# Patient Record
Sex: Female | Born: 1945
Health system: Southern US, Community
[De-identification: ages and names within clinical notes are randomized; demographics above are authoritative.]

## PROBLEM LIST (undated history)

## (undated) DIAGNOSIS — R112 Nausea with vomiting, unspecified: Secondary | ICD-10-CM

## (undated) DIAGNOSIS — F32A Depression, unspecified: Secondary | ICD-10-CM

## (undated) DIAGNOSIS — Z9889 Other specified postprocedural states: Secondary | ICD-10-CM

## (undated) DIAGNOSIS — I10 Essential (primary) hypertension: Secondary | ICD-10-CM

## (undated) DIAGNOSIS — Z973 Presence of spectacles and contact lenses: Secondary | ICD-10-CM

## (undated) DIAGNOSIS — M1990 Unspecified osteoarthritis, unspecified site: Secondary | ICD-10-CM

## (undated) DIAGNOSIS — F329 Major depressive disorder, single episode, unspecified: Secondary | ICD-10-CM

## (undated) DIAGNOSIS — K5790 Diverticulosis of intestine, part unspecified, without perforation or abscess without bleeding: Secondary | ICD-10-CM

## (undated) HISTORY — PX: CARPAL TUNNEL RELEASE: SHX101

## (undated) HISTORY — PX: KNEE ARTHROSCOPY: SUR90

---

## 1975-05-12 HISTORY — PX: DILATION AND CURETTAGE OF UTERUS: SHX78

## 1975-05-12 HISTORY — PX: TUBAL LIGATION: SHX77

## 1998-07-31 ENCOUNTER — Other Ambulatory Visit: Admission: RE | Admit: 1998-07-31 | Discharge: 1998-07-31 | Payer: Self-pay | Admitting: Obstetrics and Gynecology

## 1998-11-08 ENCOUNTER — Encounter (INDEPENDENT_AMBULATORY_CARE_PROVIDER_SITE_OTHER): Payer: Self-pay | Admitting: *Deleted

## 1998-11-08 ENCOUNTER — Other Ambulatory Visit: Admission: RE | Admit: 1998-11-08 | Discharge: 1998-11-08 | Payer: Self-pay | Admitting: Obstetrics and Gynecology

## 1999-08-01 ENCOUNTER — Other Ambulatory Visit: Admission: RE | Admit: 1999-08-01 | Discharge: 1999-08-01 | Payer: Self-pay | Admitting: Obstetrics and Gynecology

## 1999-08-19 ENCOUNTER — Encounter: Admission: RE | Admit: 1999-08-19 | Discharge: 1999-08-19 | Payer: Self-pay | Admitting: Obstetrics and Gynecology

## 1999-08-19 ENCOUNTER — Encounter: Payer: Self-pay | Admitting: Obstetrics and Gynecology

## 1999-11-26 ENCOUNTER — Ambulatory Visit (HOSPITAL_COMMUNITY): Admission: RE | Admit: 1999-11-26 | Discharge: 1999-11-26 | Payer: Self-pay | Admitting: Gastroenterology

## 2000-08-20 ENCOUNTER — Encounter: Payer: Self-pay | Admitting: Obstetrics and Gynecology

## 2000-08-20 ENCOUNTER — Encounter: Admission: RE | Admit: 2000-08-20 | Discharge: 2000-08-20 | Payer: Self-pay | Admitting: Obstetrics and Gynecology

## 2000-10-13 ENCOUNTER — Other Ambulatory Visit: Admission: RE | Admit: 2000-10-13 | Discharge: 2000-10-13 | Payer: Self-pay | Admitting: Obstetrics and Gynecology

## 2000-10-13 ENCOUNTER — Encounter (INDEPENDENT_AMBULATORY_CARE_PROVIDER_SITE_OTHER): Payer: Self-pay | Admitting: Specialist

## 2001-08-22 ENCOUNTER — Encounter: Admission: RE | Admit: 2001-08-22 | Discharge: 2001-08-22 | Payer: Self-pay | Admitting: Obstetrics and Gynecology

## 2001-08-22 ENCOUNTER — Encounter: Payer: Self-pay | Admitting: Obstetrics and Gynecology

## 2002-10-24 ENCOUNTER — Encounter: Admission: RE | Admit: 2002-10-24 | Discharge: 2002-10-24 | Payer: Self-pay | Admitting: Obstetrics and Gynecology

## 2002-10-24 ENCOUNTER — Encounter: Payer: Self-pay | Admitting: Obstetrics and Gynecology

## 2002-12-07 ENCOUNTER — Other Ambulatory Visit: Admission: RE | Admit: 2002-12-07 | Discharge: 2002-12-07 | Payer: Self-pay | Admitting: Obstetrics and Gynecology

## 2003-10-25 ENCOUNTER — Encounter: Admission: RE | Admit: 2003-10-25 | Discharge: 2003-10-25 | Payer: Self-pay | Admitting: Obstetrics and Gynecology

## 2003-12-20 ENCOUNTER — Other Ambulatory Visit: Admission: RE | Admit: 2003-12-20 | Discharge: 2003-12-20 | Payer: Self-pay | Admitting: Obstetrics and Gynecology

## 2004-12-03 ENCOUNTER — Encounter: Admission: RE | Admit: 2004-12-03 | Discharge: 2004-12-03 | Payer: Self-pay | Admitting: Obstetrics and Gynecology

## 2005-01-01 ENCOUNTER — Other Ambulatory Visit: Admission: RE | Admit: 2005-01-01 | Discharge: 2005-01-01 | Payer: Self-pay | Admitting: Obstetrics and Gynecology

## 2005-12-09 ENCOUNTER — Encounter: Admission: RE | Admit: 2005-12-09 | Discharge: 2005-12-09 | Payer: Self-pay | Admitting: Obstetrics and Gynecology

## 2006-01-06 ENCOUNTER — Other Ambulatory Visit: Admission: RE | Admit: 2006-01-06 | Discharge: 2006-01-06 | Payer: Self-pay | Admitting: Obstetrics and Gynecology

## 2006-12-15 ENCOUNTER — Encounter: Admission: RE | Admit: 2006-12-15 | Discharge: 2006-12-15 | Payer: Self-pay | Admitting: Obstetrics and Gynecology

## 2007-01-12 ENCOUNTER — Other Ambulatory Visit: Admission: RE | Admit: 2007-01-12 | Discharge: 2007-01-12 | Payer: Self-pay | Admitting: Obstetrics and Gynecology

## 2007-12-20 ENCOUNTER — Encounter: Admission: RE | Admit: 2007-12-20 | Discharge: 2007-12-20 | Payer: Self-pay | Admitting: Obstetrics and Gynecology

## 2008-01-18 ENCOUNTER — Other Ambulatory Visit: Admission: RE | Admit: 2008-01-18 | Discharge: 2008-01-18 | Payer: Self-pay | Admitting: Obstetrics and Gynecology

## 2008-03-07 ENCOUNTER — Encounter: Admission: RE | Admit: 2008-03-07 | Discharge: 2008-03-07 | Payer: Self-pay | Admitting: Orthopedic Surgery

## 2009-01-07 ENCOUNTER — Encounter: Admission: RE | Admit: 2009-01-07 | Discharge: 2009-01-07 | Payer: Self-pay | Admitting: Obstetrics and Gynecology

## 2009-01-23 ENCOUNTER — Other Ambulatory Visit: Admission: RE | Admit: 2009-01-23 | Discharge: 2009-01-23 | Payer: Self-pay | Admitting: Obstetrics and Gynecology

## 2010-01-08 ENCOUNTER — Encounter: Admission: RE | Admit: 2010-01-08 | Discharge: 2010-01-08 | Payer: Self-pay | Admitting: Family Medicine

## 2010-09-26 NOTE — Procedures (Signed)
[facility]  Patient:    Christina Zhang, Christina Zhang                       MRN: [id number] Proc. Date: 11/26/99 Adm. Date:  [id number] Disc. Date: [id number] Attending:  Deneen Harts CC:         [name]. Thomasena Edis, M.D.             Selinda Flavin, M.D.                           Procedure Report  PROCEDURE:  Flexible sigmoidoscopy.  INDICATIONS FOR PROCEDURE:  A 65 year old white female undergoing sigmoidoscopy for neoplasia surveillance. Although colonoscopy was recommended, her insurance benefits do not cover this. She is asymptomatic. There is no significant family history of colorectal neoplasia. No prior surveillance.  DESCRIPTION OF PROCEDURE:  After reviewing the nature of the procedure with the patient including potential risks and complications, and after discussing alternative methods of diagnosis and treatment informed consent was signed.  The patient was premedicated receiving Versed 7.5 mg, fentanyl 100 mcg administered IV in divided doses prior to and during the course of the procedure.  Using an Olympus PCF-140L videoscope, the rectum was intubated after a normal digital examination. The scope was advanced to 70 cm at which level the mid descending colon was reached. Preparation was excellent to this point. The scope was slowly withdrawn with carefully inspection of the distal descending colon and rectosigmoid. No abnormality was noted. Specifically there was no evidence of colorectal neoplasia. No mucosal abnormality, vascular lesion or diverticular change appreciated. Retroflexed view in the rectal vault was negative. The colon was decompressed, scope withdrawn.  The patient tolerated the procedure without difficulty being maintained on Datascope monitor, low flow oxygen throughout. Returned to recovery in stable condition.  ASSESSMENT:  Normal flexible sigmoidoscopy.  RECOMMENDATION: 1. Annual Hemoccult--just completed 4/01, reportedly  negative. 2. Air contrast barium enema. 3. Colonoscopy--5 years. DD:  11/26/99 TD:  11/28/99 Job: [id number]

## 2011-01-16 ENCOUNTER — Other Ambulatory Visit: Payer: Self-pay | Admitting: Family Medicine

## 2011-01-16 DIAGNOSIS — Z1231 Encounter for screening mammogram for malignant neoplasm of breast: Secondary | ICD-10-CM

## 2011-01-21 ENCOUNTER — Ambulatory Visit
Admission: RE | Admit: 2011-01-21 | Discharge: 2011-01-21 | Disposition: A | Discharge status: Home / Self Care | Payer: 59 | Source: Ambulatory Visit | Attending: Family Medicine | Admitting: Family Medicine

## 2011-01-21 DIAGNOSIS — Z1231 Encounter for screening mammogram for malignant neoplasm of breast: Secondary | ICD-10-CM

## 2011-12-02 ENCOUNTER — Encounter (HOSPITAL_COMMUNITY): Payer: Self-pay | Admitting: Pharmacy Technician

## 2011-12-02 ENCOUNTER — Other Ambulatory Visit: Payer: Self-pay | Admitting: Orthopedic Surgery

## 2011-12-02 MED ORDER — DEXAMETHASONE SODIUM PHOSPHATE 10 MG/ML IJ SOLN: 10.0000 mg | Freq: Once | INTRAMUSCULAR | Status: DC

## 2011-12-02 MED ORDER — BUPIVACAINE 0.25 % ON-Q PUMP SINGLE CATH 300ML: 300.0000 mL | INJECTION | Status: DC

## 2011-12-02 NOTE — Progress Notes (Signed)
Preoperative surgical orders have been place into the Epic hospital system for Christina Zhang on 12/02/2011, 7:08 AM  by Patrica Duel for surgery on 12/09/2011.  Preop Total Knee orders including Bupivacaine On-Q pump, IV Tylenol, and IV Decadron as long as there are no contraindications to the above medications. Avel Peace, PA-C

## 2011-12-02 NOTE — Patient Instructions (Signed)
20 Christina Zhang  12/02/2011   Your procedure is scheduled on:  12/09/11 215pm-320pm  Report to [facility] at 1145am  Call this number if you have problems the morning of surgery: [phone]   Remember:   Do not eat food:After Midnight.  May have clear liquids:until 0730am then npo .  Clear liquids include soda, tea, black coffee, apple or grape juice, broth.  Take these medicines the morning of surgery with A SIP OF WATER:    Do not wear jewelry, make-up or nail polish.  Do not wear lotions, powders, or perfumes.  Do not shave 48 hours prior to surgery  Do not bring valuables to the hospital.  Contacts, dentures or bridgework may not be worn into surgery.  Leave suitcase in the car. After surgery it may be brought to your room.  For patients admitted to the hospital, checkout time is 11:00 AM the day of discharge.    Special Instructions: CHG Shower Use Special Wash: 1/2 bottle night before surgery and 1/2 bottle morning of surgery. shower chin to toes with CHG.  Wash face and private parts with regular soap.    Please read over the following fact sheets that you were given: MRSA Information, Incentive Spirometry Fact sheet, Blood Transfusion Fact Sheet, coughing and deep breathing exercises, leg exercises

## 2011-12-03 ENCOUNTER — Encounter (HOSPITAL_COMMUNITY)
Admission: RE | Admit: 2011-12-03 | Discharge: 2011-12-03 | Disposition: A | Discharge status: Home / Self Care | Payer: 59 | Source: Ambulatory Visit | Attending: Orthopedic Surgery | Admitting: Orthopedic Surgery

## 2011-12-03 ENCOUNTER — Encounter (HOSPITAL_COMMUNITY): Payer: Self-pay

## 2011-12-03 ENCOUNTER — Ambulatory Visit (HOSPITAL_COMMUNITY)
Admission: RE | Admit: 2011-12-03 | Discharge: 2011-12-03 | Disposition: A | Discharge status: Home / Self Care | Payer: 59 | Source: Ambulatory Visit | Attending: Orthopedic Surgery | Admitting: Orthopedic Surgery

## 2011-12-03 DIAGNOSIS — Z01812 Encounter for preprocedural laboratory examination: Secondary | ICD-10-CM | POA: Insufficient documentation

## 2011-12-03 DIAGNOSIS — M1710 Unilateral primary osteoarthritis, unspecified knee: Secondary | ICD-10-CM | POA: Insufficient documentation

## 2011-12-03 HISTORY — DX: Major depressive disorder, single episode, unspecified: F32.9

## 2011-12-03 HISTORY — DX: Depression, unspecified: F32.A

## 2011-12-03 HISTORY — DX: Nausea with vomiting, unspecified: R11.2

## 2011-12-03 HISTORY — DX: Other specified postprocedural states: Z98.890

## 2011-12-03 HISTORY — DX: Diverticulosis of intestine, part unspecified, without perforation or abscess without bleeding: K57.90

## 2011-12-03 HISTORY — DX: Essential (primary) hypertension: I10

## 2011-12-03 LAB — URINALYSIS, ROUTINE W REFLEX MICROSCOPIC
Glucose, UA: NEGATIVE mg/dL
Nitrite: NEGATIVE
Protein, ur: NEGATIVE mg/dL
Urobilinogen, UA: 1 mg/dL (ref 0.0–1.0)

## 2011-12-03 LAB — COMPREHENSIVE METABOLIC PANEL
Albumin: 4.2 g/dL (ref 3.5–5.2)
BUN: 19 mg/dL (ref 6–23)
Chloride: 99 mEq/L (ref 96–112)
Creatinine, Ser: 0.63 mg/dL (ref 0.50–1.10)
GFR calc non Af Amer: >90 mL/min (ref >90)
Total Bilirubin: 0.3 mg/dL (ref 0.3–1.2)

## 2011-12-03 LAB — CBC
HCT: 39.9 % (ref 36.0–46.0)
MCV: 90.1 fL (ref 78.0–100.0)
RDW: 12.1 % (ref 11.5–15.5)
WBC: 7.8 10*3/uL (ref 4.0–10.5)

## 2011-12-03 LAB — PROTIME-INR: INR: 0.92 (ref 0.00–1.49)

## 2011-12-03 LAB — SURGICAL PCR SCREEN: MRSA, PCR: NEGATIVE

## 2011-12-04 NOTE — H&P (Signed)
Christina Zhang DOB: 01-25-1946  Chief Complaint: left knee pain  History of Present Illness The patient is a 66 year old female who comes in today for a preoperative History and Physical. The patient is scheduled for a left total knee arthroplasty to be performed by Dr. [name]. [name], MD at [facility] on Wednesday December 09, 2011 . She has had pain and discomfort in both knees for years. She has had previous arthroscopic surgery by Dr. [name] on the right knee and has had a couple of cortisone shots in the past which gave her a problem with facial flushing and hypertension. She had 2 different types of cortisone and had the same reaction with both. She also in quite a distance back had Visco supplementation with what sounds like Synvisc which gave her some relief, but not really good relief. Due to her continued achy discomfort in both knees, she comes in today for evaluation. She states that the pain bothers her with walking, especially going up and down steps and is starting to interfere with her activities. No locking, catching, or giving away. The left knee is bothering her more. She has failed viscosupplementation. Due to failure of conservative measures, the most predictable means for increased function and decreased pain in the left knee is a left total knee arthroplasty. Risks and benefits discussed.   Problem List/Past Medical Osteoarthritis, Knee (715.96) Depression Hypertension Diverticulitis Of Colon Measles. as a child Mumps. as a child  Allergies Cipro  Cortisone  Flagyl    Family History Cancer. sister and grandmother mothers side Congestive Heart Failure. father Diabetes Mellitus. father Osteoporosis. sister Father. deceased age 55 due to complications of CHF, DM Mother. deceased age 49 due to ALS   Social History Living situation. live with spouse Marital status. married Most recent primary occupation. Dental  Assistant Current work status. working full time Drug/Alcohol Rehab (Currently). no Illicit drug use. no Number of flights of stairs before winded. 2-3 Tobacco use. never smoker Pain Contract. no Previously in rehab. no Tobacco / smoke exposure. no Children. 3 Alcohol use. never consumed alcohol Post-Surgical Plans. home with family Advance Directives. none   Medication History Hydrochlorothiazide (12.5MG  Capsule, Oral) Active. Zoloft (50MG  Tablet, Oral) Active. Calcium Carbonate ( Oral) Specific dose unknown - Active. Fish Oil Active. Aspirin EC (81MG  Tablet DR, Oral) Active. Vitamin D3 Complete ( Oral) Active.    Past Surgical History Dilation and Curettage of Uterus Tubal Ligation Arthroscopy of Knee. right Carpal Tunnel Repair. right Colon Polyp Removal - Colonoscopy   Review of Systems General:Present- Weight Gain. Not Present- Chills, Fever, Night Sweats, Fatigue, Weight Loss and Memory Loss. Skin:Not Present- Hives, Itching, Rash, Eczema and Lesions. HEENT:Not Present- Tinnitus, Headache, Double Vision, Visual Loss, Hearing Loss and Dentures. Respiratory:Not Present- Shortness of breath with exertion, Shortness of breath at rest, Allergies, Coughing up blood and Chronic Cough. Cardiovascular:Not Present- Chest Pain, Racing/skipping heartbeats, Difficulty Breathing Lying Down, Murmur, Swelling and Palpitations. Gastrointestinal:Not Present- Bloody Stool, Heartburn, Abdominal Pain, Vomiting, Nausea, Constipation, Diarrhea, Difficulty Swallowing, Jaundice and Loss of appetitie. Female Genitourinary:Not Present- Blood in Urine, Urinary frequency, Weak urinary stream, Discharge, Flank Pain, Incontinence, Painful Urination, Urgency, Urinary Retention and Urinating at Night. Musculoskeletal:Present- Joint Swelling and Joint Pain. Not Present- Muscle Weakness, Muscle Pain, Back Pain, Morning Stiffness and Spasms. Neurological:Not Present- Tremor,  Dizziness, Blackout spells, Paralysis, Difficulty with balance and Weakness. Psychiatric:Not Present- Insomnia.   Vitals Weight: 204 lb Height: 65 in Body Surface Area: 2.06 m Body Mass Index:  33.95 kg/m Pulse: 88 (Regular) Resp.: 16 (Unlabored) BP: 144/88 (Sitting, Left Arm, Standard)    Physical Exam General Mental Status - Alert, cooperative and good historian. General Appearance- pleasant. Not in acute distress. Orientation- Oriented X3. Build & Nutrition- Overweight, Well nourished and Well developed. Head and Neck Head- normocephalic, atraumatic . Neck Global Assessment- supple. no bruit auscultated on the right and no bruit auscultated on the left. Eye Pupil- Bilateral- Regular and Round. Motion- Bilateral- EOMI. Chest and Lung Exam Auscultation: Breath sounds:- clear at anterior chest wall and - clear at posterior chest wall. Adventitious sounds:- No Adventitious sounds. Cardiovascular Auscultation:Rhythm- Regular rate and rhythm. Heart Sounds- S1 WNL and S2 WNL. Murmurs & Other Heart Sounds:Auscultation of the heart reveals - No Murmurs. Abdomen Palpation/Percussion:Tenderness- Abdomen is non-tender to palpation. Rigidity (guarding)- Abdomen is soft. Auscultation:Auscultation of the abdomen reveals - Bowel sounds normal. Female Genitourinary Not done, not pertinent to present illness Peripheral Vascular Upper Extremity: Palpation:- Pulses bilaterally normal. Lower Extremity: Palpation:- Pulses bilaterally normal. Neurologic Examination of related systems reveals - normal muscle strength and tone in all extremities. Neurologic evaluation reveals - normal sensation and upper and lower extremity deep tendon reflexes intact bilaterally . Musculoskeletal Her knees show no effusion, full range of motion, crepitation throughout the range of motion especially at the patellofemoral joint of both. On the right knee, she  has lateral joint line tenderness. On the left knee, she has medial joint line tenderness with pain on McMurray's in those areas, but no clunk. No ligamentous laxity to collateral or cruciate testing. Sensation and circulation are intact.   RADIOGRAPHS: On the right knee, x-rays show near bone on bone lateral compartment and moderate patellofemoral changes on the left knee, near bone on bone medial compartment and moderate patellofemoral changes.    Assessment & Plan Osteoarthritis, Knee (715.96) Left total knee arthoplasty      Dimitri Ped, PA-C

## 2011-12-09 ENCOUNTER — Encounter (HOSPITAL_COMMUNITY): Payer: Self-pay | Admitting: Registered Nurse

## 2011-12-09 ENCOUNTER — Ambulatory Visit (HOSPITAL_COMMUNITY): Payer: 59 | Admitting: Registered Nurse

## 2011-12-09 ENCOUNTER — Encounter (HOSPITAL_COMMUNITY): Payer: Self-pay | Admitting: *Deleted

## 2011-12-09 ENCOUNTER — Encounter (HOSPITAL_COMMUNITY)
Admission: RE | Disposition: A | Discharge status: Home Health | Payer: Self-pay | Source: Ambulatory Visit | Attending: Orthopedic Surgery

## 2011-12-09 ENCOUNTER — Inpatient Hospital Stay (HOSPITAL_COMMUNITY)
Admission: RE | Admit: 2011-12-09 | Discharge: 2011-12-12 | DRG: 470 | Disposition: A | Discharge status: Home Health | Payer: 59 | Source: Ambulatory Visit | Attending: Orthopedic Surgery | Admitting: Orthopedic Surgery

## 2011-12-09 DIAGNOSIS — Z860100 Personal history of colon polyps, unspecified: Secondary | ICD-10-CM

## 2011-12-09 DIAGNOSIS — Z888 Allergy status to other drugs, medicaments and biological substances status: Secondary | ICD-10-CM

## 2011-12-09 DIAGNOSIS — Z96659 Presence of unspecified artificial knee joint: Secondary | ICD-10-CM

## 2011-12-09 DIAGNOSIS — I10 Essential (primary) hypertension: Secondary | ICD-10-CM | POA: Diagnosis present

## 2011-12-09 DIAGNOSIS — Z8719 Personal history of other diseases of the digestive system: Secondary | ICD-10-CM

## 2011-12-09 DIAGNOSIS — M179 Osteoarthritis of knee, unspecified: Secondary | ICD-10-CM | POA: Diagnosis present

## 2011-12-09 DIAGNOSIS — Z880 Allergy status to penicillin: Secondary | ICD-10-CM

## 2011-12-09 DIAGNOSIS — F329 Major depressive disorder, single episode, unspecified: Secondary | ICD-10-CM | POA: Diagnosis present

## 2011-12-09 DIAGNOSIS — E876 Hypokalemia: Secondary | ICD-10-CM | POA: Diagnosis not present

## 2011-12-09 DIAGNOSIS — M1710 Unilateral primary osteoarthritis, unspecified knee: Secondary | ICD-10-CM | POA: Diagnosis present

## 2011-12-09 DIAGNOSIS — Z7982 Long term (current) use of aspirin: Secondary | ICD-10-CM

## 2011-12-09 DIAGNOSIS — F3289 Other specified depressive episodes: Secondary | ICD-10-CM | POA: Diagnosis present

## 2011-12-09 HISTORY — PX: TOTAL KNEE ARTHROPLASTY: SHX125

## 2011-12-09 LAB — TYPE AND SCREEN
ABO/RH(D): A POS
Antibody Screen: NEGATIVE

## 2011-12-09 LAB — ABO/RH: ABO/RH(D): A POS

## 2011-12-09 SURGERY — ARTHROPLASTY, KNEE, TOTAL
Anesthesia: Spinal | Site: Knee | Laterality: Left | Wound class: Clean

## 2011-12-09 MED ORDER — FENTANYL CITRATE 0.05 MG/ML IJ SOLN
INTRAMUSCULAR | Status: DC | PRN
  Administered 2011-12-09: 25 ug via INTRAVENOUS
  Administered 2011-12-09: 50 ug via INTRAVENOUS

## 2011-12-09 MED ORDER — ACETAMINOPHEN 10 MG/ML IV SOLN
INTRAVENOUS | Status: AC
  Filled 2011-12-09: qty 100

## 2011-12-09 MED ORDER — KCL IN DEXTROSE-NACL 20-5-0.9 MEQ/L-%-% IV SOLN
INTRAVENOUS | Status: AC
  Filled 2011-12-09: qty 1000

## 2011-12-09 MED ORDER — METOCLOPRAMIDE HCL 5 MG/ML IJ SOLN
5.0000 mg | Freq: Three times a day (TID) | INTRAMUSCULAR | Status: DC | PRN
  Administered 2011-12-09: 10 mg via INTRAVENOUS
  Filled 2011-12-09: qty 2

## 2011-12-09 MED ORDER — SODIUM CHLORIDE 0.9 % IV SOLN: INTRAVENOUS | Status: DC

## 2011-12-09 MED ORDER — CEFAZOLIN SODIUM-DEXTROSE 2-3 GM-% IV SOLR
INTRAVENOUS | Status: AC
  Filled 2011-12-09: qty 50

## 2011-12-09 MED ORDER — PHENOL 1.4 % MT LIQD: 1.0000 | OROMUCOSAL | Status: DC | PRN

## 2011-12-09 MED ORDER — BUPIVACAINE ON-Q PAIN PUMP (FOR ORDER SET NO CHG)
INJECTION | Status: DC
  Filled 2011-12-09: qty 1

## 2011-12-09 MED ORDER — BUPIVACAINE 0.25 % ON-Q PUMP SINGLE CATH 300ML
INJECTION | Status: AC
  Filled 2011-12-09: qty 300

## 2011-12-09 MED ORDER — BUPIVACAINE IN DEXTROSE 0.75-8.25 % IT SOLN
INTRATHECAL | Status: DC | PRN
  Administered 2011-12-09: 1.8 mL via INTRATHECAL

## 2011-12-09 MED ORDER — ONDANSETRON HCL 4 MG/2ML IJ SOLN
INTRAMUSCULAR | Status: DC | PRN
  Administered 2011-12-09: 4 mg via INTRAVENOUS

## 2011-12-09 MED ORDER — MIDAZOLAM HCL 5 MG/5ML IJ SOLN
INTRAMUSCULAR | Status: DC | PRN
  Administered 2011-12-09: 2 mg via INTRAVENOUS

## 2011-12-09 MED ORDER — ONDANSETRON HCL 4 MG PO TABS
4.0000 mg | ORAL_TABLET | Freq: Four times a day (QID) | ORAL | Status: DC | PRN
  Administered 2011-12-12: 4 mg via ORAL
  Filled 2011-12-09: qty 1

## 2011-12-09 MED ORDER — ACETAMINOPHEN 10 MG/ML IV SOLN
1000.0000 mg | Freq: Once | INTRAVENOUS | Status: AC
  Administered 2011-12-09: 1000 mg via INTRAVENOUS

## 2011-12-09 MED ORDER — ACETAMINOPHEN 10 MG/ML IV SOLN
1000.0000 mg | Freq: Four times a day (QID) | INTRAVENOUS | Status: AC
  Administered 2011-12-09 – 2011-12-10 (×4): 1000 mg via INTRAVENOUS
  Filled 2011-12-09 (×5): qty 100

## 2011-12-09 MED ORDER — MENTHOL 3 MG MT LOZG: 1.0000 | LOZENGE | OROMUCOSAL | Status: DC | PRN

## 2011-12-09 MED ORDER — KCL IN DEXTROSE-NACL 20-5-0.9 MEQ/L-%-% IV SOLN
INTRAVENOUS | Status: DC
  Administered 2011-12-09: 75 mL via INTRAVENOUS
  Administered 2011-12-10: 10:00:00 via INTRAVENOUS
  Filled 2011-12-09 (×2): qty 1000

## 2011-12-09 MED ORDER — METHOCARBAMOL 500 MG PO TABS
500.0000 mg | ORAL_TABLET | Freq: Four times a day (QID) | ORAL | Status: DC | PRN
  Administered 2011-12-09 – 2011-12-10 (×2): 500 mg via ORAL
  Filled 2011-12-09 (×3): qty 1

## 2011-12-09 MED ORDER — LACTATED RINGERS IV SOLN
INTRAVENOUS | Status: DC
  Administered 2011-12-09 (×2): 1000 mL via INTRAVENOUS

## 2011-12-09 MED ORDER — DIPHENHYDRAMINE HCL 12.5 MG/5ML PO ELIX: 12.5000 mg | ORAL_SOLUTION | ORAL | Status: DC | PRN

## 2011-12-09 MED ORDER — POLYETHYLENE GLYCOL 3350 17 G PO PACK
17.0000 g | PACK | Freq: Every day | ORAL | Status: DC | PRN
  Filled 2011-12-09: qty 1

## 2011-12-09 MED ORDER — METHOCARBAMOL 100 MG/ML IJ SOLN
500.0000 mg | Freq: Four times a day (QID) | INTRAVENOUS | Status: DC | PRN
  Administered 2011-12-10: 500 mg via INTRAVENOUS
  Filled 2011-12-09 (×2): qty 5

## 2011-12-09 MED ORDER — OXYCODONE HCL 5 MG PO TABS
5.0000 mg | ORAL_TABLET | ORAL | Status: DC | PRN
  Administered 2011-12-09 – 2011-12-12 (×10): 10 mg via ORAL
  Filled 2011-12-09 (×10): qty 2

## 2011-12-09 MED ORDER — CEFAZOLIN SODIUM 1-5 GM-% IV SOLN
1.0000 g | Freq: Four times a day (QID) | INTRAVENOUS | Status: AC
  Administered 2011-12-09 – 2011-12-10 (×2): 1 g via INTRAVENOUS
  Filled 2011-12-09 (×2): qty 50

## 2011-12-09 MED ORDER — ACETAMINOPHEN 325 MG PO TABS: 650.0000 mg | ORAL_TABLET | Freq: Four times a day (QID) | ORAL | Status: DC | PRN

## 2011-12-09 MED ORDER — PROPOFOL 10 MG/ML IV EMUL
INTRAVENOUS | Status: DC | PRN
  Administered 2011-12-09: 75 ug/kg/min via INTRAVENOUS

## 2011-12-09 MED ORDER — PROMETHAZINE HCL 25 MG/ML IJ SOLN: 6.2500 mg | INTRAMUSCULAR | Status: DC | PRN

## 2011-12-09 MED ORDER — EPHEDRINE SULFATE 50 MG/ML IJ SOLN
INTRAMUSCULAR | Status: DC | PRN
  Administered 2011-12-09: 5 mg via INTRAVENOUS

## 2011-12-09 MED ORDER — SERTRALINE HCL 50 MG PO TABS
50.0000 mg | ORAL_TABLET | Freq: Every morning | ORAL | Status: DC
  Administered 2011-12-10 – 2011-12-12 (×3): 50 mg via ORAL
  Filled 2011-12-09 (×3): qty 1

## 2011-12-09 MED ORDER — LACTATED RINGERS IV SOLN: INTRAVENOUS | Status: DC

## 2011-12-09 MED ORDER — RIVAROXABAN 10 MG PO TABS
10.0000 mg | ORAL_TABLET | Freq: Every day | ORAL | Status: DC
  Administered 2011-12-10 – 2011-12-12 (×3): 10 mg via ORAL
  Filled 2011-12-09 (×5): qty 1

## 2011-12-09 MED ORDER — CHLORHEXIDINE GLUCONATE 4 % EX LIQD
60.0000 mL | Freq: Once | CUTANEOUS | Status: DC
  Filled 2011-12-09: qty 60

## 2011-12-09 MED ORDER — TRAMADOL HCL 50 MG PO TABS: 50.0000 mg | ORAL_TABLET | Freq: Four times a day (QID) | ORAL | Status: DC | PRN

## 2011-12-09 MED ORDER — TEMAZEPAM 15 MG PO CAPS: 15.0000 mg | ORAL_CAPSULE | Freq: Every evening | ORAL | Status: DC | PRN

## 2011-12-09 MED ORDER — HYDROMORPHONE HCL PF 1 MG/ML IJ SOLN: 0.2500 mg | INTRAMUSCULAR | Status: DC | PRN

## 2011-12-09 MED ORDER — DOCUSATE SODIUM 100 MG PO CAPS
100.0000 mg | ORAL_CAPSULE | Freq: Two times a day (BID) | ORAL | Status: DC
  Administered 2011-12-09 – 2011-12-12 (×6): 100 mg via ORAL
  Filled 2011-12-09 (×3): qty 1

## 2011-12-09 MED ORDER — LIDOCAINE HCL (CARDIAC) 20 MG/ML IV SOLN
INTRAVENOUS | Status: DC | PRN
  Administered 2011-12-09: 100 mg via INTRAVENOUS

## 2011-12-09 MED ORDER — FLEET ENEMA 7-19 GM/118ML RE ENEM: 1.0000 | ENEMA | Freq: Once | RECTAL | Status: AC | PRN

## 2011-12-09 MED ORDER — ACETAMINOPHEN 650 MG RE SUPP: 650.0000 mg | Freq: Four times a day (QID) | RECTAL | Status: DC | PRN

## 2011-12-09 MED ORDER — BUPIVACAINE 0.25 % ON-Q PUMP SINGLE CATH 300ML
INJECTION | Status: DC | PRN
  Administered 2011-12-09: 300 mL

## 2011-12-09 MED ORDER — CEFAZOLIN SODIUM 1-5 GM-% IV SOLN
3.0000 g | INTRAVENOUS | Status: AC
  Administered 2011-12-09: 2 g via INTRAVENOUS

## 2011-12-09 MED ORDER — ONDANSETRON HCL 4 MG/2ML IJ SOLN
4.0000 mg | Freq: Four times a day (QID) | INTRAMUSCULAR | Status: DC | PRN
  Administered 2011-12-09 – 2011-12-10 (×2): 4 mg via INTRAVENOUS
  Filled 2011-12-09 (×2): qty 2

## 2011-12-09 MED ORDER — HYDROCHLOROTHIAZIDE 25 MG PO TABS
12.5000 mg | ORAL_TABLET | Freq: Every morning | ORAL | Status: DC
  Filled 2011-12-09: qty 0.5

## 2011-12-09 MED ORDER — METOCLOPRAMIDE HCL 10 MG PO TABS: 5.0000 mg | ORAL_TABLET | Freq: Three times a day (TID) | ORAL | Status: DC | PRN

## 2011-12-09 MED ORDER — MORPHINE SULFATE 2 MG/ML IJ SOLN
2.0000 mg | INTRAMUSCULAR | Status: DC | PRN
  Administered 2011-12-10 (×3): 2 mg via INTRAVENOUS
  Filled 2011-12-09 (×3): qty 1

## 2011-12-09 MED ORDER — 0.9 % SODIUM CHLORIDE (POUR BTL) OPTIME
TOPICAL | Status: DC | PRN
  Administered 2011-12-09: 1000 mL

## 2011-12-09 MED ORDER — BISACODYL 10 MG RE SUPP: 10.0000 mg | Freq: Every day | RECTAL | Status: DC | PRN

## 2011-12-09 SURGICAL SUPPLY — 55 items
BAG SPEC THK2 15X12 ZIP CLS (MISCELLANEOUS) ×1
BAG ZIPLOCK 12X15 (MISCELLANEOUS) ×2 IMPLANT
BANDAGE ELASTIC 6 VELCRO ST LF (GAUZE/BANDAGES/DRESSINGS) ×2 IMPLANT
BANDAGE ESMARK 6X9 LF (GAUZE/BANDAGES/DRESSINGS) ×1 IMPLANT
BLADE SAG 18X100X1.27 (BLADE) ×2 IMPLANT
BLADE SAW SGTL 11.0X1.19X90.0M (BLADE) ×2 IMPLANT
BNDG CMPR 9X6 STRL LF SNTH (GAUZE/BANDAGES/DRESSINGS) ×1
BNDG ESMARK 6X9 LF (GAUZE/BANDAGES/DRESSINGS) ×2
BOWL SMART MIX CTS (DISPOSABLE) ×2 IMPLANT
CATH KIT ON-Q SILVERSOAK 5 (CATHETERS) ×1 IMPLANT
CATH KIT ON-Q SILVERSOAK 5IN (CATHETERS) ×2 IMPLANT
CEMENT HV SMART SET (Cement) ×4 IMPLANT
CLOTH BEACON ORANGE TIMEOUT ST (SAFETY) ×2 IMPLANT
CLSR STERI-STRIP ANTIMIC 1/2X4 (GAUZE/BANDAGES/DRESSINGS) ×1 IMPLANT
CUFF TOURN SGL QUICK 34 (TOURNIQUET CUFF) ×2
CUFF TRNQT CYL 34X4X40X1 (TOURNIQUET CUFF) ×1 IMPLANT
DRAPE EXTREMITY T 121X128X90 (DRAPE) ×2 IMPLANT
DRAPE POUCH INSTRU U-SHP 10X18 (DRAPES) ×2 IMPLANT
DRAPE U-SHAPE 47X51 STRL (DRAPES) ×2 IMPLANT
DRSG ADAPTIC 3X8 NADH LF (GAUZE/BANDAGES/DRESSINGS) ×2 IMPLANT
DRSG PAD ABDOMINAL 8X10 ST (GAUZE/BANDAGES/DRESSINGS) ×1 IMPLANT
DURAPREP 26ML APPLICATOR (WOUND CARE) ×2 IMPLANT
ELECT REM PT RETURN 9FT ADLT (ELECTROSURGICAL) ×2
ELECTRODE REM PT RTRN 9FT ADLT (ELECTROSURGICAL) ×1 IMPLANT
EVACUATOR 1/8 PVC DRAIN (DRAIN) ×2 IMPLANT
FACESHIELD LNG OPTICON STERILE (SAFETY) ×10 IMPLANT
GLOVE BIO SURGEON STRL SZ7.5 (GLOVE) ×2 IMPLANT
GLOVE BIO SURGEON STRL SZ8 (GLOVE) ×2 IMPLANT
GLOVE BIOGEL PI IND STRL 8 (GLOVE) ×2 IMPLANT
GLOVE BIOGEL PI INDICATOR 8 (GLOVE) ×2
GOWN STRL NON-REIN LRG LVL3 (GOWN DISPOSABLE) ×2 IMPLANT
GOWN STRL REIN XL XLG (GOWN DISPOSABLE) ×2 IMPLANT
HANDPIECE INTERPULSE COAX TIP (DISPOSABLE) ×2
IMMOBILIZER KNEE 20 (SOFTGOODS) ×2
IMMOBILIZER KNEE 20 THIGH 36 (SOFTGOODS) ×1 IMPLANT
KIT BASIN OR (CUSTOM PROCEDURE TRAY) ×2 IMPLANT
MANIFOLD NEPTUNE II (INSTRUMENTS) ×2 IMPLANT
NS IRRIG 1000ML POUR BTL (IV SOLUTION) ×2 IMPLANT
PACK TOTAL JOINT (CUSTOM PROCEDURE TRAY) ×2 IMPLANT
PAD ABD 7.5X8 STRL (GAUZE/BANDAGES/DRESSINGS) ×2 IMPLANT
PADDING CAST COTTON 6X4 STRL (CAST SUPPLIES) ×4 IMPLANT
POSITIONER SURGICAL ARM (MISCELLANEOUS) ×2 IMPLANT
SET HNDPC FAN SPRY TIP SCT (DISPOSABLE) ×1 IMPLANT
SPONGE GAUZE 4X4 12PLY (GAUZE/BANDAGES/DRESSINGS) ×2 IMPLANT
STRIP CLOSURE SKIN 1/2X4 (GAUZE/BANDAGES/DRESSINGS) ×4 IMPLANT
SUCTION FRAZIER 12FR DISP (SUCTIONS) ×2 IMPLANT
SUT MNCRL AB 4-0 PS2 18 (SUTURE) ×2 IMPLANT
SUT PDS AB 1 CT1 27 (SUTURE) ×6 IMPLANT
SUT VIC AB 2-0 CT1 27 (SUTURE) ×6
SUT VIC AB 2-0 CT1 TAPERPNT 27 (SUTURE) ×3 IMPLANT
SUT VLOC 180 0 24IN GS25 (SUTURE) ×2 IMPLANT
TOWEL OR 17X26 10 PK STRL BLUE (TOWEL DISPOSABLE) ×4 IMPLANT
TRAY FOLEY CATH 14FRSI W/METER (CATHETERS) ×2 IMPLANT
WATER STERILE IRR 1500ML POUR (IV SOLUTION) ×2 IMPLANT
WRAP KNEE MAXI GEL POST OP (GAUZE/BANDAGES/DRESSINGS) ×4 IMPLANT

## 2011-12-09 NOTE — Preoperative (Signed)
Beta Blockers   Reason not to administer Beta Blockers:Not Applicable 

## 2011-12-09 NOTE — Op Note (Signed)
Pre-operative diagnosis- Osteoarthritis  Left knee(s)  Post-operative diagnosis- Osteoarthritis Left knee(s)  Procedure-  Left  Total Knee Arthroplasty  Surgeon- [name]. Kion Huntsberry, MD  Assistant- Avel Peace, PA-C   Anesthesia-  Spinal EBL-* No blood loss amount entered *  Drains Hemovac  Tourniquet time-  Total Tourniquet Time Documented: Thigh (Left) - 31 minutes   Complications- None  Condition-PACU - hemodynamically stable.   Brief Clinical Note  Christina Zhang is a 66 y.o. year old female with end stage OA of her left knee with progressively worsening pain and dysfunction. She has constant pain, with activity and at rest and significant functional deficits with difficulties even with ADLs. She has had extensive non-op management including analgesics, injections of cortisone and viscosupplements, and home exercise program, but remains in significant pain with significant dysfunction. Radiographs show bone on bone arthritis medial and patellofemoral. She presents now for left Total Knee Arthroplasty.    Procedure in detail---   The patient is brought into the operating room and positioned supine on the operating table. After successful administration of  Spinal,   a tourniquet is placed high on the  Left thigh(s) and the lower extremity is prepped and draped in the usual sterile fashion. Time out is performed by the operating team and then the  Left lower extremity is wrapped in Esmarch, knee flexed and the tourniquet inflated to 300 mmHg.       A midline incision is made with a ten blade through the subcutaneous tissue to the level of the extensor mechanism. A fresh blade is used to make a medial parapatellar arthrotomy. Soft tissue over the proximal medial tibia is subperiosteally elevated to the joint line with a knife and into the semimembranosus bursa with a Cobb elevator. Soft tissue over the proximal lateral tibia is elevated with attention being paid to avoiding the patellar  tendon on the tibial tubercle. The patella is everted, knee flexed 90 degrees and the ACL and PCL are removed. Findings are bone on bone medial and patellofemoral with large medial osteophytes.        The drill is used to create a starting hole in the distal femur and the canal is thoroughly irrigated with sterile saline to remove the fatty contents. The 5 degree Left  valgus alignment guide is placed into the femoral canal and the distal femoral cutting block is pinned to remove 10 mm off the distal femur. Resection is made with an oscillating saw.      The tibia is subluxed forward and the menisci are removed. The extramedullary alignment guide is placed referencing proximally at the medial aspect of the tibial tubercle and distally along the second metatarsal axis and tibial crest. The block is pinned to remove 2mm off the more deficient medial  side. Resection is made with an oscillating saw. Size 2.5is the most appropriate size for the tibia and the proximal tibia is prepared with the modular drill and keel punch for that size.      The femoral sizing guide is placed and size 2.5 is most appropriate. Rotation is marked off the epicondylar axis and confirmed by creating a rectangular flexion gap at 90 degrees. The size 2.5 cutting block is pinned in this rotation and the anterior, posterior and chamfer cuts are made with the oscillating saw. The intercondylar block is then placed and that cut is made.      Trial size 2.5 tibial component, trial size 2.5 posterior stabilized femur and a 12.5  mm posterior stabilized rotating platform insert trial is placed. Full extension is achieved with excellent varus/valgus and anterior/posterior balance throughout full range of motion. The patella is everted and thickness measured to be 20  mm. Free hand resection is taken to 12 mm, a 35 template is placed, lug holes are drilled, trial patella is placed, and it tracks normally. Osteophytes are removed off the posterior  femur with the trial in place. All trials are removed and the cut bone surfaces prepared with pulsatile lavage. Cement is mixed and once ready for implantation, the size 2.5 tibial implant, size  2.5 posterior stabilized femoral component, and the size 35 patella are cemented in place and the patella is held with the clamp. The trial insert is placed and the knee held in full extension. All extruded cement is removed and once the cement is hard the permanent 12.5 mm posterior stabilized rotating platform insert is placed into the tibial tray.      The wound is copiously irrigated with saline solution and the extensor mechanism closed over a hemovac drain with #1 PDS suture. The tourniquet is released for a total tourniquet time of 30  minutes. Flexion against gravity is 140 degrees and the patella tracks normally. Subcutaneous tissue is closed with 2.0 vicryl and subcuticular with running 4.0 Monocryl. The catheter for the Marcaine pain pump is placed and the pump is initiated. The incision is cleaned and dried and steri-strips and a bulky sterile dressing are applied. The limb is placed into a knee immobilizer and the patient is awakened and transported to recovery in stable condition.      Please note that a surgical assistant was a medical necessity for this procedure in order to perform it in a safe and expeditious manner. Surgical assistant was necessary to retract the ligaments and vital neurovascular structures to prevent injury to them and also necessary for proper positioning of the limb to allow for anatomic placement of the prosthesis.   [name] Aleyah Balik, MD    12/09/2011, 6:10 PM

## 2011-12-09 NOTE — Progress Notes (Signed)
Pt has finished taking Keflex for UTI

## 2011-12-09 NOTE — Anesthesia Procedure Notes (Signed)
Spinal  Patient location during procedure: OR Start time: 12/09/2011 5:03 PM End time: 12/09/2011 5:07 PM Staffing Anesthesiologist: Lucille Passy F Performed by: anesthesiologist  Preanesthetic Checklist Completed: patient identified, site marked, surgical consent, pre-op evaluation, timeout performed, IV checked, risks and benefits discussed and monitors and equipment checked Spinal Block Patient position: sitting Prep: Betadine Patient monitoring: heart rate, continuous pulse ox and blood pressure Approach: midline Location: L3-4 Injection technique: single-shot Needle Needle type: Quincke  Needle gauge: 22 G Needle length: 9 cm Additional Notes Expiration date of kit checked and confirmed. Patient tolerated procedure well, without complications.

## 2011-12-09 NOTE — Interval H&P Note (Signed)
History and Physical Interval Note:  12/09/2011 2:27 PM  Christina Zhang  has presented today for surgery, with the diagnosis of Osteoarthritis of the Left Knee  The various methods of treatment have been discussed with the patient and family. After consideration of risks, benefits and other options for treatment, the patient has consented to  Procedure(s) (LRB): TOTAL KNEE ARTHROPLASTY (Left) as a surgical intervention .  The patient's history has been reviewed, patient examined, no change in status, stable for surgery.  I have reviewed the patient's chart and labs.  Questions were answered to the patient's satisfaction.     Loanne Drilling

## 2011-12-09 NOTE — Anesthesia Preprocedure Evaluation (Addendum)
Anesthesia Evaluation  Patient identified by MRN, date of birth, ID band Patient awake    Reviewed: Allergy & Precautions, H&P , NPO status , Patient's Chart, lab work & pertinent test results  History of Anesthesia Complications (+) PONV and AWARENESS UNDER ANESTHESIA  Airway Mallampati: II TM Distance: >3 FB Neck ROM: Full    Dental  (+) Teeth Intact, Dental Advisory Given and Caps   Pulmonary neg pulmonary ROS,  breath sounds clear to auscultation  Pulmonary exam normal       Cardiovascular hypertension, Rhythm:Regular Rate:Normal     Neuro/Psych PSYCHIATRIC DISORDERS Depression negative neurological ROS     GI/Hepatic negative GI ROS, Neg liver ROS,   Endo/Other  negative endocrine ROS  Renal/GU negative Renal ROS  negative genitourinary   Musculoskeletal negative musculoskeletal ROS (+)   Abdominal   Peds  Hematology negative hematology ROS (+)   Anesthesia Other Findings   Reproductive/Obstetrics negative OB ROS                          Anesthesia Physical Anesthesia Plan  ASA: II  Anesthesia Plan: Spinal   Post-op Pain Management:    Induction:   Airway Management Planned: Simple Face Mask  Additional Equipment:   Intra-op Plan:   Post-operative Plan: Extubation in OR  Informed Consent: I have reviewed the patients History and Physical, chart, labs and discussed the procedure including the risks, benefits and alternatives for the proposed anesthesia with the patient or authorized representative who has indicated his/her understanding and acceptance.   Dental advisory given  Plan Discussed with: CRNA  Anesthesia Plan Comments:         Anesthesia Quick Evaluation

## 2011-12-09 NOTE — Transfer of Care (Signed)
Immediate Anesthesia Transfer of Care Note  Patient: Christina Zhang  Procedure(s) Performed: Procedure(s) (LRB): TOTAL KNEE ARTHROPLASTY (Left)  Patient Location: PACU  Anesthesia Type: Spinal  Level of Consciousness: sedated  Airway & Oxygen Therapy: Patient Spontanous Breathing and Patient connected to face mask oxygen  Post-op Assessment: Report given to PACU RN and Post -op Vital signs reviewed and stable  Post vital signs: Reviewed and stable  Complications: No apparent anesthesia complications

## 2011-12-09 NOTE — Anesthesia Postprocedure Evaluation (Signed)
Anesthesia Post Note  Patient: Christina Zhang  Procedure(s) Performed: Procedure(s) (LRB): TOTAL KNEE ARTHROPLASTY (Left)  Anesthesia type: Spinal  Patient location: PACU  Post pain: Pain level controlled  Post assessment: Post-op Vital signs reviewed  Last Vitals:  Filed Vitals:   12/09/11 1926  BP:   Pulse: 60  Temp:   Resp: 14    Post vital signs: Reviewed  Level of consciousness: sedated  Complications: No apparent anesthesia complications

## 2011-12-10 LAB — BASIC METABOLIC PANEL
Calcium: 8.6 mg/dL (ref 8.4–10.5)
Creatinine, Ser: 0.64 mg/dL (ref 0.50–1.10)
GFR calc Af Amer: >90 mL/min (ref >90)
GFR calc non Af Amer: >90 mL/min (ref >90)
Sodium: 138 mEq/L (ref 135–145)

## 2011-12-10 LAB — CBC
MCH: 30.8 pg (ref 26.0–34.0)
MCV: 91.2 fL (ref 78.0–100.0)
Platelets: 298 10*3/uL (ref 150–400)
RBC: 3.54 MIL/uL — ABNORMAL LOW (ref 3.87–5.11)
RDW: 12.2 % (ref 11.5–15.5)

## 2011-12-10 MED ORDER — HYDROCHLOROTHIAZIDE 12.5 MG PO CAPS
12.5000 mg | ORAL_CAPSULE | Freq: Every day | ORAL | Status: DC
  Administered 2011-12-10 – 2011-12-12 (×3): 12.5 mg via ORAL
  Filled 2011-12-10 (×3): qty 1

## 2011-12-10 NOTE — Evaluation (Signed)
Physical Therapy Evaluation Patient Details Name: Christina Zhang MRN: [id number] DOB: 04-07-1946 Today's Date: 12/10/2011 Time: 4098-1191 PT Time Calculation (min): 15 min  PT Assessment / Plan / Recommendation Clinical Impression  Pt s/p L TKR.  Pt would benefit from acute PT services in order to improve independence with transfers, ambulation, and stairs prior to d/c home with spouse and family/friends to assist as needed.  Pt unable to ambulate on evaluation due to nausea.    PT Assessment  Patient needs continued PT services    Follow Up Recommendations  Home health PT    Barriers to Discharge        Equipment Recommendations  None recommended by PT    Recommendations for Other Services     Frequency 7X/week    Precautions / Restrictions Precautions Precautions: Knee Required Braces or Orthoses: Knee Immobilizer - Left Knee Immobilizer - Left: Discontinue once straight leg raise with < 10 degree lag Restrictions Weight Bearing Restrictions: Yes LLE Weight Bearing: Weight bearing as tolerated   Pertinent Vitals/Pain 6/10 L knee pain, premedicated, repositioned and ice applied, pt reported increased nausea with transfers limiting ambulation       Mobility  Bed Mobility Bed Mobility: Supine to Sit Supine to Sit: 5: Supervision;HOB elevated Details for Bed Mobility Assistance: increased time, verbal cues for technique Transfers Transfers: Stand to Sit;Sit to Stand Sit to Stand: 3: Mod assist;With upper extremity assist;From bed Stand to Sit: To chair/3-in-1;4: Min assist Details for Transfer Assistance: +2 for safety as pt reported feeling nauseated from meds, verbal cues for safe technique, L LE forward, hand placement Ambulation/Gait Ambulation/Gait Assistance: Not tested (comment) (pt reported too nauseated to ambulate)    Exercises     PT Diagnosis: Difficulty walking;Acute pain  PT Problem List: Decreased strength;Decreased range of motion;Decreased activity  tolerance;Decreased mobility;Decreased knowledge of precautions;Decreased knowledge of use of DME;Pain PT Treatment Interventions: DME instruction;Gait training;Stair training;Functional mobility training;Therapeutic activities;Therapeutic exercise;Patient/family education   PT Goals Acute Rehab PT Goals PT Goal Formulation: With patient Time For Goal Achievement: 12/17/11 Potential to Achieve Goals: Good Pt will go Supine/Side to Sit: with modified independence PT Goal: Supine/Side to Sit - Progress: Goal set today Pt will go Sit to Supine/Side: with modified independence PT Goal: Sit to Supine/Side - Progress: Goal set today Pt will go Sit to Stand: with modified independence PT Goal: Sit to Stand - Progress: Goal set today Pt will go Stand to Sit: with modified independence PT Goal: Stand to Sit - Progress: Goal set today Pt will Ambulate: 51 - 150 feet;with modified independence;with least restrictive assistive device PT Goal: Ambulate - Progress: Goal set today Pt will Go Up / Down Stairs: 6-9 stairs;with supervision;with rail(s) PT Goal: Up/Down Stairs - Progress: Goal set today Pt will Perform Home Exercise Program: with supervision, verbal cues required/provided PT Goal: Perform Home Exercise Program - Progress: Goal set today  Visit Information  Last PT Received On: 12/10/11 Assistance Needed: +2    Subjective Data  Subjective: I think it's the pain meds and pain that make me nauseous.   Prior Functioning  Home Living Lives With: Spouse Available Help at Discharge: Family;Friend(s);Available 24 hours/day Type of Home: House Home Access: Stairs to enter [employer] of Steps: 2 Home Layout: Two level;Bed/bath upstairs Alternate Level Stairs-Number of Steps: 6 Alternate Level Stairs-Rails: Can reach both Home Adaptive Equipment: Straight cane;Walker - rolling Prior Function Level of Independence: Independent Communication Communication: No difficulties      Cognition  Overall  Cognitive Status: Appears within functional limits for tasks assessed/performed Arousal/Alertness: Awake/alert Orientation Level: Appears intact for tasks assessed Behavior During Session: [facility] for tasks performed    Extremity/Trunk Assessment Right Upper Extremity Assessment RUE ROM/Strength/Tone: [facility] for tasks assessed Left Upper Extremity Assessment LUE ROM/Strength/Tone: WFL for tasks assessed Right Lower Extremity Assessment RLE ROM/Strength/Tone: [facility] for tasks assessed Left Lower Extremity Assessment LLE ROM/Strength/Tone: Deficits LLE ROM/Strength/Tone Deficits: good quad contraction, kept in KI   Balance    End of Session PT - End of Session Equipment Utilized During Treatment: Left knee immobilizer Activity Tolerance: Other (comment) (limited by nausea) Patient left: in chair;with call bell/phone within reach;with family/visitor present CPM Left Knee CPM Left Knee: Off  GP     Christina Zhang,Christina Zhang 12/10/2011, 9:31 AM Pager: [phone]

## 2011-12-10 NOTE — Progress Notes (Signed)
Order received for HHPT services. Options presented to patient and she chose [facility]. Spoke with Clydie Braun at [facility], [place], she accepted the referral and she stated they can start services on Sunday 8/4.

## 2011-12-10 NOTE — Progress Notes (Signed)
Report called in to [facility] on [facility] [address] for patient transfer.

## 2011-12-10 NOTE — Progress Notes (Signed)
   Subjective: 1 Day Post-Op Procedure(s) (LRB): TOTAL KNEE ARTHROPLASTY (Left) Patient reports pain as mild.   Patient seen in rounds by Dr. Lequita Halt. Patient is well, and has had no acute complaints or problems We will start therapy today.  Plan is to go Home after hospital stay.  Objective: Vital signs in last 24 hours: Temp:  [97.4 F (36.3 C)-98.3 F (36.8 C)] 97.6 F (36.4 C) (08/01 0102) Pulse Rate:  [60-80] 80  (08/01 0608) Resp:  [11-18] 18  (08/01 0608) BP: (112-140)/(59-79) 112/72 mmHg (08/01 0608) SpO2:  [100 %] 100 % (08/01 0608) Weight:  [93.486 kg (206 lb 1.6 oz)] 93.486 kg (206 lb 1.6 oz) (07/31 2130)  Intake/Output from previous day:  Intake/Output Summary (Last 24 hours) at 12/10/11 0829 Last data filed at 12/10/11 0600  Gross per 24 hour  Intake   2760 ml  Output   2095 ml  Net    665 ml    Intake/Output this shift:    Labs:  Basename 12/10/11 0447  HGB 10.9*    Basename 12/10/11 0447  WBC 10.0  RBC 3.54*  HCT 32.3*  PLT 298    Basename 12/10/11 0447  NA 138  K 3.6  CL 105  CO2 27  BUN 10  CREATININE 0.64  GLUCOSE 164*  CALCIUM 8.6   No results found for this basename: LABPT:2,INR:2 in the last 72 hours  EXAM General - Patient is Alert, Appropriate and Oriented Extremity - Neurovascular intact Sensation intact distally Dorsiflexion/Plantar flexion intact Motor Function - intact, moving foot and toes well on exam.  Hemovac pulled without difficulty.  Past Medical History  Diagnosis Date  . PONV (postoperative nausea and vomiting)   . Hypertension   . Depression   . Arthritis   . Diverticulosis     Assessment/Plan: 1 Day Post-Op Procedure(s) (LRB): TOTAL KNEE ARTHROPLASTY (Left) Principal Problem:  *OA (osteoarthritis) of knee   Advance diet Up with therapy Continue foley due to strict I&O and urinary output monitoring Discharge home with home health  DVT Prophylaxis - Xarelto Weight-Bearing as tolerated to left  leg No vaccines. D/C O2 and Pulse OX and try on Room Air  PERKINS, Marlowe Sax 12/10/2011, 8:29 AM

## 2011-12-10 NOTE — Progress Notes (Addendum)
Physical Therapy Treatment Note   12/10/11 1400  PT Visit Information  Last PT Received On 12/10/11  Assistance Needed +2  PT Time Calculation  PT Start Time 1312  PT Stop Time 1327  PT Time Calculation (min) 15 min  Subjective Data  Subjective I just got back to bed.  (agreeable to exercises in bed)  Precautions  Precautions Knee  Required Braces or Orthoses Knee Immobilizer - Left  Knee Immobilizer - Left Discontinue once straight leg raise with < 10 degree lag  Restrictions  LLE Weight Bearing WBAT  Cognition  Overall Cognitive Status Appears within functional limits for tasks assessed/performed  Total Joint Exercises  Ankle Circles/Pumps AROM;Both;20 reps;Supine  Quad Sets AROM;Both;20 reps  Short Arc Quad AAROM;Strengthening;Left;15 reps  Heel Slides AAROM;15 reps;Left;Other (comment) (with sheet)  Hip ABduction/ADduction AAROM;Strengthening;Left;15 reps  Goniometric ROM approx -5-40* with exercises L knee supine  PT - End of Session  Activity Tolerance Patient tolerated treatment well  Patient left with call bell/phone within reach;in bed  PT - Assessment/Plan  Comments on Treatment Session Pt reports just getting back to bed but agreeable to perform exercises.  PT Plan Discharge plan remains appropriate;Frequency remains appropriate  Follow Up Recommendations Home health PT  Equipment Recommended None recommended by PT  Acute Rehab PT Goals  PT Goal: Perform Home Exercise Program - Progress Progressing toward goal    Zenovia Jarred, PT Pager: [phone]

## 2011-12-10 NOTE — Progress Notes (Signed)
Utilization review completed.  

## 2011-12-11 DIAGNOSIS — E876 Hypokalemia: Secondary | ICD-10-CM | POA: Diagnosis not present

## 2011-12-11 LAB — CBC
Platelets: 265 10*3/uL (ref 150–400)
RBC: 3.43 MIL/uL — ABNORMAL LOW (ref 3.87–5.11)
RDW: 12.2 % (ref 11.5–15.5)
WBC: 9.7 10*3/uL (ref 4.0–10.5)

## 2011-12-11 LAB — BASIC METABOLIC PANEL
Chloride: 101 mEq/L (ref 96–112)
Creatinine, Ser: 0.57 mg/dL (ref 0.50–1.10)
GFR calc Af Amer: >90 mL/min (ref >90)
Potassium: 3.1 mEq/L — ABNORMAL LOW (ref 3.5–5.1)
Sodium: 137 mEq/L (ref 135–145)

## 2011-12-11 MED ORDER — METHOCARBAMOL 500 MG PO TABS: 500.0000 mg | ORAL_TABLET | Freq: Four times a day (QID) | ORAL | Status: AC | PRN

## 2011-12-11 MED ORDER — OXYCODONE HCL 5 MG PO TABS: 5.0000 mg | ORAL_TABLET | ORAL | Status: AC | PRN

## 2011-12-11 MED ORDER — POTASSIUM CHLORIDE CRYS ER 20 MEQ PO TBCR
40.0000 meq | EXTENDED_RELEASE_TABLET | Freq: Two times a day (BID) | ORAL | Status: AC
  Administered 2011-12-11 (×2): 40 meq via ORAL
  Filled 2011-12-11 (×2): qty 2

## 2011-12-11 MED ORDER — RIVAROXABAN 10 MG PO TABS: 10.0000 mg | ORAL_TABLET | Freq: Every day | ORAL | Status: DC

## 2011-12-11 NOTE — Progress Notes (Signed)
   Subjective: 2 Days Post-Op Procedure(s) (LRB): TOTAL KNEE ARTHROPLASTY (Left) Patient reports pain as mild.   Patient seen in rounds with Dr. Lequita Halt. Patient is well, and has had no acute complaints or problems Plan is to go Home after hospital stay.  Objective: Vital signs in last 24 hours: Temp:  [98.5 F (36.9 C)-99 F (37.2 C)] 99 F (37.2 C) (08/02 0540) Pulse Rate:  [80-114] 114  (08/02 0540) Resp:  [15-18] 16  (08/02 0800) BP: (117-134)/(60-79) 131/79 mmHg (08/02 0540) SpO2:  [94 %-98 %] 95 % (08/02 0800)  Intake/Output from previous day:  Intake/Output Summary (Last 24 hours) at 12/11/11 0930 Last data filed at 12/11/11 0600  Gross per 24 hour  Intake 1923.25 ml  Output   2200 ml  Net -276.75 ml    Intake/Output this shift:    Labs:  Basename 12/11/11 0400 12/10/11 0447  HGB 10.5* 10.9*    Basename 12/11/11 0400 12/10/11 0447  WBC 9.7 10.0  RBC 3.43* 3.54*  HCT 31.3* 32.3*  PLT 265 298    Basename 12/11/11 0400 12/10/11 0447  NA 137 138  K 3.1* 3.6  CL 101 105  CO2 29 27  BUN 6 10  CREATININE 0.57 0.64  GLUCOSE 126* 164*  CALCIUM 8.9 8.6   No results found for this basename: LABPT:2,INR:2 in the last 72 hours  EXAM General - Patient is Alert, Appropriate and Oriented Extremity - Neurovascular intact Sensation intact distally Dorsiflexion/Plantar flexion intact No cellulitis present Dressing/Incision - clean, dry, no drainage, healing Motor Function - intact, moving foot and toes well on exam.   Past Medical History  Diagnosis Date  . PONV (postoperative nausea and vomiting)   . Hypertension   . Depression   . Arthritis   . Diverticulosis     Assessment/Plan: 2 Days Post-Op Procedure(s) (LRB): TOTAL KNEE ARTHROPLASTY (Left) Principal Problem:  *OA (osteoarthritis) of knee Active Problems:  Postop Hypokalemia   Up with therapy Plan for discharge tomorrow Discharge home with home health  DVT Prophylaxis -  Xarelto Weight-Bearing as tolerated to left leg DC Foley  PERKINS, ALEXZANDREW 12/11/2011, 9:30 AM

## 2011-12-11 NOTE — Discharge Summary (Signed)
Physician Discharge Summary   Patient ID: Christina Zhang MRN: [id number] DOB/AGE: [birth year] 66 y.o.  Admit date: 12/09/2011 Discharge date: 12/12/2011  Primary Diagnosis: Osteoarthritis Left knee   Admission Diagnoses:  Past Medical History  Diagnosis Date  . PONV (postoperative nausea and vomiting)   . Hypertension   . Depression   . Arthritis   . Diverticulosis    Discharge Diagnoses:   Principal Problem:  *OA (osteoarthritis) of knee Active Problems:  Postop Hypokalemia  Procedure:  Procedure(s) (LRB): TOTAL KNEE ARTHROPLASTY (Left)   Consults: None  HPI: Christina Zhang is a 66 y.o. year old female with end stage OA of her left knee with progressively worsening pain and dysfunction. She has constant pain, with activity and at rest and significant functional deficits with difficulties even with ADLs. She has had extensive non-op management including analgesics, injections of cortisone and viscosupplements, and home exercise program, but remains in significant pain with significant dysfunction. Radiographs show bone on bone arthritis medial and patellofemoral. She presents now for left Total Knee Arthroplasty.       Laboratory Data: Hospital Outpatient Visit on 12/03/2011  Component Date Value Range Status  . aPTT 12/03/2011 32  24 - 37 seconds Final  . WBC 12/03/2011 7.8  4.0 - 10.5 K/uL Final  . RBC 12/03/2011 4.43  3.87 - 5.11 MIL/uL Final  . Hemoglobin 12/03/2011 13.6  12.0 - 15.0 g/dL Final  . HCT 46/96/2952 39.9  36.0 - 46.0 % Final  . MCV 12/03/2011 90.1  78.0 - 100.0 fL Final  . MCH 12/03/2011 30.7  26.0 - 34.0 pg Final  . MCHC 12/03/2011 34.1  30.0 - 36.0 g/dL Final  . RDW 84/13/2440 12.1  11.5 - 15.5 % Final  . Platelets 12/03/2011 391  150 - 400 K/uL Final  . Sodium 12/03/2011 138  135 - 145 mEq/L Final  . Potassium 12/03/2011 3.6  3.5 - 5.1 mEq/L Final  . Chloride 12/03/2011 99  96 - 112 mEq/L Final  . CO2 12/03/2011 27  19 - 32 mEq/L Final  .  Glucose, Bld 12/03/2011 96  70 - 99 mg/dL Final  . BUN 03/07/2535 19  6 - 23 mg/dL Final  . Creatinine, Ser 12/03/2011 0.63  0.50 - 1.10 mg/dL Final  . Calcium 64/40/3474 10.3  8.4 - 10.5 mg/dL Final  . Total Protein 12/03/2011 7.9  6.0 - 8.3 g/dL Final  . Albumin 25/95/6387 4.2  3.5 - 5.2 g/dL Final  . AST 56/43/3295 22  0 - 37 U/L Final  . ALT 12/03/2011 13  0 - 35 U/L Final  . Alkaline Phosphatase 12/03/2011 108  39 - 117 U/L Final  . Total Bilirubin 12/03/2011 0.3  0.3 - 1.2 mg/dL Final  . GFR calc non Af Amer 12/03/2011 >90  >90 mL/min Final  . GFR calc Af Amer 12/03/2011 >90  >90 mL/min Final   Comment:                                 The eGFR has been calculated                          using the CKD EPI equation.                          This calculation has not been  validated in all clinical                          situations.                          eGFR's persistently                          <90 mL/min signify                          possible Chronic Kidney Disease.  . Color, Urine 12/03/2011 YELLOW  YELLOW Final  . APPearance 12/03/2011 CLEAR  CLEAR Final  . Specific Gravity, Urine 12/03/2011 1.028  1.005 - 1.030 Final  . pH 12/03/2011 5.5  5.0 - 8.0 Final  . Glucose, UA 12/03/2011 NEGATIVE  NEGATIVE mg/dL Final  . Hgb urine dipstick 12/03/2011 NEGATIVE  NEGATIVE Final  . Bilirubin Urine 12/03/2011 NEGATIVE  NEGATIVE Final  . Ketones, ur 12/03/2011 NEGATIVE  NEGATIVE mg/dL Final  . Protein, ur 30/86/5784 NEGATIVE  NEGATIVE mg/dL Final  . Urobilinogen, UA 12/03/2011 1.0  0.0 - 1.0 mg/dL Final  . Nitrite 69/62/9528 NEGATIVE  NEGATIVE Final  . Leukocytes, UA 12/03/2011 SMALL* NEGATIVE Final  . MRSA, PCR 12/03/2011 NEGATIVE  NEGATIVE Final  . Staphylococcus aureus 12/03/2011 NEGATIVE  NEGATIVE Final   Comment:                                 The Xpert SA Assay (FDA                          approved for NASAL specimens                           only), is one component of                          a comprehensive surveillance                          program.  It is not intended                          to diagnose infection nor to                          guide or monitor treatment.  . Squamous Epithelial / LPF 12/03/2011 RARE  RARE Final  . WBC, UA 12/03/2011 3-6  <3 WBC/hpf Final  . RBC / HPF 12/03/2011 0-2  <3 RBC/hpf Final  . Bacteria, UA 12/03/2011 RARE  RARE Final  . Prothrombin Time 12/03/2011 12.6  11.6 - 15.2 seconds Final  . INR 12/03/2011 0.92  0.00 - 1.49 Final    Basename 12/11/11 0400 12/10/11 0447  HGB 10.5* 10.9*    Basename 12/11/11 0400 12/10/11 0447  WBC 9.7 10.0  RBC 3.43* 3.54*  HCT 31.3* 32.3*  PLT 265 298    Basename 12/11/11 0400 12/10/11 0447  NA 137 138  K 3.1* 3.6  CL 101 105  CO2 29 27  BUN 6 10  CREATININE 0.57 0.64  GLUCOSE 126*  164*  CALCIUM 8.9 8.6   No results found for this basename: LABPT:2,INR:2 in the last 72 hours  X-Rays:Dg Chest 2 View  12/03/2011  *RADIOLOGY REPORT*  Clinical Data: Preop left knee.  Hypertension.  CHEST - 2 VIEW  Comparison: None.  Findings: The heart size and mediastinal contours are within normal limits.  Both lungs are clear.  The visualized skeletal structures are unremarkable.  IMPRESSION: No active cardiopulmonary abnormalities.  Original Report Authenticated By: Rosealee Albee, M.D.    EKG: Orders placed during the hospital encounter of 12/03/11  . EKG 12-LEAD  . EKG 12-LEAD     Hospital Course: Patient was admitted to [facility] and taken to the OR and underwent the above state procedure without complications.  Patient tolerated the procedure well and was later transferred to the recovery room and then to the orthopaedic floor for postoperative care.  They were given PO and IV analgesics for pain control following their surgery.  They were given 24 hours of postoperative antibiotics and started on DVT prophylaxis in the form of  Xarelto.   PT and OT were ordered for total joint protocol.  Discharge planning consulted to help with postop disposition and equipment needs.  Patient had a decent night on the evening of surgery and started to get up OOB with therapy on day one. Hemovac drain was pulled without difficulty.  Continued to work with therapy into day two.  Dressing was changed on day two and the incision was healing well.  By day three, the patient had progressed with therapy and meeting their goals.  Incision was healing well.  Patient was seen in rounds and was ready to go home.  Discharge Medications: Prior to Admission medications   Medication Sig Start Date End Date Taking? Authorizing Provider  hydrochlorothiazide (HYDRODIURIL) 25 MG tablet Take 12.5 mg by mouth every morning.   Yes Historical Provider, MD  sertraline (ZOLOFT) 50 MG tablet Take 50 mg by mouth every morning.   Yes Historical Provider, MD  methocarbamol (ROBAXIN) 500 MG tablet Take 1 tablet (500 mg total) by mouth every 6 (six) hours as needed. 12/11/11 12/21/11  Alexzandrew Perkins, PA  oxyCODONE (OXY IR/ROXICODONE) 5 MG immediate release tablet Take 1-2 tablets (5-10 mg total) by mouth every 4 (four) hours as needed for pain. 12/11/11 12/21/11  Alexzandrew Julien Girt, PA  rivaroxaban (XARELTO) 10 MG TABS tablet Take 1 tablet (10 mg total) by mouth daily with breakfast. Take Xarelto for two and a half more weeks, then discontinue Xarelto. Once the patient has completed the Xarelto, they may resume the 81 mg Aspirin. 12/11/11   Alexzandrew Julien Girt, PA    Diet: Cardiac diet Activity:WBAT Follow-up:in 2 weeks Disposition - Home Discharged Condition: good   Discharge Orders    Future Orders Please Complete By Expires   Diet - low sodium heart healthy      Call MD / Call 911      Comments:   If you experience chest pain or shortness of breath, CALL 911 and be transported to the hospital emergency room.  If you develope a fever above 101 F, pus (white  drainage) or increased drainage or redness at the wound, or calf pain, call your surgeon's office.   Discharge instructions      Comments:   Pick up stool softner and laxative for home. Do not submerge incision under water. May shower. Continue to use ice for pain and swelling from surgery.  Take Xarelto for two and  a half more weeks, then discontinue Xarelto. Once the patient has completed the Xarelto, they may resume the 81 mg Aspirin.   Constipation Prevention      Comments:   Drink plenty of fluids.  Prune juice may be helpful.  You may use a stool softener, such as Colace (over the counter) 100 mg twice a day.  Use MiraLax (over the counter) for constipation as needed.   Increase activity slowly as tolerated      Patient may shower      Comments:   You may shower without a dressing once there is no drainage.  Do not wash over the wound.  If drainage remains, do not shower until drainage stops.   Driving restrictions      Comments:   No driving until released by the physician.   Lifting restrictions      Comments:   No lifting until released by the physician.   TED hose      Comments:   Use stockings (TED hose) for 3 weeks on both leg(s).  You may remove them at night for sleeping.   Change dressing      Comments:   Change dressing daily with sterile 4 x 4 inch gauze dressing and apply TED hose. Do not submerge the incision under water.   Do not put a pillow under the knee. Place it under the heel.      Do not sit on low chairs, stoools or toilet seats, as it may be difficult to get up from low surfaces        Medication List  As of 12/11/2011 12:14 PM   STOP taking these medications         aspirin EC 81 MG tablet      CALCIUM 600 PO      cholecalciferol 1000 UNITS tablet      fish oil-omega-3 fatty acids 1000 MG capsule         TAKE these medications         hydrochlorothiazide 25 MG tablet   Commonly known as: HYDRODIURIL   Take 12.5 mg by mouth every morning.        methocarbamol 500 MG tablet   Commonly known as: ROBAXIN   Take 1 tablet (500 mg total) by mouth every 6 (six) hours as needed.      oxyCODONE 5 MG immediate release tablet   Commonly known as: Oxy IR/ROXICODONE   Take 1-2 tablets (5-10 mg total) by mouth every 4 (four) hours as needed for pain.      rivaroxaban 10 MG Tabs tablet   Commonly known as: XARELTO   Take 1 tablet (10 mg total) by mouth daily with breakfast. Take Xarelto for two and a half more weeks, then discontinue Xarelto.  Once the patient has completed the Xarelto, they may resume the 81 mg Aspirin.      sertraline 50 MG tablet   Commonly known as: ZOLOFT   Take 50 mg by mouth every morning.           Follow-up Information    Follow up with Loanne Drilling, MD. Schedule an appointment as soon as possible for a visit in 2 weeks.   Contact information:   [facility] [address] [place] [address] [phone]          Signed: Patrica Duel 12/11/2011, 12:14 PM

## 2011-12-11 NOTE — Evaluation (Signed)
Occupational Therapy Evaluation Patient Details Name: Christina Zhang MRN: [id number] DOB: Oct 12, 1945 Today's Date: 12/11/2011 Time: 3664-4034 OT Time Calculation (min): 21 min  OT Assessment / Plan / Recommendation Clinical Impression  This 66 year old female was admitted for L TKA.  She will benefit from OT in acute to complete education for toilet and shower transfers as well as increasing independence with adls with min to min guard level goals.      OT Assessment  Patient needs continued OT Services    Follow Up Recommendations  No OT follow up    Barriers to Discharge      Equipment Recommendations  3 in 1 bedside comode;None recommended by PT    Recommendations for Other Services    Frequency  Min 2X/week    Precautions / Restrictions Precautions Required Braces or Orthoses: Knee Immobilizer - Left Knee Immobilizer - Left: Discontinue once straight leg raise with < 10 degree lag Restrictions Weight Bearing Restrictions: Yes LLE Weight Bearing: Weight bearing as tolerated   Pertinent Vitals/Pain 4 L knee; repositioned    ADL  Grooming: Simulated;Set up Where Assessed - Grooming: Unsupported sitting Upper Body Bathing: Simulated;Set up Where Assessed - Upper Body Bathing: Unsupported sitting Lower Body Bathing: Simulated;Minimal assistance (mod for sit to stand) Where Assessed - Lower Body Bathing: Supported sit to stand Upper Body Dressing: Simulated;Set up Where Assessed - Upper Body Dressing: Unsupported sitting Lower Body Dressing: Simulated;Minimal assistance (mod for sit to stand) Where Assessed - Lower Body Dressing: Supported sit to stand Toilet Transfer: Simulated;Moderate assistance (mod A sit to stand; min A SPT) Toilet Transfer Method: Stand pivot Toileting - Clothing Manipulation and Hygiene: Simulated;Minimal assistance (once standing) ADL Comments: husband can assist with LB adls. Pt can reach to ankles bilaterally    OT Diagnosis: Generalized  weakness  OT Problem List: Decreased strength;Decreased activity tolerance;Decreased knowledge of use of DME or AE;Pain OT Treatment Interventions: Self-care/ADL training;DME and/or AE instruction;Patient/family education   OT Goals Acute Rehab OT Goals OT Goal Formulation: With patient Time For Goal Achievement: 12/18/11 Potential to Achieve Goals: Good ADL Goals Pt Will Perform Lower Body Bathing: with min assist;Sit to stand from chair (including sit to stand) ADL Goal: Lower Body Bathing - Progress: Goal set today Pt Will Transfer to Toilet: with min assist;Ambulation;3-in-1 (min guard, min A for sit to stand) ADL Goal: Toilet Transfer - Progress: Goal set today Pt Will Perform Toileting - Clothing Manipulation: with supervision (min a sit to stand for hygiene/clothes) ADL Goal: Toileting - Clothing Manipulation - Progress: Goal set today Pt Will Perform Tub/Shower Transfer: Shower transfer;with min assist;Ambulation (3:1) ADL Goal: Tub/Shower Transfer - Progress: Goal set today  Visit Information  Last OT Received On: 12/11/11 Assistance Needed: +1 (spt)    Subjective Data  Subjective: "My husband can't bend his knee but he can do everything else (move 3:1 into shower) Patient Stated Goal: none stated   Prior Functioning  Vision/Perception  Home Living Lives With: Spouse Bathroom Shower/Tub: Health visitor: Standard Prior Function Level of Independence: Independent Communication Communication: No difficulties      Cognition  Overall Cognitive Status: Appears within functional limits for tasks assessed/performed Behavior During Session: [facility] for tasks performed    Extremity/Trunk Assessment Right Upper Extremity Assessment RUE ROM/Strength/Tone: Within functional levels Left Upper Extremity Assessment LUE ROM/Strength/Tone: Deficits (cannot fully extend elbow since childhood)   Mobility Bed Mobility Supine to Sit: 4: Min assist;HOB elevated     Exercise  Balance    End of Session OT - End of Session Activity Tolerance: Patient tolerated treatment well Patient left: in chair;with call bell/phone within reach CPM Left Knee CPM Left Knee: Off  GO     Zella Dewan 12/11/2011, 11:46 AM Marica Otter, OTR/L [phone] 12/11/2011

## 2011-12-11 NOTE — Progress Notes (Signed)
Physical Therapy Treatment Patient Details Name: Christina Zhang MRN: [id number] DOB: November 11, 1945 Today's Date: 12/11/2011 Time: 4098-1191 PT Time Calculation (min): 22 min  PT Assessment / Plan / Recommendation Comments on Treatment Session  Pt did well with stair training, however became slightly nauseated following the stairs, therefore deferred exercises.  Better once in bed.  Pt states her HR was elevated to 140's earlier, however she feels better now and states that it is due to her husband having cancer.      Follow Up Recommendations  Home health PT    Barriers to Discharge        Equipment Recommendations  3 in 1 bedside comode;None recommended by PT    Recommendations for Other Services    Frequency 7X/week   Plan Discharge plan remains appropriate;Frequency remains appropriate    Precautions / Restrictions Precautions Precautions: Knee Required Braces or Orthoses: Knee Immobilizer - Left Knee Immobilizer - Left: Discontinue once straight leg raise with < 10 degree lag Restrictions Weight Bearing Restrictions: Yes LLE Weight Bearing: Weight bearing as tolerated   Pertinent Vitals/Pain 4/10    Mobility  Bed Mobility Bed Mobility: Sit to Supine Sit to Supine: 4: Min assist Details for Bed Mobility Assistance: Assist for LLE into bed.  Pt demos good UE technique.  Transfers Transfers: Stand to Sit;Sit to Stand Sit to Stand: 4: Min assist;With upper extremity assist;With armrests;From chair/3-in-1 Stand to Sit: 4: Min assist;With upper extremity assist;To bed Details for Transfer Assistance: Performed transfers x 3 in order to get up from 3 in 1 and rest break due to nausea after stair training.  cues for hand placement and LE management.  Ambulation/Gait Ambulation/Gait Assistance: 4: Min assist Ambulation Distance (Feet): 30 Feet Assistive device: Rolling walker Ambulation/Gait Assistance Details: Min cues for upright posture, sequencing/technique.  Gait Pattern:  Step-to pattern Gait velocity: decreased Stairs: Yes Stairs Assistance: 4: Min assist Stairs Assistance Details (indicate cue type and reason): Cues for sequencing/technique with RW Stair Management Technique: No rails;Backwards;Step to pattern;With walker;Forwards Number of Stairs: 3     Exercises     PT Diagnosis:    PT Problem List:   PT Treatment Interventions:     PT Goals Acute Rehab PT Goals PT Goal Formulation: With patient Time For Goal Achievement: 12/17/11 Potential to Achieve Goals: Good Pt will go Sit to Supine/Side: with modified independence PT Goal: Sit to Supine/Side - Progress: Progressing toward goal Pt will go Sit to Stand: with modified independence PT Goal: Sit to Stand - Progress: Progressing toward goal Pt will go Stand to Sit: with modified independence PT Goal: Stand to Sit - Progress: Progressing toward goal Pt will Ambulate: 51 - 150 feet;with modified independence;with least restrictive assistive device PT Goal: Ambulate - Progress: Progressing toward goal Pt will Go Up / Down Stairs: 6-9 stairs;with supervision;with rail(s) PT Goal: Up/Down Stairs - Progress: Progressing toward goal  Visit Information  Last PT Received On: 12/11/11 Assistance Needed: +1    Subjective Data  Subjective: My heart was racing earlier, but its because of my husband.  He has cancer.  Patient Stated Goal: to get home.   Cognition  Overall Cognitive Status: Appears within functional limits for tasks assessed/performed Arousal/Alertness: Awake/alert Orientation Level: Appears intact for tasks assessed Behavior During Session: [facility] for tasks performed    Balance     End of Session PT - End of Session Equipment Utilized During Treatment: Left knee immobilizer Activity Tolerance: Other (comment) (Limited by nausea) Patient left: in  bed;with call bell/phone within reach;with family/visitor present Nurse Communication: Mobility status   GP     Page, Meribeth Mattes 12/11/2011, 4:31 PM

## 2011-12-11 NOTE — Progress Notes (Signed)
Physical Therapy Treatment Patient Details Name: Christina Zhang MRN: [id number] DOB: 05-Oct-1945 Today's Date: 12/11/2011 Time: 4098-1191 PT Time Calculation (min): 18 min  PT Assessment / Plan / Recommendation Comments on Treatment Session  Pt feeling less nauseated today and able to ambulate in hallway at min assist.  May initiate stair training in pm if feeling well.     Follow Up Recommendations  Home health PT    Barriers to Discharge        Equipment Recommendations  3 in 1 bedside comode;None recommended by PT    Recommendations for Other Services    Frequency 7X/week   Plan Discharge plan remains appropriate;Frequency remains appropriate    Precautions / Restrictions Precautions Precautions: Knee Required Braces or Orthoses: Knee Immobilizer - Left Knee Immobilizer - Left: Discontinue once straight leg raise with < 10 degree lag Restrictions Weight Bearing Restrictions: Yes LLE Weight Bearing: Weight bearing as tolerated   Pertinent Vitals/Pain 4/10    Mobility  Bed Mobility Bed Mobility: Sit to Supine Supine to Sit: 4: Min assist;HOB elevated Sit to Supine: 4: Min assist Details for Bed Mobility Assistance: Min assist for L LE into bed with cues for positioning hips once in bed.  Transfers Transfers: Stand to Sit;Sit to Stand Sit to Stand: 4: Min assist;With upper extremity assist;With armrests;From chair/3-in-1 Stand to Sit: 4: Min assist;With upper extremity assist;To bed Details for Transfer Assistance: Assist to rise and steady with cues for hand placement and LE management when sitting/standing.  Ambulation/Gait Ambulation/Gait Assistance: 4: Min assist Ambulation Distance (Feet): 35 Feet Assistive device: Rolling walker Ambulation/Gait Assistance Details: Cues for sequencing/technique with RW, for equal step length and for upright posture.   Gait Pattern: Step-to pattern Gait velocity: decreased    Exercises     PT Diagnosis:    PT Problem List:     PT Treatment Interventions:     PT Goals Acute Rehab PT Goals PT Goal Formulation: With patient Time For Goal Achievement: 12/17/11 Potential to Achieve Goals: Good Pt will go Sit to Supine/Side: with modified independence PT Goal: Sit to Supine/Side - Progress: Progressing toward goal Pt will go Sit to Stand: with modified independence PT Goal: Sit to Stand - Progress: Progressing toward goal Pt will go Stand to Sit: with modified independence PT Goal: Stand to Sit - Progress: Progressing toward goal Pt will Ambulate: 51 - 150 feet;with modified independence;with least restrictive assistive device PT Goal: Ambulate - Progress: Progressing toward goal  Visit Information  Last PT Received On: 12/11/11 Assistance Needed: +1    Subjective Data  Subjective: I feel a lot better today than I did yesterday.    Cognition  Overall Cognitive Status: Appears within functional limits for tasks assessed/performed Arousal/Alertness: Awake/alert Orientation Level: Appears intact for tasks assessed Behavior During Session: [facility] for tasks performed    Balance     End of Session PT - End of Session Equipment Utilized During Treatment: Left knee immobilizer Activity Tolerance: Patient tolerated treatment well Patient left: in bed;with call bell/phone within reach Nurse Communication: Mobility status   GP     Page, Meribeth Mattes 12/11/2011, 12:38 PM

## 2011-12-12 LAB — CBC
HCT: 29.5 % — ABNORMAL LOW (ref 36.0–46.0)
Hemoglobin: 10 g/dL — ABNORMAL LOW (ref 12.0–15.0)
RBC: 3.24 MIL/uL — ABNORMAL LOW (ref 3.87–5.11)
RDW: 12.3 % (ref 11.5–15.5)
WBC: 9.7 10*3/uL (ref 4.0–10.5)

## 2011-12-12 LAB — BASIC METABOLIC PANEL
BUN: 8 mg/dL (ref 6–23)
CO2: 27 mEq/L (ref 19–32)
Chloride: 102 mEq/L (ref 96–112)
GFR calc Af Amer: >90 mL/min (ref >90)
Potassium: 3.6 mEq/L (ref 3.5–5.1)

## 2011-12-12 NOTE — Progress Notes (Signed)
Subjective: 3 Days Post-Op Procedure(s) (LRB): TOTAL KNEE ARTHROPLASTY (Left) Patient reports pain as mild.    Objective: Vital signs in last 24 hours: Temp:  [98.4 F (36.9 C)-99.6 F (37.6 C)] 99.6 F (37.6 C) (08/03 0539) Pulse Rate:  [99-132] 113  (08/03 0539) Resp:  [16-20] 18  (08/03 0800) BP: (121-137)/(77-86) 133/80 mmHg (08/02 2128) SpO2:  [92 %-97 %] 95 % (08/03 0800)  Intake/Output from previous day: 08/02 0701 - 08/03 0700 In: 450 [P.O.:120; I.V.:330] Out: 1050 [Urine:1050] Intake/Output this shift: Total I/O In: 360 [P.O.:360] Out: -    Basename 12/12/11 0530 12/11/11 0400 12/10/11 0447  HGB 10.0* 10.5* 10.9*    Basename 12/12/11 0530 12/11/11 0400  WBC 9.7 9.7  RBC 3.24* 3.43*  HCT 29.5* 31.3*  PLT 271 265    Basename 12/12/11 0530 12/11/11 0400  NA 137 137  K 3.6 3.1*  CL 102 101  CO2 27 29  BUN 8 6  CREATININE 0.52 0.57  GLUCOSE 121* 126*  CALCIUM 8.8 8.9   No results found for this basename: LABPT:2,INR:2 in the last 72 hours  wound dressed ad dry,.  Assessment/Plan: 3 Days Post-Op Procedure(s) (LRB): TOTAL KNEE ARTHROPLASTY (Left) Discharge home with home health  Toni Arthurs 12/12/2011, 8:49 AM

## 2011-12-12 NOTE — Progress Notes (Signed)
Pt stable, scripts, d/c instructions given with no questions/concerns voiced.  Pt transported via wheelchair to private vehicle with NT and husband.

## 2011-12-12 NOTE — Progress Notes (Signed)
Physical Therapy Treatment Patient Details Name: Christina Zhang MRN: [id number] DOB: Dec 21, 1945 Today's Date: 12/12/2011 Time: 6213-0865 PT Time Calculation (min): 38 min  PT Assessment / Plan / Recommendation Comments on Treatment Session  Pts doing well with mobility, stair training and exercises, however is limited by nausea when up and moving.  RN notified and also notified of elevated HR once out of restroom (135, down to 112 with rest).     Follow Up Recommendations  Home health PT    Barriers to Discharge        Equipment Recommendations  3 in 1 bedside comode;None recommended by PT    Recommendations for Other Services    Frequency 7X/week   Plan Discharge plan remains appropriate;Frequency remains appropriate    Precautions / Restrictions Precautions Precautions: Knee Required Braces or Orthoses: Knee Immobilizer - Left Knee Immobilizer - Left: Discontinue once straight leg raise with < 10 degree lag Restrictions Weight Bearing Restrictions: Yes LLE Weight Bearing: Weight bearing as tolerated   Pertinent Vitals/Pain 3/10    Mobility  Bed Mobility Bed Mobility: Supine to Sit Supine to Sit: 5: Supervision;HOB flat Details for Bed Mobility Assistance: Pt able to get to EOB at supervision for safety with HOB flat to better simulate home environment.  Transfers Transfers: Sit to Stand;Stand to Sit Sit to Stand: 5: Supervision;From elevated surface;With upper extremity assist;From bed;From chair/3-in-1 Stand to Sit: 5: Supervision;With upper extremity assist;With armrests;To chair/3-in-1 Details for Transfer Assistance: Performed transfers x 2 in order to use 3in1 in restroom with min cues for hand placement.  Ambulation/Gait Ambulation/Gait Assistance: 4: Min guard Ambulation Distance (Feet): 50 Feet Assistive device: Rolling walker Ambulation/Gait Assistance Details: Min cues for relaxed posture.  Gait Pattern: Step-to pattern Gait velocity: decreased Stairs:  Yes Stairs Assistance: 4: Min guard Stairs Assistance Details (indicate cue type and reason): Min cues for sequencing/technique with two handrails to simulate inside stairs at home.  Stair Management Technique: Two rails;Step to pattern;Forwards Number of Stairs: 4     Exercises Total Joint Exercises Ankle Circles/Pumps: AROM;Both;20 reps Quad Sets: AROM;Strengthening;Left;10 reps Heel Slides: AAROM;Left;10 reps Hip ABduction/ADduction: AAROM;Left;10 reps Straight Leg Raises: AAROM;Left;10 reps   PT Diagnosis:    PT Problem List:   PT Treatment Interventions:     PT Goals Acute Rehab PT Goals PT Goal Formulation: With patient Time For Goal Achievement: 12/17/11 Potential to Achieve Goals: Good Pt will go Supine/Side to Sit: with modified independence PT Goal: Supine/Side to Sit - Progress: Progressing toward goal Pt will go Sit to Stand: with modified independence PT Goal: Sit to Stand - Progress: Progressing toward goal Pt will go Stand to Sit: with modified independence PT Goal: Stand to Sit - Progress: Progressing toward goal Pt will Ambulate: 51 - 150 feet;with modified independence;with least restrictive assistive device PT Goal: Ambulate - Progress: Progressing toward goal Pt will Go Up / Down Stairs: 6-9 stairs;with supervision;with rail(s) PT Goal: Up/Down Stairs - Progress: Progressing toward goal Pt will Perform Home Exercise Program: with supervision, verbal cues required/provided PT Goal: Perform Home Exercise Program - Progress: Met  Visit Information  Last PT Received On: 12/12/11 Assistance Needed: +1    Subjective Data  Subjective: Im feeling a little nauseated (while up) Patient Stated Goal: to get home.   Cognition  Overall Cognitive Status: Appears within functional limits for tasks assessed/performed Arousal/Alertness: Awake/alert Orientation Level: Appears intact for tasks assessed Behavior During Session: [facility] for tasks performed    Balance  End of Session PT - End of Session Equipment Utilized During Treatment: Left knee immobilizer Activity Tolerance: Other (comment) (Continues to be limited by elevated HR and nausea) Patient left: in chair;with call bell/phone within reach Nurse Communication: Mobility status (HR and nausea) CPM Left Knee CPM Left Knee: Off   GP     Page, Meribeth Mattes 12/12/2011, 9:09 AM

## 2011-12-12 NOTE — Progress Notes (Signed)
OT Note:  Pt ready for d/c and dressed.  States steps for shower transfer:  Did not feel she needed to practice at this time.  [place], [place] [phone] 12/12/2011

## 2011-12-17 ENCOUNTER — Encounter (HOSPITAL_COMMUNITY): Payer: Self-pay | Admitting: Orthopedic Surgery

## 2012-01-15 ENCOUNTER — Other Ambulatory Visit: Payer: Self-pay | Admitting: Family Medicine

## 2012-01-15 DIAGNOSIS — Z1231 Encounter for screening mammogram for malignant neoplasm of breast: Secondary | ICD-10-CM

## 2012-02-03 ENCOUNTER — Ambulatory Visit
Admission: RE | Admit: 2012-02-03 | Discharge: 2012-02-03 | Disposition: A | Discharge status: Home / Self Care | Payer: 59 | Source: Ambulatory Visit | Attending: Family Medicine | Admitting: Family Medicine

## 2012-02-03 DIAGNOSIS — Z1231 Encounter for screening mammogram for malignant neoplasm of breast: Secondary | ICD-10-CM

## 2013-02-14 ENCOUNTER — Other Ambulatory Visit: Payer: Self-pay

## 2013-02-14 DIAGNOSIS — Z1231 Encounter for screening mammogram for malignant neoplasm of breast: Secondary | ICD-10-CM

## 2013-03-15 ENCOUNTER — Ambulatory Visit: Payer: 59

## 2013-04-11 ENCOUNTER — Ambulatory Visit
Admission: RE | Admit: 2013-04-11 | Discharge: 2013-04-11 | Disposition: A | Discharge status: Home / Self Care | Payer: 59 | Source: Ambulatory Visit

## 2013-04-11 DIAGNOSIS — Z1231 Encounter for screening mammogram for malignant neoplasm of breast: Secondary | ICD-10-CM | POA: Diagnosis not present

## 2013-05-10 ENCOUNTER — Encounter: Payer: Self-pay | Admitting: Obstetrics and Gynecology

## 2013-11-24 DIAGNOSIS — I10 Essential (primary) hypertension: Secondary | ICD-10-CM | POA: Diagnosis not present

## 2013-11-24 DIAGNOSIS — E785 Hyperlipidemia, unspecified: Secondary | ICD-10-CM | POA: Diagnosis not present

## 2013-11-29 DIAGNOSIS — F329 Major depressive disorder, single episode, unspecified: Secondary | ICD-10-CM | POA: Diagnosis not present

## 2013-11-29 DIAGNOSIS — M1990 Unspecified osteoarthritis, unspecified site: Secondary | ICD-10-CM | POA: Diagnosis not present

## 2013-11-29 DIAGNOSIS — F3289 Other specified depressive episodes: Secondary | ICD-10-CM | POA: Diagnosis not present

## 2013-11-29 DIAGNOSIS — I10 Essential (primary) hypertension: Secondary | ICD-10-CM | POA: Diagnosis not present

## 2013-11-29 DIAGNOSIS — E785 Hyperlipidemia, unspecified: Secondary | ICD-10-CM | POA: Diagnosis not present

## 2014-03-12 ENCOUNTER — Other Ambulatory Visit: Payer: Self-pay

## 2014-03-12 DIAGNOSIS — Z1231 Encounter for screening mammogram for malignant neoplasm of breast: Secondary | ICD-10-CM

## 2014-04-13 ENCOUNTER — Ambulatory Visit
Admission: RE | Admit: 2014-04-13 | Discharge: 2014-04-13 | Disposition: A | Discharge status: Home / Self Care | Payer: Medicare Other | Source: Ambulatory Visit

## 2014-04-13 DIAGNOSIS — Z1231 Encounter for screening mammogram for malignant neoplasm of breast: Secondary | ICD-10-CM

## 2014-05-02 DIAGNOSIS — H2513 Age-related nuclear cataract, bilateral: Secondary | ICD-10-CM | POA: Diagnosis not present

## 2014-05-02 DIAGNOSIS — H40033 Anatomical narrow angle, bilateral: Secondary | ICD-10-CM | POA: Diagnosis not present

## 2014-07-30 DIAGNOSIS — E785 Hyperlipidemia, unspecified: Secondary | ICD-10-CM | POA: Diagnosis not present

## 2014-07-30 DIAGNOSIS — F331 Major depressive disorder, recurrent, moderate: Secondary | ICD-10-CM | POA: Diagnosis not present

## 2014-07-30 DIAGNOSIS — I10 Essential (primary) hypertension: Secondary | ICD-10-CM | POA: Diagnosis not present

## 2014-07-30 DIAGNOSIS — M1990 Unspecified osteoarthritis, unspecified site: Secondary | ICD-10-CM | POA: Diagnosis not present

## 2014-08-06 DIAGNOSIS — Z1389 Encounter for screening for other disorder: Secondary | ICD-10-CM | POA: Diagnosis not present

## 2014-08-06 DIAGNOSIS — E785 Hyperlipidemia, unspecified: Secondary | ICD-10-CM | POA: Diagnosis not present

## 2014-08-06 DIAGNOSIS — F331 Major depressive disorder, recurrent, moderate: Secondary | ICD-10-CM | POA: Diagnosis not present

## 2014-08-06 DIAGNOSIS — J301 Allergic rhinitis due to pollen: Secondary | ICD-10-CM | POA: Diagnosis not present

## 2014-08-06 DIAGNOSIS — I10 Essential (primary) hypertension: Secondary | ICD-10-CM | POA: Diagnosis not present

## 2014-08-06 DIAGNOSIS — Z23 Encounter for immunization: Secondary | ICD-10-CM | POA: Diagnosis not present

## 2014-09-22 DIAGNOSIS — H40033 Anatomical narrow angle, bilateral: Secondary | ICD-10-CM | POA: Diagnosis not present

## 2014-09-22 DIAGNOSIS — H1013 Acute atopic conjunctivitis, bilateral: Secondary | ICD-10-CM | POA: Diagnosis not present

## 2015-01-30 DIAGNOSIS — F331 Major depressive disorder, recurrent, moderate: Secondary | ICD-10-CM | POA: Diagnosis not present

## 2015-01-30 DIAGNOSIS — I10 Essential (primary) hypertension: Secondary | ICD-10-CM | POA: Diagnosis not present

## 2015-01-30 DIAGNOSIS — M1990 Unspecified osteoarthritis, unspecified site: Secondary | ICD-10-CM | POA: Diagnosis not present

## 2015-01-30 DIAGNOSIS — E785 Hyperlipidemia, unspecified: Secondary | ICD-10-CM | POA: Diagnosis not present

## 2015-02-06 DIAGNOSIS — E785 Hyperlipidemia, unspecified: Secondary | ICD-10-CM | POA: Diagnosis not present

## 2015-02-06 DIAGNOSIS — J301 Allergic rhinitis due to pollen: Secondary | ICD-10-CM | POA: Diagnosis not present

## 2015-02-06 DIAGNOSIS — F331 Major depressive disorder, recurrent, moderate: Secondary | ICD-10-CM | POA: Diagnosis not present

## 2015-03-27 DIAGNOSIS — Z96652 Presence of left artificial knee joint: Secondary | ICD-10-CM | POA: Diagnosis not present

## 2015-03-27 DIAGNOSIS — Z471 Aftercare following joint replacement surgery: Secondary | ICD-10-CM | POA: Diagnosis not present

## 2015-03-29 ENCOUNTER — Other Ambulatory Visit: Payer: Self-pay

## 2015-03-29 DIAGNOSIS — Z1231 Encounter for screening mammogram for malignant neoplasm of breast: Secondary | ICD-10-CM

## 2015-05-02 ENCOUNTER — Ambulatory Visit
Admission: RE | Admit: 2015-05-02 | Discharge: 2015-05-02 | Disposition: A | Discharge status: Home / Self Care | Payer: Medicare Other | Source: Ambulatory Visit

## 2015-05-02 DIAGNOSIS — Z1231 Encounter for screening mammogram for malignant neoplasm of breast: Secondary | ICD-10-CM

## 2015-06-11 DIAGNOSIS — M1711 Unilateral primary osteoarthritis, right knee: Secondary | ICD-10-CM | POA: Diagnosis not present

## 2015-06-17 ENCOUNTER — Ambulatory Visit: Payer: Self-pay | Admitting: Orthopedic Surgery

## 2015-06-17 NOTE — Progress Notes (Signed)
Preoperative surgical orders have been place into the Epic hospital system for Christina Zhang on 06/17/2015, 11:22 AM  by Mickel Crow for surgery on 07-22-15.  Preop Total Knee orders including Experal, IV Tylenol, and IV Decadron as long as there are no contraindications to the above medications. Arlee Muslim, PA-C

## 2015-07-01 DIAGNOSIS — M1711 Unilateral primary osteoarthritis, right knee: Secondary | ICD-10-CM | POA: Diagnosis not present

## 2015-07-11 NOTE — Patient Instructions (Addendum)
YOUR PROCEDURE IS SCHEDULED ON :  07/22/15  REPORT TO [place] MAIN ENTRANCE FOLLOW SIGNS TO EAST ELEVATOR - GO TO 3rd FLOOR CHECK IN AT 3 EAST NURSES STATION (SHORT STAY) AT:  11:45 AM  CALL THIS NUMBER IF YOU HAVE PROBLEMS THE MORNING OF SURGERY [phone]  REMEMBER:ONLY 1 PER PERSON MAY GO TO SHORT STAY WITH YOU TO GET READY THE MORNING OF YOUR SURGERY  DO NOT EAT FOOD  AFTER MIDNIGHT  MAY HAVE CLEAR LIQUIDS UNTIL 8:45 AM  CLEAR LIQUID DIET  Foods Allowed                                                                     Foods Excluded  Coffee and tea, regular and decaf                             liquids that you cannot  Plain Jell-O in any flavor                                             see through such as: Fruit ices (not with fruit pulp)                                     milk, soups, orange juice  Iced Popsicles                                                        All solid food Carbonated beverages, regular and diet                                    Cranberry, grape and apple juices Sports drinks like Gatorade Lightly seasoned clear broth or consume(fat free) Sugar, honey syrup  TAKE THESE MEDICINES THE MORNING OF SURGERY: ZOLOFT  YOU MAY NOT HAVE ANY METAL ON YOUR BODY INCLUDING HAIR PINS AND PIERCING'S. DO NOT WEAR JEWELRY, MAKEUP, LOTIONS, POWDERS OR PERFUMES. DO NOT WEAR NAIL POLISH. DO NOT SHAVE 48 HRS PRIOR TO SURGERY. MEN MAY SHAVE FACE AND NECK.  DO NOT [place]. [facility] IS NOT RESPONSIBLE FOR VALUABLES.  CONTACTS, DENTURES OR PARTIALS MAY NOT BE WORN TO SURGERY. LEAVE SUITCASE IN CAR. CAN BE BROUGHT TO ROOM AFTER SURGERY.  PATIENTS DISCHARGED THE DAY OF SURGERY WILL NOT BE ALLOWED TO DRIVE HOME.  PLEASE READ OVER THE FOLLOWING INSTRUCTION SHEETS _________________________________________________________________________________                                          [facility] - PREPARING FOR  SURGERY  Before surgery, you can play an important role.  Because skin is not  sterile, your skin needs to be as free of germs as possible.  You can reduce the number of germs on your skin by washing with CHG (chlorahexidine gluconate) soap before surgery.  CHG is an antiseptic cleaner which kills germs and bonds with the skin to continue killing germs even after washing. Please DO NOT use if you have an allergy to CHG or antibacterial soaps.  If your skin becomes reddened/irritated stop using the CHG and inform your nurse when you arrive at Short Stay. Do not shave (including legs and underarms) for at least 48 hours prior to the first CHG shower.  You may shave your face. Please follow these instructions carefully:   1.  Shower with CHG Soap the night before surgery and the  morning of Surgery.   2.  If you choose to wash your hair, wash your hair first as usual with your  normal  Shampoo.   3.  After you shampoo, rinse your hair and body thoroughly to remove the  shampoo.                                         4.  Use CHG as you would any other liquid soap.  You can apply chg directly  to the skin and wash . Gently wash with scrungie or clean wascloth    5.  Apply the CHG Soap to your body ONLY FROM THE NECK DOWN.   Do not use on open                           Wound or open sores. Avoid contact with eyes, ears mouth and genitals (private parts).                        Genitals (private parts) with your normal soap.              6.  Wash thoroughly, paying special attention to the area where your surgery  will be performed.   7.  Thoroughly rinse your body with warm water from the neck down.   8.  DO NOT shower/wash with your normal soap after using and rinsing off  the CHG Soap .                9.  Pat yourself dry with a clean towel.             10.  Wear clean night clothes to bed after shower             11.  Place clean sheets on your bed the night of your first shower and do not   sleep with pets.  Day of Surgery : Do not apply any lotions/deodorants the morning of surgery.  Please wear clean clothes to the hospital/surgery center.  FAILURE TO FOLLOW THESE INSTRUCTIONS MAY RESULT IN THE CANCELLATION OF YOUR SURGERY    PATIENT SIGNATURE_________________________________  ______________________________________________________________________     Adam Phenix  An incentive spirometer is a tool that can help keep your lungs clear and active. This tool measures how well you are filling your lungs with each breath. Taking long deep breaths may help reverse or decrease the chance of developing breathing (pulmonary) problems (especially infection) following:  A long period of time when you are unable to move or be active. BEFORE  THE PROCEDURE   If the spirometer includes an indicator to show your best effort, your nurse or respiratory therapist will set it to a desired goal.  If possible, sit up straight or lean slightly forward. Try not to slouch.  Hold the incentive spirometer in an upright position. INSTRUCTIONS FOR USE   Sit on the edge of your bed if possible, or sit up as far as you can in bed or on a chair.  Hold the incentive spirometer in an upright position.  Breathe out normally.  Place the mouthpiece in your mouth and seal your lips tightly around it.  Breathe in slowly and as deeply as possible, raising the piston or the ball toward the top of the column.  Hold your breath for 3-5 seconds or for as long as possible. Allow the piston or ball to fall to the bottom of the column.  Remove the mouthpiece from your mouth and breathe out normally.  Rest for a few seconds and repeat Steps 1 through 7 at least 10 times every 1-2 hours when you are awake. Take your time and take a few normal breaths between deep breaths.  The spirometer may include an indicator to show your best effort. Use the indicator as a goal to work toward during each  repetition.  After each set of 10 deep breaths, practice coughing to be sure your lungs are clear. If you have an incision (the cut made at the time of surgery), support your incision when coughing by placing a pillow or rolled up towels firmly against it. Once you are able to get out of bed, walk around indoors and cough well. You may stop using the incentive spirometer when instructed by your caregiver.  RISKS AND COMPLICATIONS  Take your time so you do not get dizzy or light-headed.  If you are in pain, you may need to take or ask for pain medication before doing incentive spirometry. It is harder to take a deep breath if you are having pain. AFTER USE  Rest and breathe slowly and easily.  It can be helpful to keep track of a log of your progress. Your caregiver can provide you with a simple table to help with this. If you are using the spirometer at home, follow these instructions: [place] IF:   You are having difficultly using the spirometer.  You have trouble using the spirometer as often as instructed.  Your pain medication is not giving enough relief while using the spirometer.  You develop fever of 100.5 F (38.1 C) or higher. SEEK IMMEDIATE MEDICAL CARE IF:   You cough up bloody sputum that had not been present before.  You develop fever of 102 F (38.9 C) or greater.  You develop worsening pain at or near the incision site. MAKE SURE YOU:   Understand these instructions.  Will watch your condition.  Will get help right away if you are not doing well or get worse. Document Released: 09/07/2006 Document Revised: 07/20/2011 Document Reviewed: 11/08/2006 Exit[employer] Patient Information 2014 [employer], [place].   ________________________________________________________________________  WHAT IS A BLOOD TRANSFUSION? Blood Transfusion Information  A transfusion is the replacement of blood or some of its parts. Blood is made up of multiple cells which provide  different functions.  Red blood cells carry oxygen and are used for blood loss replacement.  White blood cells fight against infection.  Platelets control bleeding.  Plasma helps clot blood.  Other blood products are available for specialized needs, such as  hemophilia or other clotting disorders. BEFORE THE TRANSFUSION  Who gives blood for transfusions?   Healthy volunteers who are fully evaluated to make sure their blood is safe. This is blood bank blood. Transfusion therapy is the safest it has ever been in the practice of medicine. Before blood is taken from a donor, a complete history is taken to make sure that person has no history of diseases nor engages in risky social behavior (examples are intravenous drug use or sexual activity with multiple partners). The donor's travel history is screened to minimize risk of transmitting infections, such as malaria. The donated blood is tested for signs of infectious diseases, such as HIV and hepatitis. The blood is then tested to be sure it is compatible with you in order to minimize the chance of a transfusion reaction. If you or a relative donates blood, this is often done in anticipation of surgery and is not appropriate for emergency situations. It takes many days to process the donated blood. RISKS AND COMPLICATIONS Although transfusion therapy is very safe and saves many lives, the main dangers of transfusion include:   Getting an infectious disease.  Developing a transfusion reaction. This is an allergic reaction to something in the blood you were given. Every precaution is taken to prevent this. The decision to have a blood transfusion has been considered carefully by your caregiver before blood is given. Blood is not given unless the benefits outweigh the risks. AFTER THE TRANSFUSION  Right after receiving a blood transfusion, you will usually feel much better and more energetic. This is especially true if your red blood cells have  gotten low (anemic). The transfusion raises the level of the red blood cells which carry oxygen, and this usually causes an energy increase.  The nurse administering the transfusion will monitor you carefully for complications. HOME CARE INSTRUCTIONS  No special instructions are needed after a transfusion. You may find your energy is better. Speak with your caregiver about any limitations on activity for underlying diseases you may have. SEEK MEDICAL CARE IF:   Your condition is not improving after your transfusion.  You develop redness or irritation at the intravenous (IV) site. SEEK IMMEDIATE MEDICAL CARE IF:  Any of the following symptoms occur over the next 12 hours:  Shaking chills.  You have a temperature by mouth above 102 F (38.9 C), not controlled by medicine.  Chest, back, or muscle pain.  People around you feel you are not acting correctly or are confused.  Shortness of breath or difficulty breathing.  Dizziness and fainting.  You get a rash or develop hives.  You have a decrease in urine output.  Your urine turns a dark color or changes to pink, red, or brown. Any of the following symptoms occur over the next 10 days:  You have a temperature by mouth above 102 F (38.9 C), not controlled by medicine.  Shortness of breath.  Weakness after normal activity.  The white part of the eye turns yellow (jaundice).  You have a decrease in the amount of urine or are urinating less often.  Your urine turns a dark color or changes to pink, red, or brown. Document Released: 04/24/2000 Document Revised: 07/20/2011 Document Reviewed: 12/12/2007 [facility] Patient Information 2014 [place], [place].  _______________________________________________________________________

## 2015-07-12 ENCOUNTER — Other Ambulatory Visit: Payer: Self-pay

## 2015-07-12 ENCOUNTER — Encounter (HOSPITAL_COMMUNITY)
Admission: RE | Admit: 2015-07-12 | Discharge: 2015-07-12 | Disposition: A | Discharge status: Home / Self Care | Payer: Medicare Other | Source: Ambulatory Visit | Attending: Orthopedic Surgery | Admitting: Orthopedic Surgery

## 2015-07-12 ENCOUNTER — Encounter (HOSPITAL_COMMUNITY): Payer: Self-pay

## 2015-07-12 DIAGNOSIS — M1711 Unilateral primary osteoarthritis, right knee: Secondary | ICD-10-CM | POA: Diagnosis not present

## 2015-07-12 DIAGNOSIS — Z0183 Encounter for blood typing: Secondary | ICD-10-CM | POA: Diagnosis not present

## 2015-07-12 DIAGNOSIS — I10 Essential (primary) hypertension: Secondary | ICD-10-CM | POA: Diagnosis not present

## 2015-07-12 DIAGNOSIS — Z01812 Encounter for preprocedural laboratory examination: Secondary | ICD-10-CM | POA: Diagnosis not present

## 2015-07-12 DIAGNOSIS — I493 Ventricular premature depolarization: Secondary | ICD-10-CM | POA: Insufficient documentation

## 2015-07-12 DIAGNOSIS — Z01818 Encounter for other preprocedural examination: Secondary | ICD-10-CM | POA: Diagnosis not present

## 2015-07-12 LAB — URINALYSIS, ROUTINE W REFLEX MICROSCOPIC
Bilirubin Urine: NEGATIVE
Glucose, UA: NEGATIVE mg/dL
Hgb urine dipstick: NEGATIVE
KETONES UR: NEGATIVE mg/dL
LEUKOCYTES UA: NEGATIVE
NITRITE: NEGATIVE
PH: 5.5 (ref 5.0–8.0)
PROTEIN: NEGATIVE mg/dL
Specific Gravity, Urine: 1.016 (ref 1.005–1.030)

## 2015-07-12 LAB — COMPREHENSIVE METABOLIC PANEL
ALT: 19 U/L (ref 14–54)
ANION GAP: 11 (ref 5–15)
AST: 26 U/L (ref 15–41)
Albumin: 4.5 g/dL (ref 3.5–5.0)
Alkaline Phosphatase: 104 U/L (ref 38–126)
BILIRUBIN TOTAL: 0.6 mg/dL (ref 0.3–1.2)
BUN: 13 mg/dL (ref 6–20)
CHLORIDE: 102 mmol/L (ref 101–111)
CO2: 26 mmol/L (ref 22–32)
Calcium: 10 mg/dL (ref 8.9–10.3)
Creatinine, Ser: 0.7 mg/dL (ref 0.44–1.00)
GFR calc Af Amer: >60 mL/min (ref >60)
Glucose, Bld: 104 mg/dL — ABNORMAL HIGH (ref 65–99)
POTASSIUM: 3.7 mmol/L (ref 3.5–5.1)
Sodium: 139 mmol/L (ref 135–145)
Total Protein: 8.2 g/dL — ABNORMAL HIGH (ref 6.5–8.1)

## 2015-07-12 LAB — CBC
HEMATOCRIT: 43.1 % (ref 36.0–46.0)
Hemoglobin: 14.2 g/dL (ref 12.0–15.0)
MCH: 30.4 pg (ref 26.0–34.0)
MCHC: 32.9 g/dL (ref 30.0–36.0)
MCV: 92.3 fL (ref 78.0–100.0)
PLATELETS: 428 10*3/uL — AB (ref 150–400)
RBC: 4.67 MIL/uL (ref 3.87–5.11)
RDW: 12.6 % (ref 11.5–15.5)
WBC: 7.7 10*3/uL (ref 4.0–10.5)

## 2015-07-12 LAB — SURGICAL PCR SCREEN
MRSA, PCR: NEGATIVE
STAPHYLOCOCCUS AUREUS: NEGATIVE

## 2015-07-12 LAB — ABO/RH: ABO/RH(D): A POS

## 2015-07-12 LAB — PROTIME-INR
INR: 1.02 (ref 0.00–1.49)
Prothrombin Time: 13.6 seconds (ref 11.6–15.2)

## 2015-07-12 LAB — APTT: APTT: 30 s (ref 24–37)

## 2015-07-17 NOTE — H&P (Signed)
TOTAL KNEE ADMISSION H&P  Patient is being admitted for right total knee arthroplasty.  Subjective:  Chief Complaint:right knee pain.  HPI: Christina Zhang, 70 y.o. female, has a history of pain and functional disability in the right knee due to arthritis and has failed non-surgical conservative treatments for greater than 12 weeks to includeNSAID's and/or analgesics, corticosteriod injections, viscosupplementation injections and activity modification.  Onset of symptoms was gradual, starting >10 years ago with gradually worsening course since that time. The patient noted prior procedures on the knee to include  arthroscopy and menisectomy on the right knee(s).  Patient currently rates pain in the right knee(s) at 8 out of 10 with activity. Patient has night pain, worsening of pain with activity and weight bearing, pain that interferes with activities of daily living, pain with passive range of motion, crepitus and joint swelling.  Patient has evidence of periarticular osteophytes and joint space narrowing by imaging studies.  There is no active infection.  Patient Active Problem List   Diagnosis Date Noted  . Postop Hypokalemia 12/11/2011  . OA (osteoarthritis) of knee 12/09/2011   Past Medical History  Diagnosis Date  . PONV (postoperative nausea and vomiting)   . Hypertension   . Depression   . Arthritis   . Diverticulosis     Past Surgical History  Procedure Laterality Date  . Joint replacement    . Knee arthroscopy      right   . Tubal ligation    . Carpal tunnel release      right   . Total knee arthroplasty  12/09/2011    Procedure: TOTAL KNEE ARTHROPLASTY;  Surgeon: Gearlean Alf, MD;  Location: [facility] ORS;  Service: Orthopedics;  Laterality: Left;      Current outpatient prescriptions:  .  aspirin EC 81 MG tablet, Take 81 mg by mouth daily., Disp: , Rfl:  .  cholecalciferol (VITAMIN D) 1000 units tablet, Take 1,000 Units by mouth daily., Disp: , Rfl:  .   hydrochlorothiazide (HYDRODIURIL) 25 MG tablet, Take 12.5 mg by mouth every morning., Disp: , Rfl:  .  sertraline (ZOLOFT) 100 MG tablet, Take 100 mg by mouth daily., Disp: , Rfl:    Allergies  Allergen Reactions  . Metronidazole Other (See Comments)    Reaction=lips swelled  . Oxycodone     "MADE MY HEART RACE"  . Tramadol Nausea And Vomiting  . Vicodin [Hydrocodone-Acetaminophen] Nausea And Vomiting  . Ciprofloxacin Rash    Social History  Substance Use Topics  . Smoking status: Never Smoker   . Smokeless tobacco: Never Used  . Alcohol Use: No      Review of Systems  Constitutional: Negative.   HENT: Negative.   Eyes: Negative.   Respiratory: Negative.   Cardiovascular: Negative.   Gastrointestinal: Negative.   Genitourinary: Negative.   Musculoskeletal: Positive for myalgias and joint pain. Negative for back pain, falls and neck pain.       Right knee pain  Skin: Negative.   Neurological: Negative.   Endo/Heme/Allergies: Negative.   Psychiatric/Behavioral: Positive for depression. Negative for suicidal ideas, hallucinations, memory loss and substance abuse. The patient is not nervous/anxious and does not have insomnia.     Objective:  Physical Exam  Constitutional: She is oriented to person, place, and time. She appears well-developed. No distress.  Obese  HENT:  Head: Normocephalic and atraumatic.  Right Ear: External ear normal.  Left Ear: External ear normal.  Nose: Nose normal.  Mouth/Throat: Oropharynx is clear and  moist.  Eyes: Conjunctivae and EOM are normal.  Neck: Normal range of motion. Neck supple.  Cardiovascular: Normal rate, regular rhythm, normal heart sounds and intact distal pulses.   No murmur heard. Respiratory: Effort normal and breath sounds normal. No respiratory distress. She has no wheezes.  GI: Soft. Bowel sounds are normal. She exhibits no distension. There is no tenderness.  Musculoskeletal:  Her right hip has normal motion or  discomfort. Right knee shows slight valgus, ranged about 5 to 120. Marked crepitus and range of motion. Tenderness, lateral and medial with no instability. Left knee ranged 0 to 125, but she does have crepitus when she goes from a flexed to an extended position.  Neurological: She is alert and oriented to person, place, and time. She has normal strength and normal reflexes. No sensory deficit.  Skin: No rash noted. She is not diaphoretic. No erythema.  Psychiatric: She has a normal mood and affect. Her behavior is normal.    Vitals  Weight: 215 lb Height: 66.5in Body Surface Area: 2.07 m Body Mass Index: 34.18 kg/m  Pulse: 96 (Regular)  BP: 144/86 (Sitting, Left Arm, Standard)   Imaging Review Plain radiographs demonstrate severe degenerative joint disease of the right knee(s). The overall alignment ismild valgus. The bone quality appears to be good for age and reported activity level.  Assessment/Plan:  End stage primary osteoarthritis, right knee   The patient history, physical examination, clinical judgment of the provider and imaging studies are consistent with end stage degenerative joint disease of the right knee(s) and total knee arthroplasty is deemed medically necessary. The treatment options including medical management, injection therapy arthroscopy and arthroplasty were discussed at length. The risks and benefits of total knee arthroplasty were presented and reviewed. The risks due to aseptic loosening, infection, stiffness, patella tracking problems, thromboembolic complications and other imponderables were discussed. The patient acknowledged the explanation, agreed to proceed with the plan and consent was signed. Patient is being admitted for inpatient treatment for surgery, pain control, PT, OT, prophylactic antibiotics, VTE prophylaxis, progressive ambulation and ADL's and discharge planning. The patient is planning to be discharged home with home health  services   PCP: Dr. Gar Ponto    Ardeen Jourdain, PA-C

## 2015-07-19 NOTE — Progress Notes (Signed)
Pt notified of surgery time change to 1:45 pm - instructed to arrive at 10:45 am and clear liquids until 7:45 am

## 2015-07-22 ENCOUNTER — Encounter (HOSPITAL_COMMUNITY): Payer: Self-pay | Admitting: *Deleted

## 2015-07-22 ENCOUNTER — Inpatient Hospital Stay (HOSPITAL_COMMUNITY): Payer: Medicare Other | Admitting: Anesthesiology

## 2015-07-22 ENCOUNTER — Inpatient Hospital Stay (HOSPITAL_COMMUNITY)
Admission: RE | Admit: 2015-07-22 | Discharge: 2015-07-24 | DRG: 470 | Disposition: A | Discharge status: Home Health | Payer: Medicare Other | Source: Ambulatory Visit | Attending: Orthopedic Surgery | Admitting: Orthopedic Surgery

## 2015-07-22 ENCOUNTER — Encounter (HOSPITAL_COMMUNITY)
Admission: RE | Disposition: A | Discharge status: Home Health | Payer: Self-pay | Source: Ambulatory Visit | Attending: Orthopedic Surgery

## 2015-07-22 DIAGNOSIS — Z79899 Other long term (current) drug therapy: Secondary | ICD-10-CM | POA: Diagnosis not present

## 2015-07-22 DIAGNOSIS — Z01812 Encounter for preprocedural laboratory examination: Secondary | ICD-10-CM

## 2015-07-22 DIAGNOSIS — Z7982 Long term (current) use of aspirin: Secondary | ICD-10-CM

## 2015-07-22 DIAGNOSIS — I10 Essential (primary) hypertension: Secondary | ICD-10-CM | POA: Diagnosis present

## 2015-07-22 DIAGNOSIS — M1711 Unilateral primary osteoarthritis, right knee: Principal | ICD-10-CM | POA: Diagnosis present

## 2015-07-22 DIAGNOSIS — Z96652 Presence of left artificial knee joint: Secondary | ICD-10-CM | POA: Diagnosis present

## 2015-07-22 DIAGNOSIS — M179 Osteoarthritis of knee, unspecified: Secondary | ICD-10-CM | POA: Diagnosis not present

## 2015-07-22 DIAGNOSIS — M1710 Unilateral primary osteoarthritis, unspecified knee: Secondary | ICD-10-CM | POA: Diagnosis present

## 2015-07-22 DIAGNOSIS — M25561 Pain in right knee: Secondary | ICD-10-CM | POA: Diagnosis not present

## 2015-07-22 DIAGNOSIS — F329 Major depressive disorder, single episode, unspecified: Secondary | ICD-10-CM | POA: Diagnosis present

## 2015-07-22 HISTORY — PX: TOTAL KNEE ARTHROPLASTY: SHX125

## 2015-07-22 LAB — TYPE AND SCREEN
ABO/RH(D): A POS
ANTIBODY SCREEN: NEGATIVE

## 2015-07-22 SURGERY — ARTHROPLASTY, KNEE, TOTAL
Anesthesia: Spinal | Site: Knee | Laterality: Right

## 2015-07-22 MED ORDER — DEXAMETHASONE SODIUM PHOSPHATE 10 MG/ML IJ SOLN
10.0000 mg | Freq: Once | INTRAMUSCULAR | Status: AC
  Administered 2015-07-22: 10 mg via INTRAVENOUS

## 2015-07-22 MED ORDER — ONDANSETRON HCL 4 MG/2ML IJ SOLN
INTRAMUSCULAR | Status: DC | PRN
  Administered 2015-07-22: 4 mg via INTRAVENOUS

## 2015-07-22 MED ORDER — BUPIVACAINE HCL (PF) 0.75 % IJ SOLN
INTRAMUSCULAR | Status: DC | PRN
  Administered 2015-07-22: 2 mL via INTRATHECAL

## 2015-07-22 MED ORDER — LACTATED RINGERS IV SOLN
INTRAVENOUS | Status: DC | PRN
  Administered 2015-07-22 (×2): via INTRAVENOUS

## 2015-07-22 MED ORDER — POLYETHYLENE GLYCOL 3350 17 G PO PACK: 17.0000 g | PACK | Freq: Every day | ORAL | Status: DC | PRN

## 2015-07-22 MED ORDER — NON FORMULARY
Status: DC | PRN
  Administered 2015-07-22: 3000 mg

## 2015-07-22 MED ORDER — MENTHOL 3 MG MT LOZG: 1.0000 | LOZENGE | OROMUCOSAL | Status: DC | PRN

## 2015-07-22 MED ORDER — PROPOFOL 500 MG/50ML IV EMUL
INTRAVENOUS | Status: DC | PRN
Start: 2015-07-22 — End: 2015-07-22
  Administered 2015-07-22: 70 ug/kg/min via INTRAVENOUS

## 2015-07-22 MED ORDER — TRANEXAMIC ACID 1000 MG/10ML IV SOLN
1000.0000 mg | INTRAVENOUS | Status: AC
  Administered 2015-07-22: 1000 mg via INTRAVENOUS
  Filled 2015-07-22: qty 10

## 2015-07-22 MED ORDER — ACETAMINOPHEN 650 MG RE SUPP: 650.0000 mg | Freq: Four times a day (QID) | RECTAL | Status: DC | PRN

## 2015-07-22 MED ORDER — DEXTROSE 5 % IV SOLN
500.0000 mg | Freq: Four times a day (QID) | INTRAVENOUS | Status: DC | PRN
  Administered 2015-07-22: 500 mg via INTRAVENOUS
  Filled 2015-07-22 (×2): qty 5

## 2015-07-22 MED ORDER — LACTATED RINGERS IV SOLN
INTRAVENOUS | Status: DC
  Administered 2015-07-22: 1000 mL via INTRAVENOUS

## 2015-07-22 MED ORDER — BUPIVACAINE HCL (PF) 0.25 % IJ SOLN
INTRAMUSCULAR | Status: AC
  Filled 2015-07-22: qty 30

## 2015-07-22 MED ORDER — LABETALOL HCL 5 MG/ML IV SOLN
INTRAVENOUS | Status: AC
  Filled 2015-07-22: qty 8

## 2015-07-22 MED ORDER — PROPOFOL 10 MG/ML IV BOLUS
INTRAVENOUS | Status: AC
  Filled 2015-07-22: qty 20

## 2015-07-22 MED ORDER — ONDANSETRON HCL 4 MG PO TABS
4.0000 mg | ORAL_TABLET | Freq: Four times a day (QID) | ORAL | Status: DC | PRN
  Administered 2015-07-23 – 2015-07-24 (×2): 4 mg via ORAL
  Filled 2015-07-22 (×2): qty 1

## 2015-07-22 MED ORDER — PHENOL 1.4 % MT LIQD
1.0000 | OROMUCOSAL | Status: DC | PRN
Start: 2015-07-22 — End: 2015-07-24

## 2015-07-22 MED ORDER — HYDROMORPHONE HCL 1 MG/ML IJ SOLN
0.5000 mg | INTRAMUSCULAR | Status: DC | PRN
  Administered 2015-07-22 (×3): 0.5 mg via INTRAVENOUS
  Filled 2015-07-22 (×2): qty 1

## 2015-07-22 MED ORDER — ACETAMINOPHEN 500 MG PO TABS
1000.0000 mg | ORAL_TABLET | Freq: Four times a day (QID) | ORAL | Status: AC
  Administered 2015-07-22: 1000 mg via ORAL
  Administered 2015-07-23: 500 mg via ORAL
  Administered 2015-07-23 (×2): 1000 mg via ORAL
  Filled 2015-07-22 (×4): qty 2

## 2015-07-22 MED ORDER — DEXAMETHASONE SODIUM PHOSPHATE 10 MG/ML IJ SOLN
10.0000 mg | Freq: Once | INTRAMUSCULAR | Status: AC
  Administered 2015-07-23: 10 mg via INTRAVENOUS
  Filled 2015-07-22: qty 1

## 2015-07-22 MED ORDER — CHLORHEXIDINE GLUCONATE 4 % EX LIQD: 60.0000 mL | Freq: Once | CUTANEOUS | Status: DC

## 2015-07-22 MED ORDER — FENTANYL CITRATE (PF) 100 MCG/2ML IJ SOLN: 25.0000 ug | INTRAMUSCULAR | Status: DC | PRN

## 2015-07-22 MED ORDER — ACETAMINOPHEN 10 MG/ML IV SOLN
1000.0000 mg | Freq: Once | INTRAVENOUS | Status: AC
  Administered 2015-07-22: 1000 mg via INTRAVENOUS
  Filled 2015-07-22: qty 100

## 2015-07-22 MED ORDER — FENTANYL CITRATE (PF) 100 MCG/2ML IJ SOLN
INTRAMUSCULAR | Status: AC
  Filled 2015-07-22: qty 2

## 2015-07-22 MED ORDER — TRANEXAMIC ACID 1000 MG/10ML IV SOLN
1000.0000 mg | Freq: Once | INTRAVENOUS | Status: AC
  Administered 2015-07-22: 1000 mg via INTRAVENOUS
  Filled 2015-07-22: qty 10

## 2015-07-22 MED ORDER — CEFAZOLIN SODIUM-DEXTROSE 2-3 GM-% IV SOLR
2.0000 g | Freq: Four times a day (QID) | INTRAVENOUS | Status: AC
  Administered 2015-07-22 – 2015-07-23 (×2): 2 g via INTRAVENOUS
  Filled 2015-07-22 (×2): qty 50

## 2015-07-22 MED ORDER — DOCUSATE SODIUM 100 MG PO CAPS
100.0000 mg | ORAL_CAPSULE | Freq: Two times a day (BID) | ORAL | Status: DC
  Administered 2015-07-22 – 2015-07-24 (×4): 100 mg via ORAL

## 2015-07-22 MED ORDER — RIVAROXABAN 10 MG PO TABS
10.0000 mg | ORAL_TABLET | Freq: Every day | ORAL | Status: DC
  Administered 2015-07-23 – 2015-07-24 (×2): 10 mg via ORAL
  Filled 2015-07-22 (×3): qty 1

## 2015-07-22 MED ORDER — SODIUM CHLORIDE 0.9 % IJ SOLN
INTRAMUSCULAR | Status: AC
  Filled 2015-07-22: qty 50

## 2015-07-22 MED ORDER — MIDAZOLAM HCL 2 MG/2ML IJ SOLN
INTRAMUSCULAR | Status: AC
  Filled 2015-07-22: qty 2

## 2015-07-22 MED ORDER — FLEET ENEMA 7-19 GM/118ML RE ENEM: 1.0000 | ENEMA | Freq: Once | RECTAL | Status: DC | PRN

## 2015-07-22 MED ORDER — BUPIVACAINE HCL 0.25 % IJ SOLN
INTRAMUSCULAR | Status: DC | PRN
  Administered 2015-07-22: 20 mL

## 2015-07-22 MED ORDER — HYDROMORPHONE HCL 2 MG PO TABS
2.0000 mg | ORAL_TABLET | ORAL | Status: DC | PRN
  Administered 2015-07-22: 2 mg via ORAL
  Administered 2015-07-23: 4 mg via ORAL
  Administered 2015-07-23 – 2015-07-24 (×6): 2 mg via ORAL
  Filled 2015-07-22: qty 1
  Filled 2015-07-22: qty 2
  Filled 2015-07-22 (×6): qty 1

## 2015-07-22 MED ORDER — FENTANYL CITRATE (PF) 100 MCG/2ML IJ SOLN
INTRAMUSCULAR | Status: DC | PRN
  Administered 2015-07-22: 75 ug via INTRAVENOUS
  Administered 2015-07-22: 25 ug via INTRAVENOUS

## 2015-07-22 MED ORDER — MIDAZOLAM HCL 5 MG/5ML IJ SOLN
INTRAMUSCULAR | Status: DC | PRN
  Administered 2015-07-22 (×2): 1 mg via INTRAVENOUS

## 2015-07-22 MED ORDER — METOCLOPRAMIDE HCL 5 MG/ML IJ SOLN
5.0000 mg | Freq: Three times a day (TID) | INTRAMUSCULAR | Status: DC | PRN
  Administered 2015-07-22: 10 mg via INTRAVENOUS
  Filled 2015-07-22: qty 2

## 2015-07-22 MED ORDER — BISACODYL 10 MG RE SUPP: 10.0000 mg | Freq: Every day | RECTAL | Status: DC | PRN

## 2015-07-22 MED ORDER — METOCLOPRAMIDE HCL 10 MG PO TABS: 5.0000 mg | ORAL_TABLET | Freq: Three times a day (TID) | ORAL | Status: DC | PRN

## 2015-07-22 MED ORDER — METHOCARBAMOL 500 MG PO TABS
500.0000 mg | ORAL_TABLET | Freq: Four times a day (QID) | ORAL | Status: DC | PRN
  Administered 2015-07-23 (×2): 500 mg via ORAL
  Filled 2015-07-22 (×2): qty 1

## 2015-07-22 MED ORDER — BUPIVACAINE LIPOSOME 1.3 % IJ SUSP
20.0000 mL | Freq: Once | INTRAMUSCULAR | Status: DC
  Filled 2015-07-22: qty 20

## 2015-07-22 MED ORDER — SODIUM CHLORIDE 0.9 % IJ SOLN
INTRAMUSCULAR | Status: DC | PRN
  Administered 2015-07-22: 30 mL

## 2015-07-22 MED ORDER — ACETAMINOPHEN 325 MG PO TABS
650.0000 mg | ORAL_TABLET | Freq: Four times a day (QID) | ORAL | Status: DC | PRN
  Administered 2015-07-23 – 2015-07-24 (×2): 650 mg via ORAL
  Filled 2015-07-22 (×2): qty 2

## 2015-07-22 MED ORDER — SODIUM CHLORIDE 0.9 % IV SOLN
INTRAVENOUS | Status: DC
  Administered 2015-07-22: 18:00:00 via INTRAVENOUS

## 2015-07-22 MED ORDER — ACETAMINOPHEN 10 MG/ML IV SOLN
INTRAVENOUS | Status: AC
  Filled 2015-07-22: qty 100

## 2015-07-22 MED ORDER — SODIUM CHLORIDE 0.9 % IV SOLN: INTRAVENOUS | Status: DC

## 2015-07-22 MED ORDER — BUPIVACAINE LIPOSOME 1.3 % IJ SUSP
INTRAMUSCULAR | Status: DC | PRN
  Administered 2015-07-22: 20 mL

## 2015-07-22 MED ORDER — ONDANSETRON HCL 4 MG/2ML IJ SOLN: 4.0000 mg | Freq: Once | INTRAMUSCULAR | Status: DC | PRN

## 2015-07-22 MED ORDER — PROMETHAZINE HCL 25 MG/ML IJ SOLN
6.2500 mg | Freq: Four times a day (QID) | INTRAMUSCULAR | Status: DC | PRN
  Administered 2015-07-22: 6.25 mg via INTRAVENOUS
  Filled 2015-07-22: qty 1

## 2015-07-22 MED ORDER — ONDANSETRON HCL 4 MG/2ML IJ SOLN
4.0000 mg | Freq: Four times a day (QID) | INTRAMUSCULAR | Status: DC | PRN
  Administered 2015-07-22: 4 mg via INTRAVENOUS
  Filled 2015-07-22: qty 2

## 2015-07-22 MED ORDER — SODIUM CHLORIDE 0.9 % IR SOLN
Status: DC | PRN
  Administered 2015-07-22: 1

## 2015-07-22 MED ORDER — PHENYLEPHRINE HCL 10 MG/ML IJ SOLN
INTRAMUSCULAR | Status: DC | PRN
  Administered 2015-07-22 (×7): 40 ug via INTRAVENOUS

## 2015-07-22 MED ORDER — SERTRALINE HCL 100 MG PO TABS
100.0000 mg | ORAL_TABLET | Freq: Every day | ORAL | Status: DC
  Administered 2015-07-23 – 2015-07-24 (×2): 100 mg via ORAL
  Filled 2015-07-22 (×2): qty 1

## 2015-07-22 MED ORDER — DIPHENHYDRAMINE HCL 12.5 MG/5ML PO ELIX: 12.5000 mg | ORAL_SOLUTION | ORAL | Status: DC | PRN

## 2015-07-22 MED ORDER — CEFAZOLIN SODIUM-DEXTROSE 2-3 GM-% IV SOLR
INTRAVENOUS | Status: AC
  Filled 2015-07-22: qty 50

## 2015-07-22 MED ORDER — CEFAZOLIN SODIUM-DEXTROSE 2-3 GM-% IV SOLR
2.0000 g | INTRAVENOUS | Status: AC
  Administered 2015-07-22: 2 g via INTRAVENOUS

## 2015-07-22 MED ORDER — HYDROCHLOROTHIAZIDE 25 MG PO TABS
12.5000 mg | ORAL_TABLET | Freq: Every morning | ORAL | Status: DC
  Administered 2015-07-23 – 2015-07-24 (×2): 12.5 mg via ORAL
  Filled 2015-07-22 (×2): qty 0.5

## 2015-07-22 SURGICAL SUPPLY — 51 items
BAG DECANTER FOR FLEXI CONT (MISCELLANEOUS) ×3 IMPLANT
BAG SPEC THK2 15X12 ZIP CLS (MISCELLANEOUS) ×1
BAG ZIPLOCK 12X15 (MISCELLANEOUS) ×3 IMPLANT
BANDAGE ACE 6X5 VEL STRL LF (GAUZE/BANDAGES/DRESSINGS) ×3 IMPLANT
BLADE SAG 18X100X1.27 (BLADE) ×3 IMPLANT
BLADE SAW SGTL 11.0X1.19X90.0M (BLADE) ×3 IMPLANT
BOWL SMART MIX CTS (DISPOSABLE) ×3 IMPLANT
CAP KNEE TOTAL 3 SIGMA ×2 IMPLANT
CEMENT HV SMART SET (Cement) ×6 IMPLANT
CLOSURE WOUND 1/2 X4 (GAUZE/BANDAGES/DRESSINGS) ×1
CLOTH BEACON ORANGE TIMEOUT ST (SAFETY) ×3 IMPLANT
CUFF TOURN SGL QUICK 34 (TOURNIQUET CUFF) ×3
CUFF TRNQT CYL 34X4X40X1 (TOURNIQUET CUFF) ×1 IMPLANT
DECANTER SPIKE VIAL GLASS SM (MISCELLANEOUS) ×3 IMPLANT
DRAPE U-SHAPE 47X51 STRL (DRAPES) ×3 IMPLANT
DRSG ADAPTIC 3X8 NADH LF (GAUZE/BANDAGES/DRESSINGS) ×3 IMPLANT
DRSG PAD ABDOMINAL 8X10 ST (GAUZE/BANDAGES/DRESSINGS) ×3 IMPLANT
DURAPREP 26ML APPLICATOR (WOUND CARE) ×3 IMPLANT
ELECT REM PT RETURN 9FT ADLT (ELECTROSURGICAL) ×3
ELECTRODE REM PT RTRN 9FT ADLT (ELECTROSURGICAL) ×1 IMPLANT
EVACUATOR 1/8 PVC DRAIN (DRAIN) ×3 IMPLANT
GAUZE SPONGE 4X4 12PLY STRL (GAUZE/BANDAGES/DRESSINGS) ×3 IMPLANT
GLOVE BIO SURGEON STRL SZ7.5 (GLOVE) IMPLANT
GLOVE BIO SURGEON STRL SZ8 (GLOVE) ×3 IMPLANT
GLOVE BIOGEL PI IND STRL 6.5 (GLOVE) IMPLANT
GLOVE BIOGEL PI IND STRL 8 (GLOVE) ×1 IMPLANT
GLOVE BIOGEL PI INDICATOR 6.5 (GLOVE)
GLOVE BIOGEL PI INDICATOR 8 (GLOVE) ×2
GLOVE SURG SS PI 6.5 STRL IVOR (GLOVE) IMPLANT
GOWN STRL REUS W/TWL LRG LVL3 (GOWN DISPOSABLE) ×3 IMPLANT
GOWN STRL REUS W/TWL XL LVL3 (GOWN DISPOSABLE) IMPLANT
HANDPIECE INTERPULSE COAX TIP (DISPOSABLE) ×3
IMMOBILIZER KNEE 20 (SOFTGOODS) ×3
IMMOBILIZER KNEE 20 THIGH 36 (SOFTGOODS) ×1 IMPLANT
MANIFOLD NEPTUNE II (INSTRUMENTS) ×3 IMPLANT
NS IRRIG 1000ML POUR BTL (IV SOLUTION) ×3 IMPLANT
PACK TOTAL KNEE CUSTOM (KITS) ×3 IMPLANT
PADDING CAST COTTON 6X4 STRL (CAST SUPPLIES) ×7 IMPLANT
POSITIONER SURGICAL ARM (MISCELLANEOUS) ×3 IMPLANT
SET HNDPC FAN SPRY TIP SCT (DISPOSABLE) ×1 IMPLANT
STRIP CLOSURE SKIN 1/2X4 (GAUZE/BANDAGES/DRESSINGS) ×3 IMPLANT
SUT MNCRL AB 4-0 PS2 18 (SUTURE) ×3 IMPLANT
SUT VIC AB 2-0 CT1 27 (SUTURE) ×9
SUT VIC AB 2-0 CT1 TAPERPNT 27 (SUTURE) ×3 IMPLANT
SUT VLOC 180 0 24IN GS25 (SUTURE) ×3 IMPLANT
SYR 50ML LL SCALE MARK (SYRINGE) ×3 IMPLANT
TRAY FOLEY W/METER SILVER 14FR (SET/KITS/TRAYS/PACK) ×3 IMPLANT
TRAY FOLEY W/METER SILVER 16FR (SET/KITS/TRAYS/PACK) ×3 IMPLANT
WATER STERILE IRR 1500ML POUR (IV SOLUTION) ×3 IMPLANT
WRAP KNEE MAXI GEL POST OP (GAUZE/BANDAGES/DRESSINGS) ×3 IMPLANT
YANKAUER SUCT BULB TIP 10FT TU (MISCELLANEOUS) ×3 IMPLANT

## 2015-07-22 NOTE — Op Note (Signed)
Pre-operative diagnosis- Osteoarthritis  Right knee(s)  Post-operative diagnosis- Osteoarthritis Right knee(s)  Procedure-  Right  Total Knee Arthroplasty  Surgeon- Dione Plover. Shacora Zynda, MD  Assistant- Arlee Muslim, PA-C   Anesthesia-  Spinal  EBL-* No blood loss amount entered *   Drains Hemovac  Tourniquet time-  Total Tourniquet Time Documented: Thigh (Right) - 37 minutes Total: Thigh (Right) - 37 minutes     Complications- None  Condition-PACU - hemodynamically stable.   Brief Clinical Note  Christina Zhang a 70 y.o. year old female with end stage OA of her right knee with progressively worsening pain and dysfunction. She has constant pain, with activity and at rest and significant functional deficits with difficulties even with ADLs. She has had extensive non-op management including analgesics, injections of cortisone and viscosupplements, and home exercise program, but remains in significant pain with significant dysfunction.Radiographs show bone on bone arthritis medial and patellofemoral. She presents now for right Total Knee Arthroplasty.    Procedure in detail---   The patient Zhang brought into the operating room and positioned supine on the operating table. After successful administration of  Spinal,   a tourniquet Zhang placed high on the  Right thigh(s) and the lower extremity Zhang prepped and draped in the usual sterile fashion. Time out Zhang performed by the operating team and then the  Right lower extremity Zhang wrapped in Esmarch, knee flexed and the tourniquet inflated to 300 mmHg.       A midline incision Zhang made with a ten blade through the subcutaneous tissue to the level of the extensor mechanism. A fresh blade Zhang used to make a medial parapatellar arthrotomy. Soft tissue over the proximal medial tibia Zhang subperiosteally elevated to the joint line with a knife and into the semimembranosus bursa with a Cobb elevator. Soft tissue over the proximal lateral tibia Zhang elevated with  attention being paid to avoiding the patellar tendon on the tibial tubercle. The patella Zhang everted, knee flexed 90 degrees and the ACL and PCL are removed. Findings are bone on bone medial and patellofemoral with large global osteophytes.        The drill Zhang used to create a starting hole in the distal femur and the canal Zhang thoroughly irrigated with sterile saline to remove the fatty contents. The 5 degree Right  valgus alignment guide Zhang placed into the femoral canal and the distal femoral cutting block Zhang pinned to remove 10 mm off the distal femur. Resection Zhang made with an oscillating saw.      The tibia Zhang subluxed forward and the menisci are removed. The extramedullary alignment guide Zhang placed referencing proximally at the medial aspect of the tibial tubercle and distally along the second metatarsal axis and tibial crest. The block Zhang pinned to remove 1mm off the more deficient medial  side. Resection Zhang made with an oscillating saw. Size 2.5is the most appropriate size for the tibia and the proximal tibia Zhang prepared with the modular drill and keel punch for that size.      The femoral sizing guide Zhang placed and size 3 Zhang most appropriate. Rotation Zhang marked off the epicondylar axis and confirmed by creating a rectangular flexion gap at 90 degrees. The size 3 cutting block Zhang pinned in this rotation and the anterior, posterior and chamfer cuts are made with the oscillating saw. The intercondylar block Zhang then placed and that cut Zhang made.      Trial size 2.5 tibial component, trial  size 3 posterior stabilized femur and a 12.5  mm posterior stabilized rotating platform insert trial Zhang placed. Full extension Zhang achieved with excellent varus/valgus and anterior/posterior balance throughout full range of motion. The patella Zhang everted and thickness measured to be 22  mm. Free hand resection Zhang taken to 12 mm, a 35 template Zhang placed, lug holes are drilled, trial patella Zhang placed, and it tracks normally.  Osteophytes are removed off the posterior femur with the trial in place. All trials are removed and the cut bone surfaces prepared with pulsatile lavage. Cement Zhang mixed and once ready for implantation, the size 2.5 tibial implant, size  3 posterior stabilized femoral component, and the size 35 patella are cemented in place and the patella Zhang held with the clamp. The trial insert Zhang placed and the knee held in full extension. The Exparel (20 ml mixed with 30 ml saline) and .25% Bupivicaine, are injected into the extensor mechanism, posterior capsule, medial and lateral gutters and subcutaneous tissues.  All extruded cement Zhang removed and once the cement Zhang hard the permanent 12.5 mm posterior stabilized rotating platform insert Zhang placed into the tibial tray.      The wound Zhang copiously irrigated with saline solution and the extensor mechanism closed over a hemovac drain with #1 V-loc suture. The tourniquet Zhang released for a total tourniquet time of 37  minutes. Flexion against gravity Zhang 140 degrees and the patella tracks normally. Subcutaneous tissue Zhang closed with 2.0 vicryl and subcuticular with running 4.0 Monocryl. The incision Zhang cleaned and dried and steri-strips and a bulky sterile dressing are applied. The limb Zhang placed into a knee immobilizer and the patient Zhang awakened and transported to recovery in stable condition.      Please note that a surgical assistant was a medical necessity for this procedure in order to perform it in a safe and expeditious manner. Surgical assistant was necessary to retract the ligaments and vital neurovascular structures to prevent injury to them and also necessary for proper positioning of the limb to allow for anatomic placement of the prosthesis.   Dione Plover Marcel Sorter, MD    07/22/2015, 2:28 PM

## 2015-07-22 NOTE — Anesthesia Postprocedure Evaluation (Signed)
Anesthesia Post Note  Patient: Christina Zhang  Procedure(s) Performed: Procedure(s) (LRB): RIGHT TOTAL KNEE ARTHROPLASTY (Right)  Patient location during evaluation: PACU Anesthesia Type: Spinal Level of consciousness: oriented and awake and alert Pain management: pain level controlled Vital Signs Assessment: post-procedure vital signs reviewed and stable Respiratory status: spontaneous breathing, respiratory function stable and patient connected to nasal cannula oxygen Cardiovascular status: blood pressure returned to baseline and stable Postop Assessment: no headache and no backache Anesthetic complications: no    Last Vitals:  Filed Vitals:   07/22/15 1515 07/22/15 1530  BP: 100/44 114/52  Pulse: 92 76  Temp:    Resp: 15 14    Last Pain:  Filed Vitals:   07/22/15 1531  PainSc: 1                  Zenaida Deed

## 2015-07-22 NOTE — Interval H&P Note (Signed)
History and Physical Interval Note:  07/22/2015 1:05 PM  Christina Zhang  has presented today for surgery, with the diagnosis of RIGHT KNEE OA   The various methods of treatment have been discussed with the patient and family. After consideration of risks, benefits and other options for treatment, the patient has consented to  Procedure(s): RIGHT TOTAL KNEE ARTHROPLASTY (Right) as a surgical intervention .  The patient's history has been reviewed, patient examined, no change in status, stable for surgery.  I have reviewed the patient's chart and labs.  Questions were answered to the patient's satisfaction.     Gearlean Alf

## 2015-07-22 NOTE — Anesthesia Procedure Notes (Addendum)
Spinal Patient location during procedure: OR Start time: 07/22/2015 1:09 PM End time: 07/22/2015 1:15 PM Staffing Resident/CRNA: Abrielle Finck Performed by: resident/CRNA  Spinal Block Patient position: sitting Prep: ChloraPrep Patient monitoring: heart rate, cardiac monitor, continuous pulse ox and blood pressure Location: L4-5 Injection technique: single-shot Needle Needle type: [place]  Needle gauge: 22 G

## 2015-07-22 NOTE — Progress Notes (Signed)
Utilization review completed.  

## 2015-07-22 NOTE — Transfer of Care (Signed)
Immediate Anesthesia Transfer of Care Note  Patient: Christina Zhang  Procedure(s) Performed: Procedure(s): RIGHT TOTAL KNEE ARTHROPLASTY (Right)  Patient Location: PACU  Anesthesia Type:Spinal  Level of Consciousness:  sedated, patient cooperative and responds to stimulation  Airway & Oxygen Therapy:Patient Spontanous Breathing and Patient connected to face mask oxgen  Post-op Assessment:  Report given to PACU RN and Post -op Vital signs reviewed and stable  Post vital signs:  Reviewed and stable  Last Vitals:  Filed Vitals:   07/22/15 1026  BP: 153/91  Pulse: 89  Temp: 36.3 C  Resp: 16    Complications: No apparent anesthesia complications

## 2015-07-22 NOTE — Anesthesia Preprocedure Evaluation (Signed)
Anesthesia Evaluation  Patient identified by MRN, date of birth, ID band Patient awake    Reviewed: Allergy & Precautions, H&P , NPO status , Patient's Chart, lab work & pertinent test results  History of Anesthesia Complications (+) PONV, AWARENESS UNDER ANESTHESIA and history of anesthetic complications  Airway Mallampati: II  TM Distance: >3 FB Neck ROM: Full    Dental  (+) Teeth Intact, Dental Advisory Given, Caps   Pulmonary neg pulmonary ROS,    Pulmonary exam normal breath sounds clear to auscultation       Cardiovascular hypertension, Normal cardiovascular exam Rhythm:Regular Rate:Normal     Neuro/Psych PSYCHIATRIC DISORDERS Depression negative neurological ROS     GI/Hepatic negative GI ROS, Neg liver ROS,   Endo/Other  negative endocrine ROS  Renal/GU negative Renal ROS  negative genitourinary   Musculoskeletal negative musculoskeletal ROS (+)   Abdominal   Peds  Hematology negative hematology ROS (+)   Anesthesia Other Findings   Reproductive/Obstetrics negative OB ROS                             Anesthesia Physical  Anesthesia Plan  ASA: II  Anesthesia Plan: Spinal   Post-op Pain Management:    Induction:   Airway Management Planned: Simple Face Mask  Additional Equipment:   Intra-op Plan:   Post-operative Plan: Extubation in OR  Informed Consent: I have reviewed the patients History and Physical, chart, labs and discussed the procedure including the risks, benefits and alternatives for the proposed anesthesia with the patient or authorized representative who has indicated his/her understanding and acceptance.   Dental advisory given  Plan Discussed with: CRNA  Anesthesia Plan Comments:         Anesthesia Quick Evaluation

## 2015-07-23 LAB — CBC
HCT: 32.9 % — ABNORMAL LOW (ref 36.0–46.0)
HEMOGLOBIN: 11.1 g/dL — AB (ref 12.0–15.0)
MCH: 30.2 pg (ref 26.0–34.0)
MCHC: 33.7 g/dL (ref 30.0–36.0)
MCV: 89.6 fL (ref 78.0–100.0)
PLATELETS: 357 10*3/uL (ref 150–400)
RBC: 3.67 MIL/uL — AB (ref 3.87–5.11)
RDW: 12.4 % (ref 11.5–15.5)
WBC: 11.6 10*3/uL — AB (ref 4.0–10.5)

## 2015-07-23 LAB — BASIC METABOLIC PANEL
ANION GAP: 9 (ref 5–15)
BUN: 14 mg/dL (ref 6–20)
CHLORIDE: 107 mmol/L (ref 101–111)
CO2: 26 mmol/L (ref 22–32)
Calcium: 8.8 mg/dL — ABNORMAL LOW (ref 8.9–10.3)
Creatinine, Ser: 0.68 mg/dL (ref 0.44–1.00)
GFR calc Af Amer: >60 mL/min (ref >60)
GLUCOSE: 140 mg/dL — AB (ref 65–99)
POTASSIUM: 4.1 mmol/L (ref 3.5–5.1)
Sodium: 142 mmol/L (ref 135–145)

## 2015-07-23 MED ORDER — RIVAROXABAN 10 MG PO TABS: 10.0000 mg | ORAL_TABLET | Freq: Every day | ORAL | Status: DC

## 2015-07-23 MED ORDER — HYDROMORPHONE HCL 2 MG PO TABS: 2.0000 mg | ORAL_TABLET | ORAL | Status: DC | PRN

## 2015-07-23 MED ORDER — METHOCARBAMOL 500 MG PO TABS: 500.0000 mg | ORAL_TABLET | Freq: Four times a day (QID) | ORAL | Status: DC | PRN

## 2015-07-23 NOTE — Evaluation (Signed)
Physical Therapy Evaluation Patient Details Name: Christina Zhang MRN: [id number] DOB: 01-14-1946 Today's Date: 07/23/2015   History of Present Illness  70 yo female s/p R TKA 07/22/15  Clinical Impression  On eval, pt required Min assist for mobility-walked ~15'x2 with RW. Distance limited by nausea. Will follow and progress activity as tolerated.     Follow Up Recommendations Home health PT    Equipment Recommendations  None recommended by PT    Recommendations for Other Services       Precautions / Restrictions Precautions Precautions: Fall;Knee Required Braces or Orthoses: Knee Immobilizer - Right Knee Immobilizer - Right: Discontinue once straight leg raise with < 10 degree lag Restrictions Weight Bearing Restrictions: No RLE Weight Bearing: Weight bearing as tolerated      Mobility  Bed Mobility Overal bed mobility: Needs Assistance Bed Mobility: Supine to Sit     Supine to sit: Min assist     General bed mobility comments: assist for R LE.   Transfers Overall transfer level: Needs assistance Equipment used: Rolling walker (2 wheeled) Transfers: Sit to/from Stand Sit to Stand: Min assist         General transfer comment: Assist to rise, stabilize, control descent. VCs safety, hand placement.   Ambulation/Gait Ambulation/Gait assistance: Min assist Ambulation Distance (Feet): 15 Feet (x2) Assistive device: Rolling walker (2 wheeled) Gait Pattern/deviations: Step-to pattern;Trunk flexed;Antalgic     General Gait Details: small amount of assist to stabilize. VCs safety, sequence. Pt experiending nausea-unable to tolerate hallway ambulation this session  Stairs            Wheelchair Mobility    Modified Rankin (Stroke Patients Only)       Balance                                             Pertinent Vitals/Pain Pain Assessment: 0-10 Pain Score: 5  Pain Location: R knee Pain Descriptors / Indicators:  Aching;Sore Pain Intervention(s): Monitored during session;Ice applied;Repositioned    Home Living                        Prior Function                 Hand Dominance        Extremity/Trunk Assessment   Upper Extremity Assessment: Defer to OT evaluation           Lower Extremity Assessment: RLE deficits/detail RLE Deficits / Details: hip flex 3-/5, moves ankle well    Cervical / Trunk Assessment: Normal  Communication      Cognition Arousal/Alertness: Awake/alert Behavior During Therapy: WFL for tasks assessed/performed Overall Cognitive Status: Within Functional Limits for tasks assessed                      General Comments      Exercises Total Joint Exercises Ankle Circles/Pumps: AROM;Both;10 reps;Supine Quad Sets: AROM;Both;10 reps;Supine Heel Slides: AAROM;Right;10 reps;Supine Hip ABduction/ADduction: AAROM;Right;10 reps;Supine Straight Leg Raises: AAROM;Right;10 reps;Supine Goniometric ROM: ~10-75 degrees      Assessment/Plan    PT Assessment Patient needs continued PT services  PT Diagnosis Difficulty walking;Acute pain   PT Problem List Decreased strength;Decreased range of motion;Decreased activity tolerance;Decreased balance;Decreased mobility;Pain;Decreased knowledge of use of DME  PT Treatment Interventions DME instruction;Gait training;Stair training;Functional mobility training;Therapeutic activities;Patient/family education;Therapeutic exercise;Balance training  PT Goals (Current goals can be found in the Care Plan section) Acute Rehab PT Goals Patient Stated Goal: home soon PT Goal Formulation: With patient Time For Goal Achievement: 07/30/15 Potential to Achieve Goals: Good    Frequency 7X/week   Barriers to discharge        Co-evaluation               End of Session   Activity Tolerance: Patient tolerated treatment well (but limited by nausea) Patient left: in chair;with call bell/phone within  reach;with chair alarm set           Time: [id number] PT Time Calculation (min) (ACUTE ONLY): 34 min   Charges:   PT Evaluation $PT Eval Low Complexity: 1 Procedure PT Treatments $Gait Training: 8-22 mins   PT G Codes:        Weston Anna, MPT Pager: [phone]

## 2015-07-23 NOTE — Care Management Note (Signed)
Case Management Note  Patient Details  Name: Christina Zhang MRN: [id number] Date of Birth: [birth year]  Subjective/Objective:                  R TKA Action/Plan: Discharge planning Expected Discharge Date:  07/24/15               Expected Discharge Plan:  [place]  In-House Referral:     Discharge planning Services  CM Consult  Post Acute Care Choice:  Home Health Choice offered to:  Patient  DME Arranged:  N/A DME Agency:  NA  HH Arranged:  PT HH Agency:  [facility]  Status of Service:  Completed, signed off  Medicare Important Message Given:    Date Medicare IM Given:    Medicare IM give by:    Date Additional Medicare IM Given:    Additional Medicare Important Message give by:     If discussed at [place] of Stay Meetings, dates discussed:    Additional Comments: Cm met with pt in room to offer choice of home health agency.  Pt chooses [facility] to render HHPT.  Referral given to [employer], Tim.  Pt states she has both a rolling walker and commode asst at home.  No other CM needs were communicated. Dellie Catholic, RN 07/23/2015, 12:30 PM

## 2015-07-23 NOTE — Progress Notes (Signed)
   Subjective: 1 Day Post-Op Procedure(s) (LRB): RIGHT TOTAL KNEE ARTHROPLASTY (Right) Patient reports pain as mild.   Patient seen in rounds with Dr. Wynelle Link.  Had some nausea last night.  Better this morning. Patient is well, but has had some minor complaints of pain in the knee, requiring pain medications We will start therapy today.  Plan is to go Home after hospital stay.  Objective: Vital signs in last 24 hours: Temp:  [97.3 F (36.3 C)-98.2 F (36.8 C)] 97.8 F (36.6 C) (03/14 0442) Pulse Rate:  [66-92] 68 (03/14 0629) Resp:  [12-18] 15 (03/14 0629) BP: (100-153)/(44-91) 111/54 mmHg (03/14 0442) SpO2:  [93 %-100 %] 95 % (03/14 0629) Weight:  [98.431 kg (217 lb)] 98.431 kg (217 lb) (03/13 1031)  Intake/Output from previous day:  Intake/Output Summary (Last 24 hours) at 07/23/15 0804 Last data filed at 07/23/15 0629  Gross per 24 hour  Intake 3673.75 ml  Output   1110 ml  Net 2563.75 ml    Intake/Output this shift: UOP 325  Labs:  Recent Labs  07/23/15 0358  HGB 11.1*    Recent Labs  07/23/15 0358  WBC 11.6*  RBC 3.67*  HCT 32.9*  PLT 357    Recent Labs  07/23/15 0358  NA 142  K 4.1  CL 107  CO2 26  BUN 14  CREATININE 0.68  GLUCOSE 140*  CALCIUM 8.8*   No results for input(s): LABPT, INR in the last 72 hours.  EXAM General - Patient is Alert, Appropriate and Oriented Extremity - Neurovascular intact Sensation intact distally Dorsiflexion/Plantar flexion intact Dressing - dressing C/D/I Motor Function - intact, moving foot and toes well on exam.  Hemovac pulled without difficulty.  Past Medical History  Diagnosis Date  . PONV (postoperative nausea and vomiting)   . Hypertension   . Depression   . Arthritis   . Diverticulosis     Assessment/Plan: 1 Day Post-Op Procedure(s) (LRB): RIGHT TOTAL KNEE ARTHROPLASTY (Right) Principal Problem:   OA (osteoarthritis) of knee  Estimated body mass index is 35.04 kg/(m^2) as calculated  from the following:   Height as of this encounter: 5\' 6"  (1.676 m).   Weight as of this encounter: 98.431 kg (217 lb). Advance diet Up with therapy Plan for discharge tomorrow Discharge home with home health  DVT Prophylaxis - Xarelto Weight-Bearing as tolerated to right leg D/C O2 and Pulse OX and try on Room Air  Arlee Muslim, PA-C Orthopaedic Surgery 07/23/2015, 8:04 AM

## 2015-07-23 NOTE — Evaluation (Signed)
Occupational Therapy Evaluation Patient Details Name: Christina Zhang MRN: [id number] DOB: 16-Feb-1946 Today's Date: 07/23/2015    History of Present Illness 70 yo female s/p R TKA 07/22/15   Clinical Impression   Pt was admitted for the above surgery.  All education was completed. No further OT is needed at this time    Follow Up Recommendations  Supervision/Assistance - 24 hour    Equipment Recommendations  None recommended by OT    Recommendations for Other Services       Precautions / Restrictions Precautions Precautions: Fall;Knee Required Braces or Orthoses: Knee Immobilizer - Right Knee Immobilizer - Right: Discontinue once straight leg raise with < 10 degree lag Restrictions Weight Bearing Restrictions: No RLE Weight Bearing: Weight bearing as tolerated      Mobility Bed Mobility Overal bed mobility: Needs Assistance           General bed mobility comments: oob  Transfers Overall transfer level: Needs assistance Equipment used: Rolling walker (2 wheeled) Transfers: Sit to/from Stand Sit to Stand: Min guard         General transfer comment: for safety; cues for UE/LE placement    Balance                                            ADL Overall ADL's : Needs assistance/impaired     Grooming: Wash/dry hands;Wash/dry face;Supervision/safety;Standing   Upper Body Bathing: Supervision/ safety;Standing   Lower Body Bathing: Minimal assistance;Sit to/from stand   Upper Body Dressing : Set up;Sitting       Toilet Transfer: Min guard;Ambulation;BSC;RW   Toileting- Water quality scientist and Hygiene: Min guard;Sit to/from stand         General ADL Comments: ambulated to bathroom, used commode and washed at sink.  Pt needs cues for hand placement for both sit to stand. She demonstrated good safety with sidestepping through tight spaces.  Verbalized shower sequence:  she did not feel she needed to practice this     Vision      Perception     Praxis      Pertinent Vitals/Pain Pain Assessment: 0-10 Pain Score: 5  Pain Location: R knee Pain Descriptors / Indicators: Aching Pain Intervention(s): Limited activity within patient's tolerance;Monitored during session;Premedicated before session;Repositioned;Ice applied     Hand Dominance     Extremity/Trunk Assessment Upper Extremity Assessment Upper Extremity Assessment: Overall WFL for tasks assessed      Cervical / Trunk Assessment Cervical / Trunk Assessment: Normal   Communication Communication Communication: No difficulties   Cognition Arousal/Alertness: Awake/alert Behavior During Therapy: WFL for tasks assessed/performed Overall Cognitive Status: Within Functional Limits for tasks assessed                     General Comments       Exercises       Shoulder Instructions      Home Living Family/patient expects to be discharged to:: Private residence Living Arrangements: Alone Available Help at Discharge: Family Type of Home: House             Bathroom Shower/Tub: Walk-in Corporate treasurer Toilet: Standard     Home Equipment: Bedside commode;Toilet riser;Walker - 2 wheels          Prior Functioning/Environment Level of Independence: Independent             OT Diagnosis:  Acute pain   OT Problem List:     OT Treatment/Interventions:      OT Goals(Current goals can be found in the care plan section) Acute Rehab OT Goals Patient Stated Goal: home soon OT Goal Formulation: All assessment and education complete, DC therapy  OT Frequency:     Barriers to D/C:            Co-evaluation              End of Session    Activity Tolerance: Patient tolerated treatment well Patient left: in chair;with call bell/phone within reach;with chair alarm set;with family/visitor present   Time: [id number] OT Time Calculation (min): 21 min Charges:  OT General Charges $OT Visit: 1 Procedure OT  Evaluation $OT Eval Low Complexity: 1 Procedure G-Codes:    Macauley Mossberg 30-Jul-2015, 2:20 PM Lesle Chris, OTR/L [phone] 07-30-2015

## 2015-07-23 NOTE — Discharge Instructions (Addendum)
° °Dr. Frank [name] °Total Joint Specialist °[facility] °[address] °[place], [place] [address] °[phone] ° °TOTAL KNEE REPLACEMENT POSTOPERATIVE DIRECTIONS ° °Knee Rehabilitation, Guidelines Following Surgery  °Results after knee surgery are often greatly improved when you follow the exercise, range of motion and muscle strengthening exercises prescribed by your doctor. Safety measures are also important to protect the knee from further injury. Any time any of these exercises cause you to have increased pain or swelling in your knee joint, decrease the amount until you are comfortable again and slowly increase them. If you have problems or questions, call your caregiver or physical therapist for advice.  ° °HOME CARE INSTRUCTIONS  °Remove items at home which could result in a fall. This includes throw rugs or furniture in walking pathways.  °· ICE to the affected knee every three hours for 30 minutes at a time and then as needed for pain and swelling.  Continue to use ice on the knee for pain and swelling from surgery. You may notice swelling that will progress down to the foot and ankle.  This is normal after surgery.  Elevate the leg when you are not up walking on it.   °· Continue to use the breathing machine which will help keep your temperature down.  It is common for your temperature to cycle up and down following surgery, especially at night when you are not up moving around and exerting yourself.  The breathing machine keeps your lungs expanded and your temperature down. °· Do not place pillow under knee, focus on keeping the knee straight while resting ° °DIET °You may resume your previous home diet once your are discharged from the hospital. ° °DRESSING / WOUND CARE / SHOWERING °You may shower 3 days after surgery, but keep the wounds dry during showering.  You may use an occlusive plastic wrap (Press'n Seal for example), NO SOAKING/SUBMERGING IN THE BATHTUB.  If the  bandage gets wet, change with a clean dry gauze.  If the incision gets wet, pat the wound dry with a clean towel. °You may start showering once you are discharged home but do not submerge the incision under water. Just pat the incision dry and apply a dry gauze dressing on daily. °Change the surgical dressing daily and reapply a dry dressing each time. ° °ACTIVITY °Walk with your walker as instructed. °Use walker as long as suggested by your caregivers. °Avoid periods of inactivity such as sitting longer than an hour when not asleep. This helps prevent blood clots.  °You may resume a sexual relationship in one month or when given the OK by your doctor.  °You may return to work once you are cleared by your doctor.  °Do not drive a car for 6 weeks or until released by you surgeon.  °Do not drive while taking narcotics. ° °WEIGHT BEARING °Weight bearing as tolerated with assist device (walker, cane, etc) as directed, use it as long as suggested by your surgeon or therapist, typically at least 4-6 weeks. ° °POSTOPERATIVE CONSTIPATION PROTOCOL °Constipation - defined medically as fewer than three stools per week and severe constipation as less than one stool per week. ° °One of the most common issues patients have following surgery is constipation.  Even if you have a regular bowel pattern at home, your normal regimen is likely to be disrupted due to multiple reasons following surgery.  Combination of anesthesia, postoperative narcotics, change in appetite and fluid intake all can affect your bowels.    In order to avoid complications following surgery, here are some recommendations in order to help you during your recovery period. ° °Colace (docusate) - Pick up an over-the-counter form of Colace or another stool softener and take twice a day as long as you are requiring postoperative pain medications.  Take with a full glass of water daily.  If you experience loose stools or diarrhea, hold the colace until you stool forms  back up.  If your symptoms do not get better within 1 week or if they get worse, check with your doctor. ° °Dulcolax (bisacodyl) - Pick up over-the-counter and take as directed by the product packaging as needed to assist with the movement of your bowels.  Take with a full glass of water.  Use this product as needed if not relieved by Colace only.  ° °MiraLax (polyethylene glycol) - Pick up over-the-counter to have on hand.  MiraLax is a solution that will increase the amount of water in your bowels to assist with bowel movements.  Take as directed and can mix with a glass of water, juice, soda, coffee, or tea.  Take if you go more than two days without a movement. °Do not use MiraLax more than once per day. Call your doctor if you are still constipated or irregular after using this medication for 7 days in a row. ° °If you continue to have problems with postoperative constipation, please contact the office for further assistance and recommendations.  If you experience "the worst abdominal pain ever" or develop nausea or vomiting, please contact the office immediatly for further recommendations for treatment. ° °ITCHING ° If you experience itching with your medications, try taking only a single pain pill, or even half a pain pill at a time.  You can also use Benadryl over the counter for itching or also to help with sleep.  ° °TED HOSE STOCKINGS °Wear the elastic stockings on both legs for three weeks following surgery during the day but you may remove then at night for sleeping. ° °MEDICATIONS °See your medication summary on the “After Visit Summary” that the nursing staff will review with you prior to discharge.  You may have some home medications which will be placed on hold until you complete the course of blood thinner medication.  It is important for you to complete the blood thinner medication as prescribed by your surgeon.  Continue your approved medications as instructed at time of  discharge. ° °PRECAUTIONS °If you experience chest pain or shortness of breath - call 911 immediately for transfer to the hospital emergency department.  °If you develop a fever greater that 101 F, purulent drainage from wound, increased redness or drainage from wound, foul odor from the wound/dressing, or calf pain - CONTACT YOUR SURGEON.   °                                                °FOLLOW-UP APPOINTMENTS °Make sure you keep all of your appointments after your operation with your surgeon and caregivers. You should call the office at the above phone number and make an appointment for approximately two weeks after the date of your surgery or on the date instructed by your surgeon outlined in the "After Visit Summary". ° ° °RANGE OF MOTION AND STRENGTHENING EXERCISES  °Rehabilitation of the knee is important following a knee injury or   an operation. After just a few days of immobilization, the muscles of the thigh which control the knee become weakened and shrink (atrophy). Knee exercises are designed to build up the tone and strength of the thigh muscles and to improve knee motion. Often times heat used for twenty to thirty minutes before working out will loosen up your tissues and help with improving the range of motion but do not use heat for the first two weeks following surgery. These exercises can be done on a training (exercise) mat, on the floor, on a table or on a bed. Use what ever works the best and is most comfortable for you Knee exercises include:  °Leg Lifts - While your knee is still immobilized in a splint or cast, you can do straight leg raises. Lift the leg to 60 degrees, hold for 3 sec, and slowly lower the leg. Repeat 10-20 times 2-3 times daily. Perform this exercise against resistance later as your knee gets better.  °Quad and Hamstring Sets - Tighten up the muscle on the front of the thigh (Quad) and hold for 5-10 sec. Repeat this 10-20 times hourly. Hamstring sets are done by pushing the  foot backward against an object and holding for 5-10 sec. Repeat as with quad sets.  °· Leg Slides: Lying on your back, slowly slide your foot toward your buttocks, bending your knee up off the floor (only go as far as is comfortable). Then slowly slide your foot back down until your leg is flat on the floor again. °· Angel Wings: Lying on your back spread your legs to the side as far apart as you can without causing discomfort.  °A rehabilitation program following serious knee injuries can speed recovery and prevent re-injury in the future due to weakened muscles. Contact your doctor or a physical therapist for more information on knee rehabilitation.  ° °IF YOU ARE TRANSFERRED TO A SKILLED REHAB FACILITY °If the patient is transferred to a skilled rehab facility following release from the hospital, a list of the current medications will be sent to the facility for the patient to continue.  When discharged from the skilled rehab facility, please have the facility set up the patient's Home Health Physical Therapy prior to being released. Also, the skilled facility will be responsible for providing the patient with their medications at time of release from the facility to include their pain medication, the muscle relaxants, and their blood thinner medication. If the patient is still at the rehab facility at time of the two week follow up appointment, the skilled rehab facility will also need to assist the patient in arranging follow up appointment in our office and any transportation needs. ° °MAKE SURE YOU:  °Understand these instructions.  °Get help right away if you are not doing well or get worse.  ° ° °Pick up stool softner and laxative for home use following surgery while on pain medications. °Do not submerge incision under water. °Please use good hand washing techniques while changing dressing each day. °May shower starting three days after surgery. °Please use a clean towel to pat the incision dry following  showers. °Continue to use ice for pain and swelling after surgery. °Do not use any lotions or creams on the incision until instructed by your surgeon. ° °Take Xarelto for two and a half more weeks, then discontinue Xarelto. °Once the patient has completed the Xarelto, they may resume the 81 mg Aspirin. ° ° °Information on my medicine - XARELTO® (Rivaroxaban) ° °  This medication education was reviewed with me or my healthcare representative as part of my discharge preparation.  The pharmacist that spoke with me during my hospital stay was:  Zeigler, Dustin George, RPH ° °Why was Xarelto® prescribed for you? °Xarelto® was prescribed for you to reduce the risk of blood clots forming after orthopedic surgery. The medical term for these abnormal blood clots is venous thromboembolism (VTE). ° °What do you need to know about xarelto® ? °Take your Xarelto® ONCE DAILY at the same time every day. °You may take it either with or without food. ° °If you have difficulty swallowing the tablet whole, you may crush it and mix in applesauce just prior to taking your dose. ° °Take Xarelto® exactly as prescribed by your doctor and DO NOT stop taking Xarelto® without talking to the doctor who prescribed the medication.  Stopping without other VTE prevention medication to take the place of Xarelto® may increase your risk of developing a clot. ° °After discharge, you should have regular check-up appointments with your healthcare provider that is prescribing your Xarelto®.   ° °What do you do if you miss a dose? °If you miss a dose, take it as soon as you remember on the same day then continue your regularly scheduled once daily regimen the next day. Do not take two doses of Xarelto® on the same day.  ° °Important Safety Information °A possible side effect of Xarelto® is bleeding. You should call your healthcare provider right away if you experience any of the following: °? Bleeding from an injury or your nose that does not  stop. °? Unusual colored urine (red or dark brown) or unusual colored stools (red or black). °? Unusual bruising for unknown reasons. °? A serious fall or if you hit your head (even if there is no bleeding). ° °Some medicines may interact with Xarelto® and might increase your risk of bleeding while on Xarelto®. To help avoid this, consult your healthcare provider or pharmacist prior to using any new prescription or non-prescription medications, including herbals, vitamins, non-steroidal anti-inflammatory drugs (NSAIDs) and supplements. ° °This website has more information on Xarelto®: www.xarelto.com. ° ° °

## 2015-07-23 NOTE — Progress Notes (Signed)
Physical Therapy Treatment Patient Details Name: MICKY RODDA MRN: [id number] DOB: [birth year] Today's Date: 07/23/2015    History of Present Illness 70 yo female s/p R TKA 07/22/15    PT Comments    Progressing with mobility.  Follow Up Recommendations  Home health PT     Equipment Recommendations  None recommended by PT    Recommendations for Other Services       Precautions / Restrictions Precautions Precautions: Fall;Knee Required Braces or Orthoses: Knee Immobilizer - Right Knee Immobilizer - Right: Discontinue once straight leg raise with < 10 degree lag Restrictions Weight Bearing Restrictions: No RLE Weight Bearing: Weight bearing as tolerated    Mobility  Bed Mobility Overal bed mobility: Needs Assistance Bed Mobility: Sit to Supine       Sit to supine: Min assist   General bed mobility comments: Assist for LE  Transfers Overall transfer level: Needs assistance Equipment used: Rolling walker (2 wheeled) Transfers: Sit to/from Stand Sit to Stand: Min guard         General transfer comment: close guard for safety.   Ambulation/Gait Ambulation/Gait assistance: Min guard Ambulation Distance (Feet): 75 Feet Assistive device: Rolling walker (2 wheeled) Gait Pattern/deviations: Step-to pattern;Trunk flexed;Antalgic     General Gait Details: close guard for safety   Stairs            Wheelchair Mobility    Modified Rankin (Stroke Patients Only)       Balance                                    Cognition Arousal/Alertness: Awake/alert Behavior During Therapy: WFL for tasks assessed/performed Overall Cognitive Status: Within Functional Limits for tasks assessed                      Exercises      General Comments        Pertinent Vitals/Pain Pain Assessment: 0-10 Pain Score: 7  Pain Location: R knee Pain Descriptors / Indicators: Sore;Aching Pain Intervention(s): Ice applied;Monitored during  session;Repositioned    Home Living Family/patient expects to be discharged to:: Private residence Living Arrangements: Alone Available Help at Discharge: Family Type of Home: House       Home Equipment: Bedside commode;Toilet riser;Walker - 2 wheels      Prior Function Level of Independence: Independent          PT Goals (current goals can now be found in the care plan section) Acute Rehab PT Goals Patient Stated Goal: home soon Progress towards PT goals: Progressing toward goals    Frequency  7X/week    PT Plan Current plan remains appropriate    Co-evaluation             End of Session   Activity Tolerance: Patient tolerated treatment well Patient left: in bed;with call bell/phone within reach;with bed alarm set     Time: 1359-1415 PT Time Calculation (min) (ACUTE ONLY): 16 min  Charges:  $Gait Training: 8-22 mins                    G Codes:      Weston Anna, MPT Pager: [phone]

## 2015-07-23 NOTE — Discharge Summary (Signed)
Physician Discharge Summary   Patient ID: Christina Zhang MRN: [id number] DOB/AGE: 68-Aug-1947 68 y.o.  Admit date: 07/22/2015 Discharge date: 07-24-2015  Primary Diagnosis:  Osteoarthritis Right knee(s)  Admission Diagnoses:  Past Medical History  Diagnosis Date  . PONV (postoperative nausea and vomiting)   . Hypertension   . Depression   . Arthritis   . Diverticulosis    Discharge Diagnoses:   Principal Problem:   OA (osteoarthritis) of knee  Estimated body mass index is 35.04 kg/(m^2) as calculated from the following:   Height as of this encounter: 5\' 6"  (1.676 m).   Weight as of this encounter: 98.431 kg (217 lb).  Procedure:  Procedure(s) (LRB): RIGHT TOTAL KNEE ARTHROPLASTY (Right)   Consults: None  HPI: Christina Zhang is a 70 y.o. year old female with end stage OA of her right knee with progressively worsening pain and dysfunction. She has constant pain, with activity and at rest and significant functional deficits with difficulties even with ADLs. She has had extensive non-op management including analgesics, injections of cortisone and viscosupplements, and home exercise program, but remains in significant pain with significant dysfunction.Radiographs show bone on bone arthritis medial and patellofemoral. She presents now for right Total Knee Arthroplasty.   Laboratory Data: Admission on 07/22/2015  Component Date Value Ref Range Status  . WBC 07/23/2015 11.6* 4.0 - 10.5 K/uL Final  . RBC 07/23/2015 3.67* 3.87 - 5.11 MIL/uL Final  . Hemoglobin 07/23/2015 11.1* 12.0 - 15.0 g/dL Final  . HCT 07/25/2015 32.9* 36.0 - 46.0 % Final  . MCV 07/23/2015 89.6  78.0 - 100.0 fL Final  . MCH 07/23/2015 30.2  26.0 - 34.0 pg Final  . MCHC 07/23/2015 33.7  30.0 - 36.0 g/dL Final  . RDW 07/25/2015 12.4  11.5 - 15.5 % Final  . Platelets 07/23/2015 357  150 - 400 K/uL Final  . Sodium 07/23/2015 142  135 - 145 mmol/L Final  . Potassium 07/23/2015 4.1  3.5 - 5.1 mmol/L Final  .  Chloride 07/23/2015 107  101 - 111 mmol/L Final  . CO2 07/23/2015 26  22 - 32 mmol/L Final  . Glucose, Bld 07/23/2015 140* 65 - 99 mg/dL Final  . BUN 07/25/2015 14  6 - 20 mg/dL Final  . Creatinine, Ser 07/23/2015 0.68  0.44 - 1.00 mg/dL Final  . Calcium 07/25/2015 8.8* 8.9 - 10.3 mg/dL Final  . GFR calc non Af Amer 07/23/2015 >60  >60 mL/min Final  . GFR calc Af Amer 07/23/2015 >60  >60 mL/min Final   Comment: (NOTE) The eGFR has been calculated using the CKD EPI equation. This calculation has not been validated in all clinical situations. eGFR's persistently <60 mL/min signify possible Chronic Kidney Disease.   . Anion gap 07/23/2015 9  5 - 15 Final  . WBC 07/24/2015 11.3* 4.0 - 10.5 K/uL Final  . RBC 07/24/2015 3.52* 3.87 - 5.11 MIL/uL Final  . Hemoglobin 07/24/2015 10.6* 12.0 - 15.0 g/dL Final  . HCT 07/26/2015 31.6* 36.0 - 46.0 % Final  . MCV 07/24/2015 89.8  78.0 - 100.0 fL Final  . MCH 07/24/2015 30.1  26.0 - 34.0 pg Final  . MCHC 07/24/2015 33.5  30.0 - 36.0 g/dL Final  . RDW 07/26/2015 12.8  11.5 - 15.5 % Final  . Platelets 07/24/2015 332  150 - 400 K/uL Final  . Sodium 07/24/2015 141  135 - 145 mmol/L Final  . Potassium 07/24/2015 3.3* 3.5 - 5.1 mmol/L Final   Comment: DELTA  CHECK NOTED REPEATED TO VERIFY   . Chloride 07/24/2015 104  101 - 111 mmol/L Final  . CO2 07/24/2015 28  22 - 32 mmol/L Final  . Glucose, Bld 07/24/2015 117* 65 - 99 mg/dL Final  . BUN 07/24/2015 13  6 - 20 mg/dL Final  . Creatinine, Ser 07/24/2015 0.63  0.44 - 1.00 mg/dL Final  . Calcium 07/24/2015 8.7* 8.9 - 10.3 mg/dL Final  . GFR calc non Af Amer 07/24/2015 >60  >60 mL/min Final  . GFR calc Af Amer 07/24/2015 >60  >60 mL/min Final   Comment: (NOTE) The eGFR has been calculated using the CKD EPI equation. This calculation has not been validated in all clinical situations. eGFR's persistently <60 mL/min signify possible Chronic Kidney Disease.   Georgiann Hahn gap 07/24/2015 9  5 - 15 Final    Hospital Outpatient Visit on 07/12/2015  Component Date Value Ref Range Status  . aPTT 07/12/2015 30  24 - 37 seconds Final  . WBC 07/12/2015 7.7  4.0 - 10.5 K/uL Final  . RBC 07/12/2015 4.67  3.87 - 5.11 MIL/uL Final  . Hemoglobin 07/12/2015 14.2  12.0 - 15.0 g/dL Final  . HCT 07/12/2015 43.1  36.0 - 46.0 % Final  . MCV 07/12/2015 92.3  78.0 - 100.0 fL Final  . MCH 07/12/2015 30.4  26.0 - 34.0 pg Final  . MCHC 07/12/2015 32.9  30.0 - 36.0 g/dL Final  . RDW 07/12/2015 12.6  11.5 - 15.5 % Final  . Platelets 07/12/2015 428* 150 - 400 K/uL Final  . Sodium 07/12/2015 139  135 - 145 mmol/L Final  . Potassium 07/12/2015 3.7  3.5 - 5.1 mmol/L Final  . Chloride 07/12/2015 102  101 - 111 mmol/L Final  . CO2 07/12/2015 26  22 - 32 mmol/L Final  . Glucose, Bld 07/12/2015 104* 65 - 99 mg/dL Final  . BUN 07/12/2015 13  6 - 20 mg/dL Final  . Creatinine, Ser 07/12/2015 0.70  0.44 - 1.00 mg/dL Final  . Calcium 07/12/2015 10.0  8.9 - 10.3 mg/dL Final  . Total Protein 07/12/2015 8.2* 6.5 - 8.1 g/dL Final  . Albumin 07/12/2015 4.5  3.5 - 5.0 g/dL Final  . AST 07/12/2015 26  15 - 41 U/L Final  . ALT 07/12/2015 19  14 - 54 U/L Final  . Alkaline Phosphatase 07/12/2015 104  38 - 126 U/L Final  . Total Bilirubin 07/12/2015 0.6  0.3 - 1.2 mg/dL Final  . GFR calc non Af Amer 07/12/2015 >60  >60 mL/min Final  . GFR calc Af Amer 07/12/2015 >60  >60 mL/min Final   Comment: (NOTE) The eGFR has been calculated using the CKD EPI equation. This calculation has not been validated in all clinical situations. eGFR's persistently <60 mL/min signify possible Chronic Kidney Disease.   . Anion gap 07/12/2015 11  5 - 15 Final  . Prothrombin Time 07/12/2015 13.6  11.6 - 15.2 seconds Final  . INR 07/12/2015 1.02  0.00 - 1.49 Final  . ABO/RH(D) 07/12/2015 A POS   Final  . Antibody Screen 07/12/2015 NEG   Final  . Sample Expiration 07/12/2015 07/25/2015   Final  . Extend sample reason 07/12/2015 NO TRANSFUSIONS OR  PREGNANCY IN THE PAST 3 MONTHS   Final  . Color, Urine 07/12/2015 YELLOW  YELLOW Final  . APPearance 07/12/2015 CLEAR  CLEAR Final  . Specific Gravity, Urine 07/12/2015 1.016  1.005 - 1.030 Final  . pH 07/12/2015 5.5  5.0 - 8.0 Final  .  Glucose, UA 07/12/2015 NEGATIVE  NEGATIVE mg/dL Final  . Hgb urine dipstick 07/12/2015 NEGATIVE  NEGATIVE Final  . Bilirubin Urine 07/12/2015 NEGATIVE  NEGATIVE Final  . Ketones, ur 07/12/2015 NEGATIVE  NEGATIVE mg/dL Final  . Protein, ur 07/12/2015 NEGATIVE  NEGATIVE mg/dL Final  . Nitrite 07/12/2015 NEGATIVE  NEGATIVE Final  . Leukocytes, UA 07/12/2015 NEGATIVE  NEGATIVE Final   MICROSCOPIC NOT DONE ON URINES WITH NEGATIVE PROTEIN, BLOOD, LEUKOCYTES, NITRITE, OR GLUCOSE <1000 mg/dL.  Marland Kitchen MRSA, PCR 07/12/2015 NEGATIVE  NEGATIVE Final  . Staphylococcus aureus 07/12/2015 NEGATIVE  NEGATIVE Final   Comment:        The Xpert SA Assay (FDA approved for NASAL specimens in patients over 29 years of age), is one component of a comprehensive surveillance program.  Test performance has been validated by [facility] for patients greater than or equal to 78 year old. It is not intended to diagnose infection nor to guide or monitor treatment.   . ABO/RH(D) 07/12/2015 A POS   Final     X-Rays:No results found.  EKG: Orders placed or performed in visit on 07/12/15  . EKG 12-Lead     Hospital Course: Christina Zhang is a 70 y.o. who was admitted to [facility]. They were brought to the operating room on 07/22/2015 and underwent Procedure(s): RIGHT TOTAL KNEE ARTHROPLASTY.  Patient tolerated the procedure well and was later transferred to the recovery room and then to the orthopaedic floor for postoperative care.  They were given PO and IV analgesics for pain control following their surgery.  They were given 24 hours of postoperative antibiotics of  Anti-infectives    Start     Dose/Rate Route Frequency Ordered Stop   07/22/15 1930  ceFAZolin (ANCEF)  IVPB 2 g/50 mL premix     2 g 100 mL/hr over 30 Minutes Intravenous Every 6 hours 07/22/15 1611 07/23/15 0052   07/22/15 1028  ceFAZolin (ANCEF) IVPB 2 g/50 mL premix     2 g 100 mL/hr over 30 Minutes Intravenous On call to O.R. 07/22/15 1029 07/22/15 1317     and started on DVT prophylaxis in the form of Xarelto.   PT and OT were ordered for total joint protocol.  Discharge planning consulted to help with postop disposition and equipment needs.  Patient had a decnt night on the evening of surgery.  They started to get up OOB with therapy on day one. Hemovac drain was pulled without difficulty.  Continued to work with therapy into day two.  Dressing was changed on day two and the incision was healing well. Patient was seen in rounds on day two by Dr. Wynelle Link and was ready to go home.  Discharge home with home health Diet - Cardiac diet Follow up - in 2 weeks Activity - WBAT Disposition - Home Condition Upon Discharge - Good D/C Meds - See DC Summary DVT Prophylaxis - Xarelto      Discharge Instructions    Call MD / Call 911    Complete by:  As directed   If you experience chest pain or shortness of breath, CALL 911 and be transported to the hospital emergency room.  If you develope a fever above 101 F, pus (white drainage) or increased drainage or redness at the wound, or calf pain, call your surgeon's office.     Change dressing    Complete by:  As directed   Change dressing daily with sterile 4 x 4 inch gauze dressing and apply  TED hose. Do not submerge the incision under water.     Constipation Prevention    Complete by:  As directed   Drink plenty of fluids.  Prune juice may be helpful.  You may use a stool softener, such as Colace (over the counter) 100 mg twice a day.  Use MiraLax (over the counter) for constipation as needed.     Diet - low sodium heart healthy    Complete by:  As directed      Discharge instructions    Complete by:  As directed   Pick up stool softner and  laxative for home use following surgery while on pain medications. Do not submerge incision under water. Please use good hand washing techniques while changing dressing each day. May shower starting three days after surgery. Please use a clean towel to pat the incision dry following showers. Continue to use ice for pain and swelling after surgery. Do not use any lotions or creams on the incision until instructed by your surgeon.  Take Xarelto for two and a half more weeks, then discontinue Xarelto. Once the patient has completed the Xarelto, they may resume the 81 mg Aspirin.  Postoperative Constipation Protocol  Constipation - defined medically as fewer than three stools per week and severe constipation as less than one stool per week.  One of the most common issues patients have following surgery is constipation.  Even if you have a regular bowel pattern at home, your normal regimen is likely to be disrupted due to multiple reasons following surgery.  Combination of anesthesia, postoperative narcotics, change in appetite and fluid intake all can affect your bowels.  In order to avoid complications following surgery, here are some recommendations in order to help you during your recovery period.  Colace (docusate) - Pick up an over-the-counter form of Colace or another stool softener and take twice a day as long as you are requiring postoperative pain medications.  Take with a full glass of water daily.  If you experience loose stools or diarrhea, hold the colace until you stool forms back up.  If your symptoms do not get better within 1 week or if they get worse, check with your doctor.  Dulcolax (bisacodyl) - Pick up over-the-counter and take as directed by the product packaging as needed to assist with the movement of your bowels.  Take with a full glass of water.  Use this product as needed if not relieved by Colace only.   MiraLax (polyethylene glycol) - Pick up over-the-counter to have on  hand.  MiraLax is a solution that will increase the amount of water in your bowels to assist with bowel movements.  Take as directed and can mix with a glass of water, juice, soda, coffee, or tea.  Take if you go more than two days without a movement. Do not use MiraLax more than once per day. Call your doctor if you are still constipated or irregular after using this medication for 7 days in a row.  If you continue to have problems with postoperative constipation, please contact the office for further assistance and recommendations.  If you experience "the worst abdominal pain ever" or develop nausea or vomiting, please contact the office immediatly for further recommendations for treatment.     Do not put a pillow under the knee. Place it under the heel.    Complete by:  As directed      Do not sit on low chairs, stoools or toilet seats, as  it may be difficult to get up from low surfaces    Complete by:  As directed      Driving restrictions    Complete by:  As directed   No driving until released by the physician.     Increase activity slowly as tolerated    Complete by:  As directed      Lifting restrictions    Complete by:  As directed   No lifting until released by the physician.     Patient may shower    Complete by:  As directed   You may shower without a dressing once there is no drainage.  Do not wash over the wound.  If drainage remains, do not shower until drainage stops.     TED hose    Complete by:  As directed   Use stockings (TED hose) for 3 weeks on both leg(s).  You may remove them at night for sleeping.     Weight bearing as tolerated    Complete by:  As directed   Laterality:  right            Medication List    STOP taking these medications        aspirin EC 81 MG tablet     cholecalciferol 1000 units tablet  Commonly known as:  VITAMIN D      TAKE these medications        hydrochlorothiazide 25 MG tablet  Commonly known as:  HYDRODIURIL  Take 12.5 mg  by mouth every morning.     HYDROcodone-acetaminophen 5-325 MG tablet  Commonly known as:  NORCO/VICODIN  Take 1 tablet by mouth every 4 (four) hours as needed for moderate pain.     methocarbamol 500 MG tablet  Commonly known as:  ROBAXIN  Take 1 tablet (500 mg total) by mouth every 6 (six) hours as needed for muscle spasms.     ondansetron 4 MG tablet  Commonly known as:  ZOFRAN  Take 1 tablet (4 mg total) by mouth every 6 (six) hours as needed for nausea.     rivaroxaban 10 MG Tabs tablet  Commonly known as:  XARELTO  Take 1 tablet (10 mg total) by mouth daily with breakfast. Take Xarelto for two and a half more weeks, then discontinue Xarelto. Once the patient has completed the Xarelto, they may resume the 81 mg Aspirin.     sertraline 100 MG tablet  Commonly known as:  ZOLOFT  Take 100 mg by mouth daily.       Follow-up Information    Follow up with [facility].   Why:  home health physical therapy   Contact information:   [address] [place] [address] [phone]       Follow up with Gearlean Alf, MD On 08/06/2015.   Specialty:  Orthopedic Surgery   Why:  Call office at [phone] to setup appointment on Tuesday 08/06/15 with Dr. Denman George information:   [address] [place] [address] [phone]       Signed: Arlee Muslim, PA-C Orthopaedic Surgery 07/24/2015, 10:34 AM

## 2015-07-24 LAB — BASIC METABOLIC PANEL
ANION GAP: 9 (ref 5–15)
BUN: 13 mg/dL (ref 6–20)
CALCIUM: 8.7 mg/dL — AB (ref 8.9–10.3)
CO2: 28 mmol/L (ref 22–32)
CREATININE: 0.63 mg/dL (ref 0.44–1.00)
Chloride: 104 mmol/L (ref 101–111)
GFR calc non Af Amer: >60 mL/min (ref >60)
Glucose, Bld: 117 mg/dL — ABNORMAL HIGH (ref 65–99)
Potassium: 3.3 mmol/L — ABNORMAL LOW (ref 3.5–5.1)
Sodium: 141 mmol/L (ref 135–145)

## 2015-07-24 LAB — CBC
HCT: 31.6 % — ABNORMAL LOW (ref 36.0–46.0)
Hemoglobin: 10.6 g/dL — ABNORMAL LOW (ref 12.0–15.0)
MCH: 30.1 pg (ref 26.0–34.0)
MCHC: 33.5 g/dL (ref 30.0–36.0)
MCV: 89.8 fL (ref 78.0–100.0)
PLATELETS: 332 10*3/uL (ref 150–400)
RBC: 3.52 MIL/uL — ABNORMAL LOW (ref 3.87–5.11)
RDW: 12.8 % (ref 11.5–15.5)
WBC: 11.3 10*3/uL — AB (ref 4.0–10.5)

## 2015-07-24 MED ORDER — ONDANSETRON HCL 4 MG PO TABS: 4.0000 mg | ORAL_TABLET | Freq: Four times a day (QID) | ORAL | Status: DC | PRN

## 2015-07-24 MED ORDER — POTASSIUM CHLORIDE CRYS ER 20 MEQ PO TBCR
40.0000 meq | EXTENDED_RELEASE_TABLET | ORAL | Status: AC
  Administered 2015-07-24 (×2): 40 meq via ORAL
  Filled 2015-07-24: qty 2

## 2015-07-24 MED ORDER — HYDROCODONE-ACETAMINOPHEN 5-325 MG PO TABS
1.0000 | ORAL_TABLET | ORAL | Status: DC | PRN
  Administered 2015-07-24 (×2): 1 via ORAL
  Filled 2015-07-24 (×2): qty 1

## 2015-07-24 MED ORDER — RIVAROXABAN 10 MG PO TABS: 10.0000 mg | ORAL_TABLET | Freq: Every day | ORAL | Status: DC

## 2015-07-24 MED ORDER — METHOCARBAMOL 500 MG PO TABS: 500.0000 mg | ORAL_TABLET | Freq: Four times a day (QID) | ORAL | Status: DC | PRN

## 2015-07-24 MED ORDER — HYDROCODONE-ACETAMINOPHEN 5-325 MG PO TABS: 1.0000 | ORAL_TABLET | ORAL | Status: DC | PRN

## 2015-07-24 NOTE — Progress Notes (Signed)
Physical Therapy Treatment Patient Details Name: Christina Zhang MRN: [id number] DOB: Mar 17, 1946 Today's Date: 07/24/2015    History of Present Illness 70 yo female s/p R TKA 07/22/15    PT Comments    Progressing well with mobility. Pt states she has decided to use the back entrance into her home where there are not steps. All education completed.   Follow Up Recommendations  Home health PT     Equipment Recommendations  None recommended by PT    Recommendations for Other Services       Precautions / Restrictions Precautions Precautions: Fall;Knee Required Braces or Orthoses: Knee Immobilizer - Right Knee Immobilizer - Right: Discontinue once straight leg raise with < 10 degree lag Restrictions Weight Bearing Restrictions: No RLE Weight Bearing: Weight bearing as tolerated    Mobility  Bed Mobility               General bed mobility comments: oob in recliner  Transfers Overall transfer level: Needs assistance Equipment used: Rolling walker (2 wheeled) Transfers: Sit to/from Stand Sit to Stand: Min guard         General transfer comment: close guard for safety.   Ambulation/Gait Ambulation/Gait assistance: Min guard Ambulation Distance (Feet): 60 Feet Assistive device: Rolling walker (2 wheeled) Gait Pattern/deviations: Step-to pattern;Trunk flexed;Antalgic     General Gait Details: close guard for safety   Stairs            Wheelchair Mobility    Modified Rankin (Stroke Patients Only)       Balance                                    Cognition Arousal/Alertness: Awake/alert Behavior During Therapy: WFL for tasks assessed/performed Overall Cognitive Status: Within Functional Limits for tasks assessed                      Exercises      General Comments        Pertinent Vitals/Pain Pain Assessment: 0-10 Pain Score: 7  Pain Location: R knee with activity Pain Descriptors / Indicators: Sore;Aching Pain  Intervention(s): Monitored during session;Repositioned;Ice applied    Home Living                      Prior Function            PT Goals (current goals can now be found in the care plan section) Progress towards PT goals: Progressing toward goals    Frequency  7X/week    PT Plan Current plan remains appropriate    Co-evaluation             End of Session   Activity Tolerance: Patient tolerated treatment well Patient left: in chair;with call bell/phone within reach;with family/visitor present     Time: [id number] PT Time Calculation (min) (ACUTE ONLY): 20 min  Charges:  $Gait Training: 8-22 mins                    G Codes:      Weston Anna, MPT Pager: [phone]

## 2015-07-24 NOTE — Progress Notes (Signed)
Physical Therapy Treatment Patient Details Name: Christina Zhang MRN: [id number] DOB: 1946/05/04 Today's Date: 07/24/2015    History of Present Illness 70 yo female s/p R TKA 07/22/15    PT Comments    Progressing with mobility. Pt was able to perform exercises, gait training, and stair training during 1st session. Per RN, pt is to have a 2nd session so will plan to see again later today.   Follow Up Recommendations  Home health PT     Equipment Recommendations  None recommended by PT    Recommendations for Other Services       Precautions / Restrictions Precautions Precautions: Fall;Knee Required Braces or Orthoses: Knee Immobilizer - Right Knee Immobilizer - Right: Discontinue once straight leg raise with < 10 degree lag Restrictions Weight Bearing Restrictions: No RLE Weight Bearing: Weight bearing as tolerated    Mobility  Bed Mobility Overal bed mobility: Needs Assistance Bed Mobility: Supine to Sit     Supine to sit: Min assist     General bed mobility comments: Assist for LE  Transfers Overall transfer level: Needs assistance Equipment used: Rolling walker (2 wheeled) Transfers: Sit to/from Stand Sit to Stand: Min guard         General transfer comment: close guard for safety.   Ambulation/Gait Ambulation/Gait assistance: Min guard Ambulation Distance (Feet): 75 Feet Assistive device: Rolling walker (2 wheeled) Gait Pattern/deviations: Step-to pattern;Trunk flexed;Antalgic     General Gait Details: close guard for safety   Stairs            Wheelchair Mobility    Modified Rankin (Stroke Patients Only)       Balance                                    Cognition Arousal/Alertness: Awake/alert Behavior During Therapy: WFL for tasks assessed/performed Overall Cognitive Status: Within Functional Limits for tasks assessed                      Exercises Total Joint Exercises Ankle Circles/Pumps: AROM;Both;10  reps;Supine Quad Sets: AROM;Both;10 reps;Supine Heel Slides: AAROM;Right;10 reps;Supine Hip ABduction/ADduction: AAROM;Right;10 reps;Supine Straight Leg Raises: AAROM;Right;10 reps;Supine Goniometric ROM: ~10-70 degrees    General Comments        Pertinent Vitals/Pain Pain Assessment: 0-10 Pain Score: 7  Pain Location: R knee with activity Pain Descriptors / Indicators: Sore;Aching Pain Intervention(s): Monitored during session;Ice applied;Repositioned    Home Living                      Prior Function            PT Goals (current goals can now be found in the care plan section) Progress towards PT goals: Progressing toward goals    Frequency  7X/week    PT Plan Current plan remains appropriate    Co-evaluation             End of Session Equipment Utilized During Treatment: Gait belt Activity Tolerance: Patient tolerated treatment well Patient left: in chair;with call bell/phone within reach;with chair alarm set     Time: [phone] PT Time Calculation (min) (ACUTE ONLY): 20 min  Charges:  $Gait Training: 8-22 mins                    G Codes:      Weston Anna, MPT Pager: [phone]

## 2015-07-24 NOTE — Progress Notes (Signed)
Pt to d/c home with [facility] home health. No DME needs. AVS reviewed and "My Chart" discussed with pt. Pt capable of verbalizing medications, dressing changes, signs and symptoms of infection, and follow-up appointments. Remains hemodynamically stable. No signs and symptoms of distress. Educated pt to return to ER in the case of SOB, dizziness, or chest pain.  

## 2015-07-25 NOTE — Progress Notes (Signed)
LATE ENTRY NOTE Date of Service of Visit - 07/24/2015    Subjective: 2 Days Post-Op Procedure(s) (LRB): RIGHT TOTAL KNEE ARTHROPLASTY (Right) Patient reports pain as mild.   Patient seen in rounds with Dr. Wynelle Link. Patient is well, but has had some minor complaints of pain in the knee, requiring pain medications Patient is ready to go home  Objective: Intake/Output from previous day: No intake or output data in the 24 hours ending 07/25/15 1403  Intake/Output this shift: Total I/O In: 1330 [P.O.:1330] Out: 950 [Urine:950]  Labs:  Recent Labs  07/23/15 0358 07/24/15 0419  HGB 11.1* 10.6*    Recent Labs  07/23/15 0358 07/24/15 0419  WBC 11.6* 11.3*  RBC 3.67* 3.52*  HCT 32.9* 31.6*  PLT 357 332    Recent Labs  07/23/15 0358 07/24/15 0419  NA 142 141  K 4.1 3.3*  CL 107 104  CO2 26 28  BUN 14 13  CREATININE 0.68 0.63  GLUCOSE 140* 117*  CALCIUM 8.8* 8.7*   No results for input(s): LABPT, INR in the last 72 hours.  EXAM: General - Patient is Alert and Appropriate Extremity - Neurovascular intact Sensation intact distally Incision - clean, dry Motor Function - intact, moving foot and toes well on exam.   Assessment/Plan: 3 Days Post-Op Procedure(s) (LRB): RIGHT TOTAL KNEE ARTHROPLASTY (Right) Procedure(s) (LRB): RIGHT TOTAL KNEE ARTHROPLASTY (Right) Past Medical History  Diagnosis Date  . PONV (postoperative nausea and vomiting)   . Hypertension   . Depression   . Arthritis   . Diverticulosis    Principal Problem:   OA (osteoarthritis) of knee  Estimated body mass index is 35.04 kg/(m^2) as calculated from the following:   Height as of this encounter: 5\' 6"  (1.676 m).   Weight as of this encounter: 98.431 kg (217 lb). Up with therapy Discharge home with home health Diet - Cardiac diet Follow up - in 2 weeks Activity - WBAT Disposition - Home Condition Upon Discharge - Good D/C Meds - See DC Summary DVT Prophylaxis - Hendricks, PA-C Orthopaedic Surgery 07/25/2015, 2:03 PM

## 2015-07-26 DIAGNOSIS — Z96653 Presence of artificial knee joint, bilateral: Secondary | ICD-10-CM | POA: Diagnosis not present

## 2015-07-26 DIAGNOSIS — Z6834 Body mass index (BMI) 34.0-34.9, adult: Secondary | ICD-10-CM | POA: Diagnosis not present

## 2015-07-26 DIAGNOSIS — F329 Major depressive disorder, single episode, unspecified: Secondary | ICD-10-CM | POA: Diagnosis not present

## 2015-07-26 DIAGNOSIS — I10 Essential (primary) hypertension: Secondary | ICD-10-CM | POA: Diagnosis not present

## 2015-07-26 DIAGNOSIS — Z471 Aftercare following joint replacement surgery: Secondary | ICD-10-CM | POA: Diagnosis not present

## 2015-07-26 DIAGNOSIS — M1990 Unspecified osteoarthritis, unspecified site: Secondary | ICD-10-CM | POA: Diagnosis not present

## 2015-07-29 DIAGNOSIS — I10 Essential (primary) hypertension: Secondary | ICD-10-CM | POA: Diagnosis not present

## 2015-07-29 DIAGNOSIS — M1990 Unspecified osteoarthritis, unspecified site: Secondary | ICD-10-CM | POA: Diagnosis not present

## 2015-07-29 DIAGNOSIS — F329 Major depressive disorder, single episode, unspecified: Secondary | ICD-10-CM | POA: Diagnosis not present

## 2015-07-29 DIAGNOSIS — Z6834 Body mass index (BMI) 34.0-34.9, adult: Secondary | ICD-10-CM | POA: Diagnosis not present

## 2015-07-29 DIAGNOSIS — Z96653 Presence of artificial knee joint, bilateral: Secondary | ICD-10-CM | POA: Diagnosis not present

## 2015-07-29 DIAGNOSIS — Z471 Aftercare following joint replacement surgery: Secondary | ICD-10-CM | POA: Diagnosis not present

## 2015-07-30 DIAGNOSIS — Z471 Aftercare following joint replacement surgery: Secondary | ICD-10-CM | POA: Diagnosis not present

## 2015-07-30 DIAGNOSIS — Z6834 Body mass index (BMI) 34.0-34.9, adult: Secondary | ICD-10-CM | POA: Diagnosis not present

## 2015-07-30 DIAGNOSIS — M1990 Unspecified osteoarthritis, unspecified site: Secondary | ICD-10-CM | POA: Diagnosis not present

## 2015-07-30 DIAGNOSIS — F329 Major depressive disorder, single episode, unspecified: Secondary | ICD-10-CM | POA: Diagnosis not present

## 2015-07-30 DIAGNOSIS — Z96653 Presence of artificial knee joint, bilateral: Secondary | ICD-10-CM | POA: Diagnosis not present

## 2015-07-30 DIAGNOSIS — I10 Essential (primary) hypertension: Secondary | ICD-10-CM | POA: Diagnosis not present

## 2015-07-31 DIAGNOSIS — Z6834 Body mass index (BMI) 34.0-34.9, adult: Secondary | ICD-10-CM | POA: Diagnosis not present

## 2015-07-31 DIAGNOSIS — I10 Essential (primary) hypertension: Secondary | ICD-10-CM | POA: Diagnosis not present

## 2015-07-31 DIAGNOSIS — F329 Major depressive disorder, single episode, unspecified: Secondary | ICD-10-CM | POA: Diagnosis not present

## 2015-07-31 DIAGNOSIS — M1990 Unspecified osteoarthritis, unspecified site: Secondary | ICD-10-CM | POA: Diagnosis not present

## 2015-07-31 DIAGNOSIS — Z471 Aftercare following joint replacement surgery: Secondary | ICD-10-CM | POA: Diagnosis not present

## 2015-07-31 DIAGNOSIS — Z96653 Presence of artificial knee joint, bilateral: Secondary | ICD-10-CM | POA: Diagnosis not present

## 2015-08-01 DIAGNOSIS — Z96653 Presence of artificial knee joint, bilateral: Secondary | ICD-10-CM | POA: Diagnosis not present

## 2015-08-01 DIAGNOSIS — Z471 Aftercare following joint replacement surgery: Secondary | ICD-10-CM | POA: Diagnosis not present

## 2015-08-01 DIAGNOSIS — F329 Major depressive disorder, single episode, unspecified: Secondary | ICD-10-CM | POA: Diagnosis not present

## 2015-08-01 DIAGNOSIS — I10 Essential (primary) hypertension: Secondary | ICD-10-CM | POA: Diagnosis not present

## 2015-08-01 DIAGNOSIS — M1990 Unspecified osteoarthritis, unspecified site: Secondary | ICD-10-CM | POA: Diagnosis not present

## 2015-08-01 DIAGNOSIS — Z6834 Body mass index (BMI) 34.0-34.9, adult: Secondary | ICD-10-CM | POA: Diagnosis not present

## 2015-08-06 DIAGNOSIS — Z96651 Presence of right artificial knee joint: Secondary | ICD-10-CM | POA: Diagnosis not present

## 2015-08-06 DIAGNOSIS — Z471 Aftercare following joint replacement surgery: Secondary | ICD-10-CM | POA: Diagnosis not present

## 2015-08-13 DIAGNOSIS — Z471 Aftercare following joint replacement surgery: Secondary | ICD-10-CM | POA: Diagnosis not present

## 2015-08-13 DIAGNOSIS — R2689 Other abnormalities of gait and mobility: Secondary | ICD-10-CM | POA: Diagnosis not present

## 2015-08-13 DIAGNOSIS — Z96651 Presence of right artificial knee joint: Secondary | ICD-10-CM | POA: Diagnosis not present

## 2015-08-15 DIAGNOSIS — Z96651 Presence of right artificial knee joint: Secondary | ICD-10-CM | POA: Diagnosis not present

## 2015-08-15 DIAGNOSIS — R2689 Other abnormalities of gait and mobility: Secondary | ICD-10-CM | POA: Diagnosis not present

## 2015-08-15 DIAGNOSIS — Z471 Aftercare following joint replacement surgery: Secondary | ICD-10-CM | POA: Diagnosis not present

## 2015-08-16 DIAGNOSIS — Z471 Aftercare following joint replacement surgery: Secondary | ICD-10-CM | POA: Diagnosis not present

## 2015-08-16 DIAGNOSIS — R2689 Other abnormalities of gait and mobility: Secondary | ICD-10-CM | POA: Diagnosis not present

## 2015-08-16 DIAGNOSIS — Z96651 Presence of right artificial knee joint: Secondary | ICD-10-CM | POA: Diagnosis not present

## 2015-08-19 DIAGNOSIS — Z96651 Presence of right artificial knee joint: Secondary | ICD-10-CM | POA: Diagnosis not present

## 2015-08-19 DIAGNOSIS — Z471 Aftercare following joint replacement surgery: Secondary | ICD-10-CM | POA: Diagnosis not present

## 2015-08-19 DIAGNOSIS — R2689 Other abnormalities of gait and mobility: Secondary | ICD-10-CM | POA: Diagnosis not present

## 2015-08-21 DIAGNOSIS — Z471 Aftercare following joint replacement surgery: Secondary | ICD-10-CM | POA: Diagnosis not present

## 2015-08-21 DIAGNOSIS — Z96651 Presence of right artificial knee joint: Secondary | ICD-10-CM | POA: Diagnosis not present

## 2015-08-21 DIAGNOSIS — R2689 Other abnormalities of gait and mobility: Secondary | ICD-10-CM | POA: Diagnosis not present

## 2015-08-22 DIAGNOSIS — R2689 Other abnormalities of gait and mobility: Secondary | ICD-10-CM | POA: Diagnosis not present

## 2015-08-22 DIAGNOSIS — M1990 Unspecified osteoarthritis, unspecified site: Secondary | ICD-10-CM | POA: Diagnosis not present

## 2015-08-22 DIAGNOSIS — Z471 Aftercare following joint replacement surgery: Secondary | ICD-10-CM | POA: Diagnosis not present

## 2015-08-22 DIAGNOSIS — M1711 Unilateral primary osteoarthritis, right knee: Secondary | ICD-10-CM | POA: Diagnosis not present

## 2015-08-22 DIAGNOSIS — Z96651 Presence of right artificial knee joint: Secondary | ICD-10-CM | POA: Diagnosis not present

## 2015-08-22 DIAGNOSIS — I10 Essential (primary) hypertension: Secondary | ICD-10-CM | POA: Diagnosis not present

## 2015-08-22 DIAGNOSIS — E785 Hyperlipidemia, unspecified: Secondary | ICD-10-CM | POA: Diagnosis not present

## 2015-08-26 DIAGNOSIS — Z9189 Other specified personal risk factors, not elsewhere classified: Secondary | ICD-10-CM | POA: Diagnosis not present

## 2015-08-26 DIAGNOSIS — F331 Major depressive disorder, recurrent, moderate: Secondary | ICD-10-CM | POA: Diagnosis not present

## 2015-08-26 DIAGNOSIS — J301 Allergic rhinitis due to pollen: Secondary | ICD-10-CM | POA: Diagnosis not present

## 2015-08-26 DIAGNOSIS — E6609 Other obesity due to excess calories: Secondary | ICD-10-CM | POA: Diagnosis not present

## 2015-08-26 DIAGNOSIS — E785 Hyperlipidemia, unspecified: Secondary | ICD-10-CM | POA: Diagnosis not present

## 2015-08-26 DIAGNOSIS — Z1389 Encounter for screening for other disorder: Secondary | ICD-10-CM | POA: Diagnosis not present

## 2015-08-27 DIAGNOSIS — R2689 Other abnormalities of gait and mobility: Secondary | ICD-10-CM | POA: Diagnosis not present

## 2015-08-27 DIAGNOSIS — Z471 Aftercare following joint replacement surgery: Secondary | ICD-10-CM | POA: Diagnosis not present

## 2015-08-27 DIAGNOSIS — Z96651 Presence of right artificial knee joint: Secondary | ICD-10-CM | POA: Diagnosis not present

## 2015-08-29 ENCOUNTER — Ambulatory Visit (INDEPENDENT_AMBULATORY_CARE_PROVIDER_SITE_OTHER): Payer: Medicare Other

## 2015-08-29 ENCOUNTER — Other Ambulatory Visit: Payer: Medicare Other

## 2015-08-29 ENCOUNTER — Other Ambulatory Visit: Payer: Self-pay | Admitting: Orthopedic Surgery

## 2015-08-29 DIAGNOSIS — Z96651 Presence of right artificial knee joint: Secondary | ICD-10-CM | POA: Diagnosis not present

## 2015-08-29 DIAGNOSIS — R52 Pain, unspecified: Secondary | ICD-10-CM

## 2015-08-29 DIAGNOSIS — Z471 Aftercare following joint replacement surgery: Secondary | ICD-10-CM | POA: Diagnosis not present

## 2015-08-29 DIAGNOSIS — M25561 Pain in right knee: Secondary | ICD-10-CM

## 2015-10-11 DIAGNOSIS — Z96651 Presence of right artificial knee joint: Secondary | ICD-10-CM | POA: Diagnosis not present

## 2015-10-11 DIAGNOSIS — Z471 Aftercare following joint replacement surgery: Secondary | ICD-10-CM | POA: Diagnosis not present

## 2016-01-10 ENCOUNTER — Other Ambulatory Visit: Payer: Self-pay

## 2016-01-17 DIAGNOSIS — M79672 Pain in left foot: Secondary | ICD-10-CM | POA: Diagnosis not present

## 2016-02-04 DIAGNOSIS — R300 Dysuria: Secondary | ICD-10-CM | POA: Diagnosis not present

## 2016-02-28 DIAGNOSIS — E785 Hyperlipidemia, unspecified: Secondary | ICD-10-CM | POA: Diagnosis not present

## 2016-02-28 DIAGNOSIS — I10 Essential (primary) hypertension: Secondary | ICD-10-CM | POA: Diagnosis not present

## 2016-02-28 DIAGNOSIS — F331 Major depressive disorder, recurrent, moderate: Secondary | ICD-10-CM | POA: Diagnosis not present

## 2016-02-28 DIAGNOSIS — M1990 Unspecified osteoarthritis, unspecified site: Secondary | ICD-10-CM | POA: Diagnosis not present

## 2016-03-05 DIAGNOSIS — J301 Allergic rhinitis due to pollen: Secondary | ICD-10-CM | POA: Diagnosis not present

## 2016-03-05 DIAGNOSIS — Z23 Encounter for immunization: Secondary | ICD-10-CM | POA: Diagnosis not present

## 2016-03-05 DIAGNOSIS — E6609 Other obesity due to excess calories: Secondary | ICD-10-CM | POA: Diagnosis not present

## 2016-03-05 DIAGNOSIS — E785 Hyperlipidemia, unspecified: Secondary | ICD-10-CM | POA: Diagnosis not present

## 2016-03-05 DIAGNOSIS — M7062 Trochanteric bursitis, left hip: Secondary | ICD-10-CM | POA: Diagnosis not present

## 2016-03-05 DIAGNOSIS — Z9189 Other specified personal risk factors, not elsewhere classified: Secondary | ICD-10-CM | POA: Diagnosis not present

## 2016-03-05 DIAGNOSIS — F331 Major depressive disorder, recurrent, moderate: Secondary | ICD-10-CM | POA: Diagnosis not present

## 2016-03-20 DIAGNOSIS — Z1211 Encounter for screening for malignant neoplasm of colon: Secondary | ICD-10-CM | POA: Diagnosis not present

## 2016-03-20 DIAGNOSIS — Z1212 Encounter for screening for malignant neoplasm of rectum: Secondary | ICD-10-CM | POA: Diagnosis not present

## 2016-03-24 DIAGNOSIS — M549 Dorsalgia, unspecified: Secondary | ICD-10-CM | POA: Diagnosis not present

## 2016-03-24 DIAGNOSIS — M25552 Pain in left hip: Secondary | ICD-10-CM | POA: Diagnosis not present

## 2016-03-24 DIAGNOSIS — M7062 Trochanteric bursitis, left hip: Secondary | ICD-10-CM | POA: Diagnosis not present

## 2016-03-26 DIAGNOSIS — M549 Dorsalgia, unspecified: Secondary | ICD-10-CM | POA: Diagnosis not present

## 2016-03-26 DIAGNOSIS — M7062 Trochanteric bursitis, left hip: Secondary | ICD-10-CM | POA: Diagnosis not present

## 2016-03-26 DIAGNOSIS — M25552 Pain in left hip: Secondary | ICD-10-CM | POA: Diagnosis not present

## 2016-03-30 DIAGNOSIS — M7062 Trochanteric bursitis, left hip: Secondary | ICD-10-CM | POA: Diagnosis not present

## 2016-03-30 DIAGNOSIS — M25552 Pain in left hip: Secondary | ICD-10-CM | POA: Diagnosis not present

## 2016-03-30 DIAGNOSIS — M549 Dorsalgia, unspecified: Secondary | ICD-10-CM | POA: Diagnosis not present

## 2016-03-31 DIAGNOSIS — M25552 Pain in left hip: Secondary | ICD-10-CM | POA: Diagnosis not present

## 2016-03-31 DIAGNOSIS — M7062 Trochanteric bursitis, left hip: Secondary | ICD-10-CM | POA: Diagnosis not present

## 2016-03-31 DIAGNOSIS — M549 Dorsalgia, unspecified: Secondary | ICD-10-CM | POA: Diagnosis not present

## 2016-04-07 DIAGNOSIS — M25552 Pain in left hip: Secondary | ICD-10-CM | POA: Diagnosis not present

## 2016-04-07 DIAGNOSIS — M549 Dorsalgia, unspecified: Secondary | ICD-10-CM | POA: Diagnosis not present

## 2016-04-07 DIAGNOSIS — M7062 Trochanteric bursitis, left hip: Secondary | ICD-10-CM | POA: Diagnosis not present

## 2016-04-09 ENCOUNTER — Other Ambulatory Visit: Payer: Self-pay | Admitting: Family Medicine

## 2016-04-09 DIAGNOSIS — M549 Dorsalgia, unspecified: Secondary | ICD-10-CM | POA: Diagnosis not present

## 2016-04-09 DIAGNOSIS — M25552 Pain in left hip: Secondary | ICD-10-CM | POA: Diagnosis not present

## 2016-04-09 DIAGNOSIS — M7062 Trochanteric bursitis, left hip: Secondary | ICD-10-CM | POA: Diagnosis not present

## 2016-04-09 DIAGNOSIS — Z1231 Encounter for screening mammogram for malignant neoplasm of breast: Secondary | ICD-10-CM

## 2016-04-14 DIAGNOSIS — M25552 Pain in left hip: Secondary | ICD-10-CM | POA: Diagnosis not present

## 2016-04-16 DIAGNOSIS — M25552 Pain in left hip: Secondary | ICD-10-CM | POA: Diagnosis not present

## 2016-04-21 DIAGNOSIS — M25552 Pain in left hip: Secondary | ICD-10-CM | POA: Diagnosis not present

## 2016-04-28 DIAGNOSIS — M25552 Pain in left hip: Secondary | ICD-10-CM | POA: Diagnosis not present

## 2016-04-30 DIAGNOSIS — M25552 Pain in left hip: Secondary | ICD-10-CM | POA: Diagnosis not present

## 2016-05-07 DIAGNOSIS — M25552 Pain in left hip: Secondary | ICD-10-CM | POA: Diagnosis not present

## 2016-05-12 DIAGNOSIS — M7062 Trochanteric bursitis, left hip: Secondary | ICD-10-CM | POA: Diagnosis not present

## 2016-05-12 DIAGNOSIS — M25552 Pain in left hip: Secondary | ICD-10-CM | POA: Diagnosis not present

## 2016-05-12 DIAGNOSIS — M549 Dorsalgia, unspecified: Secondary | ICD-10-CM | POA: Diagnosis not present

## 2016-05-14 ENCOUNTER — Ambulatory Visit: Payer: Medicare Other

## 2016-05-14 DIAGNOSIS — M7062 Trochanteric bursitis, left hip: Secondary | ICD-10-CM | POA: Diagnosis not present

## 2016-05-14 DIAGNOSIS — M549 Dorsalgia, unspecified: Secondary | ICD-10-CM | POA: Diagnosis not present

## 2016-05-14 DIAGNOSIS — M25552 Pain in left hip: Secondary | ICD-10-CM | POA: Diagnosis not present

## 2016-05-15 ENCOUNTER — Ambulatory Visit
Admission: RE | Admit: 2016-05-15 | Discharge: 2016-05-15 | Disposition: A | Discharge status: Home / Self Care | Payer: Medicare Other | Source: Ambulatory Visit | Attending: Family Medicine | Admitting: Family Medicine

## 2016-05-15 DIAGNOSIS — Z1231 Encounter for screening mammogram for malignant neoplasm of breast: Secondary | ICD-10-CM

## 2016-06-11 DIAGNOSIS — Z471 Aftercare following joint replacement surgery: Secondary | ICD-10-CM | POA: Diagnosis not present

## 2016-06-11 DIAGNOSIS — Z96651 Presence of right artificial knee joint: Secondary | ICD-10-CM | POA: Diagnosis not present

## 2016-06-11 DIAGNOSIS — T8482XA Fibrosis due to internal orthopedic prosthetic devices, implants and grafts, initial encounter: Secondary | ICD-10-CM | POA: Diagnosis not present

## 2016-06-16 DIAGNOSIS — M25861 Other specified joint disorders, right knee: Secondary | ICD-10-CM | POA: Diagnosis not present

## 2016-06-16 DIAGNOSIS — T8482XA Fibrosis due to internal orthopedic prosthetic devices, implants and grafts, initial encounter: Secondary | ICD-10-CM | POA: Diagnosis not present

## 2016-06-19 DIAGNOSIS — M25861 Other specified joint disorders, right knee: Secondary | ICD-10-CM | POA: Diagnosis not present

## 2016-06-19 DIAGNOSIS — T8482XA Fibrosis due to internal orthopedic prosthetic devices, implants and grafts, initial encounter: Secondary | ICD-10-CM | POA: Diagnosis not present

## 2016-06-24 DIAGNOSIS — T8482XA Fibrosis due to internal orthopedic prosthetic devices, implants and grafts, initial encounter: Secondary | ICD-10-CM | POA: Diagnosis not present

## 2016-06-24 DIAGNOSIS — M25861 Other specified joint disorders, right knee: Secondary | ICD-10-CM | POA: Diagnosis not present

## 2016-06-26 DIAGNOSIS — M25861 Other specified joint disorders, right knee: Secondary | ICD-10-CM | POA: Diagnosis not present

## 2016-06-26 DIAGNOSIS — T8482XA Fibrosis due to internal orthopedic prosthetic devices, implants and grafts, initial encounter: Secondary | ICD-10-CM | POA: Diagnosis not present

## 2016-06-29 DIAGNOSIS — M25861 Other specified joint disorders, right knee: Secondary | ICD-10-CM | POA: Diagnosis not present

## 2016-06-29 DIAGNOSIS — T8482XA Fibrosis due to internal orthopedic prosthetic devices, implants and grafts, initial encounter: Secondary | ICD-10-CM | POA: Diagnosis not present

## 2016-07-01 DIAGNOSIS — T8482XA Fibrosis due to internal orthopedic prosthetic devices, implants and grafts, initial encounter: Secondary | ICD-10-CM | POA: Diagnosis not present

## 2016-07-01 DIAGNOSIS — M25861 Other specified joint disorders, right knee: Secondary | ICD-10-CM | POA: Diagnosis not present

## 2016-07-06 DIAGNOSIS — M25861 Other specified joint disorders, right knee: Secondary | ICD-10-CM | POA: Diagnosis not present

## 2016-07-06 DIAGNOSIS — T8482XA Fibrosis due to internal orthopedic prosthetic devices, implants and grafts, initial encounter: Secondary | ICD-10-CM | POA: Diagnosis not present

## 2016-07-08 DIAGNOSIS — M25861 Other specified joint disorders, right knee: Secondary | ICD-10-CM | POA: Diagnosis not present

## 2016-07-08 DIAGNOSIS — T8482XA Fibrosis due to internal orthopedic prosthetic devices, implants and grafts, initial encounter: Secondary | ICD-10-CM | POA: Diagnosis not present

## 2016-07-23 DIAGNOSIS — Z471 Aftercare following joint replacement surgery: Secondary | ICD-10-CM | POA: Diagnosis not present

## 2016-07-23 DIAGNOSIS — Z96651 Presence of right artificial knee joint: Secondary | ICD-10-CM | POA: Diagnosis not present

## 2016-08-11 DIAGNOSIS — M659 Synovitis and tenosynovitis, unspecified: Secondary | ICD-10-CM | POA: Diagnosis not present

## 2016-08-11 DIAGNOSIS — G8918 Other acute postprocedural pain: Secondary | ICD-10-CM | POA: Diagnosis not present

## 2016-08-11 DIAGNOSIS — Z96651 Presence of right artificial knee joint: Secondary | ICD-10-CM | POA: Diagnosis not present

## 2016-08-11 DIAGNOSIS — M25861 Other specified joint disorders, right knee: Secondary | ICD-10-CM | POA: Diagnosis not present

## 2016-08-11 DIAGNOSIS — M67861 Other specified disorders of synovium, right knee: Secondary | ICD-10-CM | POA: Diagnosis not present

## 2016-08-11 DIAGNOSIS — M6751 Plica syndrome, right knee: Secondary | ICD-10-CM | POA: Diagnosis not present

## 2016-08-18 DIAGNOSIS — Z4789 Encounter for other orthopedic aftercare: Secondary | ICD-10-CM | POA: Diagnosis not present

## 2016-09-02 DIAGNOSIS — H40033 Anatomical narrow angle, bilateral: Secondary | ICD-10-CM | POA: Diagnosis not present

## 2016-09-02 DIAGNOSIS — H2513 Age-related nuclear cataract, bilateral: Secondary | ICD-10-CM | POA: Diagnosis not present

## 2016-09-14 DIAGNOSIS — Z4789 Encounter for other orthopedic aftercare: Secondary | ICD-10-CM | POA: Diagnosis not present

## 2016-09-14 DIAGNOSIS — M1612 Unilateral primary osteoarthritis, left hip: Secondary | ICD-10-CM | POA: Diagnosis not present

## 2016-09-16 DIAGNOSIS — G44219 Episodic tension-type headache, not intractable: Secondary | ICD-10-CM | POA: Diagnosis not present

## 2016-09-17 DIAGNOSIS — Z0001 Encounter for general adult medical examination with abnormal findings: Secondary | ICD-10-CM | POA: Diagnosis not present

## 2016-09-17 DIAGNOSIS — E785 Hyperlipidemia, unspecified: Secondary | ICD-10-CM | POA: Diagnosis not present

## 2016-09-17 DIAGNOSIS — I10 Essential (primary) hypertension: Secondary | ICD-10-CM | POA: Diagnosis not present

## 2016-09-17 DIAGNOSIS — F331 Major depressive disorder, recurrent, moderate: Secondary | ICD-10-CM | POA: Diagnosis not present

## 2016-09-21 DIAGNOSIS — J301 Allergic rhinitis due to pollen: Secondary | ICD-10-CM | POA: Diagnosis not present

## 2016-09-21 DIAGNOSIS — E6609 Other obesity due to excess calories: Secondary | ICD-10-CM | POA: Diagnosis not present

## 2016-09-21 DIAGNOSIS — F331 Major depressive disorder, recurrent, moderate: Secondary | ICD-10-CM | POA: Diagnosis not present

## 2016-09-21 DIAGNOSIS — Z9189 Other specified personal risk factors, not elsewhere classified: Secondary | ICD-10-CM | POA: Diagnosis not present

## 2016-09-21 DIAGNOSIS — E785 Hyperlipidemia, unspecified: Secondary | ICD-10-CM | POA: Diagnosis not present

## 2016-09-24 DIAGNOSIS — M1612 Unilateral primary osteoarthritis, left hip: Secondary | ICD-10-CM | POA: Diagnosis not present

## 2016-10-09 DIAGNOSIS — M1612 Unilateral primary osteoarthritis, left hip: Secondary | ICD-10-CM | POA: Diagnosis not present

## 2016-12-09 DIAGNOSIS — L820 Inflamed seborrheic keratosis: Secondary | ICD-10-CM | POA: Diagnosis not present

## 2016-12-09 DIAGNOSIS — L988 Other specified disorders of the skin and subcutaneous tissue: Secondary | ICD-10-CM | POA: Diagnosis not present

## 2016-12-09 DIAGNOSIS — D485 Neoplasm of uncertain behavior of skin: Secondary | ICD-10-CM | POA: Diagnosis not present

## 2016-12-28 DIAGNOSIS — R300 Dysuria: Secondary | ICD-10-CM | POA: Diagnosis not present

## 2017-03-10 DIAGNOSIS — E785 Hyperlipidemia, unspecified: Secondary | ICD-10-CM | POA: Diagnosis not present

## 2017-03-10 DIAGNOSIS — M1990 Unspecified osteoarthritis, unspecified site: Secondary | ICD-10-CM | POA: Diagnosis not present

## 2017-03-10 DIAGNOSIS — F331 Major depressive disorder, recurrent, moderate: Secondary | ICD-10-CM | POA: Diagnosis not present

## 2017-03-10 DIAGNOSIS — I10 Essential (primary) hypertension: Secondary | ICD-10-CM | POA: Diagnosis not present

## 2017-03-10 DIAGNOSIS — Z9189 Other specified personal risk factors, not elsewhere classified: Secondary | ICD-10-CM | POA: Diagnosis not present

## 2017-03-16 DIAGNOSIS — E6609 Other obesity due to excess calories: Secondary | ICD-10-CM | POA: Diagnosis not present

## 2017-03-16 DIAGNOSIS — F331 Major depressive disorder, recurrent, moderate: Secondary | ICD-10-CM | POA: Diagnosis not present

## 2017-03-16 DIAGNOSIS — E785 Hyperlipidemia, unspecified: Secondary | ICD-10-CM | POA: Diagnosis not present

## 2017-03-16 DIAGNOSIS — J301 Allergic rhinitis due to pollen: Secondary | ICD-10-CM | POA: Diagnosis not present

## 2017-03-16 DIAGNOSIS — Z1389 Encounter for screening for other disorder: Secondary | ICD-10-CM | POA: Diagnosis not present

## 2017-03-16 DIAGNOSIS — Z23 Encounter for immunization: Secondary | ICD-10-CM | POA: Diagnosis not present

## 2017-04-14 DIAGNOSIS — M1612 Unilateral primary osteoarthritis, left hip: Secondary | ICD-10-CM | POA: Diagnosis not present

## 2017-04-22 ENCOUNTER — Other Ambulatory Visit: Payer: Self-pay | Admitting: Family Medicine

## 2017-04-22 DIAGNOSIS — Z1231 Encounter for screening mammogram for malignant neoplasm of breast: Secondary | ICD-10-CM

## 2017-05-21 ENCOUNTER — Ambulatory Visit
Admission: RE | Admit: 2017-05-21 | Discharge: 2017-05-21 | Disposition: A | Discharge status: Home / Self Care | Payer: Medicare Other | Source: Ambulatory Visit | Attending: Family Medicine | Admitting: Family Medicine

## 2017-05-21 DIAGNOSIS — Z1231 Encounter for screening mammogram for malignant neoplasm of breast: Secondary | ICD-10-CM

## 2017-05-25 ENCOUNTER — Ambulatory Visit: Payer: Self-pay | Admitting: Orthopedic Surgery

## 2017-05-27 DIAGNOSIS — M1612 Unilateral primary osteoarthritis, left hip: Secondary | ICD-10-CM | POA: Diagnosis not present

## 2017-06-23 ENCOUNTER — Encounter (HOSPITAL_COMMUNITY): Payer: Self-pay

## 2017-06-24 ENCOUNTER — Encounter (INDEPENDENT_AMBULATORY_CARE_PROVIDER_SITE_OTHER): Payer: Self-pay

## 2017-06-24 ENCOUNTER — Other Ambulatory Visit: Payer: Self-pay

## 2017-06-24 ENCOUNTER — Encounter (HOSPITAL_COMMUNITY): Payer: Self-pay

## 2017-06-24 ENCOUNTER — Encounter (HOSPITAL_COMMUNITY)
Admission: RE | Admit: 2017-06-24 | Discharge: 2017-06-24 | Disposition: A | Discharge status: Home / Self Care | Payer: Medicare Other | Source: Ambulatory Visit | Attending: Orthopedic Surgery | Admitting: Orthopedic Surgery

## 2017-06-24 ENCOUNTER — Ambulatory Visit: Payer: Self-pay | Admitting: Orthopedic Surgery

## 2017-06-24 DIAGNOSIS — Z01812 Encounter for preprocedural laboratory examination: Secondary | ICD-10-CM | POA: Diagnosis not present

## 2017-06-24 HISTORY — DX: Unspecified osteoarthritis, unspecified site: M19.90

## 2017-06-24 HISTORY — DX: Presence of spectacles and contact lenses: Z97.3

## 2017-06-24 LAB — COMPREHENSIVE METABOLIC PANEL
ALT: 25 U/L (ref 14–54)
AST: 26 U/L (ref 15–41)
Albumin: 4.4 g/dL (ref 3.5–5.0)
Alkaline Phosphatase: 117 U/L (ref 38–126)
Anion gap: 13 (ref 5–15)
BILIRUBIN TOTAL: 0.7 mg/dL (ref 0.3–1.2)
BUN: 14 mg/dL (ref 6–20)
CHLORIDE: 103 mmol/L (ref 101–111)
CO2: 26 mmol/L (ref 22–32)
CREATININE: 0.74 mg/dL (ref 0.44–1.00)
Calcium: 9.9 mg/dL (ref 8.9–10.3)
GFR calc Af Amer: >60 mL/min (ref >60)
Glucose, Bld: 100 mg/dL — ABNORMAL HIGH (ref 65–99)
Potassium: 4.3 mmol/L (ref 3.5–5.1)
Sodium: 142 mmol/L (ref 135–145)
TOTAL PROTEIN: 8 g/dL (ref 6.5–8.1)

## 2017-06-24 LAB — CBC
HEMATOCRIT: 41.6 % (ref 36.0–46.0)
Hemoglobin: 13.9 g/dL (ref 12.0–15.0)
MCH: 30.7 pg (ref 26.0–34.0)
MCHC: 33.4 g/dL (ref 30.0–36.0)
MCV: 91.8 fL (ref 78.0–100.0)
PLATELETS: 401 10*3/uL — AB (ref 150–400)
RBC: 4.53 MIL/uL (ref 3.87–5.11)
RDW: 12.5 % (ref 11.5–15.5)
WBC: 6.4 10*3/uL (ref 4.0–10.5)

## 2017-06-24 LAB — APTT: aPTT: 30 seconds (ref 24–36)

## 2017-06-24 LAB — SURGICAL PCR SCREEN
MRSA, PCR: NEGATIVE
STAPHYLOCOCCUS AUREUS: NEGATIVE

## 2017-06-24 LAB — PROTIME-INR
INR: 0.97
PROTHROMBIN TIME: 12.8 s (ref 11.4–15.2)

## 2017-06-24 NOTE — H&P (View-Only) (Signed)
Name Christina Zhang, Christina Zhang (29XA, F)  DOB 09/15/1945   Chief Complaint Left Hip Pain H&P left THA 06/30/2016 at [facility] Primary Care Provider: Caryl Bis: [place], [place], [place] [address], Ph [phone], Fax [phone] NPI: [id number] Patient's Pharmacies [facility] [place] [address], [place] [address], Ph [phone], Fax [phone]  Vitals Ht: 5 ft 5 in  Wt: 216 lbs  BMI: 35.9 Pain Scale: 8 BP - 154/92 Pulse - 89  Allergies Reviewed Allergies CIPRO  FLAGYL: Facial swelling - Lip Swelling  HYDROCODONE: Vomiting (Mild to moderate)  OXYCODONE: Irregular heart rate (Moderate to severe)    Medications Reviewed Medications hydroCHLOROthiazide 25 mg tablet 04/29/17   filled [place] sertraline 100 mg tablet 04/12/17   filled [place] Vitamin D3 05/27/17   entered Loura Back   Problems Reviewed Problems Osteoarthritis of left hip joint  History of right total knee replacement   Family History Reviewed Family History Sister - Osteoporosis Father - Heart failure   - Diabetes mellitus Mother - Amyotrophic lateral sclerosis   Social History Reviewed Social History Smoking Status: Never smoker Chewing tobacco: none Alcohol intake: Occasional Hand Dominance: Right Work related injury?: N Advance directive: Y Freight forwarder of Attorney: Y  Surgical History Reviewed Surgical History Diagnostic colonoscopy Carpal Tunnel Release - Right Knee Arthroscopy - Right Knee Dilation and curettage of uterus Total replacement of right knee joint - 07/22/2015 Tubal ligation - 05/12/1975   Past Medical History Reviewed Past Medical History Depression: Y Hypertension: Y Previous Cortisone Injection(s): Y Notes: Diverticular Disease,  Measels,  Mumps    HPI The patient is scheduled for a left THA 06/30/2017 at [facility] by Dr. Wynelle Link. The patient is a 72 year old  female who is being followed for their left hip pain and osteoarthritis. They are months out from intra-articular injection. Symptoms reported include: pain, aching, stiffness, pain with weightbearing and difficulty ambulating. The patient feels that they are doing poorly and report their pain level to be mild to moderate. Current treatment includes: home exercise program. The following medication has been used for pain control: antiinflammatory medication (ibuprofen prn). The patient has reported improvement of their symptoms with: Cortisone injections (she did get some relief but states that the injection really only helped for about a week). Shanesha has tried oral anti-inflammatories which did not help she had a intra-articular injection which helped for about 2 weeks and then quit working. She continues to have pain in the anterior groin radiating all the way down to the knee. It is interfering with her life and stopping her from doing activities. She has reached a point where she is ready to get something done. Risks and benefits from a total hip arthroplasty has been discussed with the patient and she elects to proceed with surgery.   ROS Constitutional: Constitutional: Some seasonal allergies.  Cardiovascular: Cardiovascular: no palpitations or chest pain.  Respiratory: Respiratory: no cough or shortness of breath and No COPD.  Gastrointestinal: Gastrointestinal: no vomiting or nausea.  Musculoskeletal: Musculoskeletal: Joint Pain.  Neurologic: Neurologic: no numbness, tingling, or difficulty with balance.   Physical Exam Patient is a 72 year old female.  General Mental Status - Alert, cooperative and good historian. General Appearance - pleasant, Not in acute distress. Orientation - Oriented X3. Build & Nutrition - Well nourished and Well developed.  Head and Neck Head - normocephalic, atraumatic . Neck Global Assessment -  supple, no bruit auscultated on the right, no bruit  auscultated on the left.  Eye Wears glasses Pupil - Bilateral - PERR Motion - Bilateral - EOMI.  Chest and Lung Exam Auscultation Breath sounds - clear at anterior chest wall and clear at posterior chest wall. Adventitious sounds - No Adventitious sounds.  Cardiovascular Auscultation Rhythm - Regular rate and rhythm. Heart Sounds - S1 WNL and S2 WNL. Murmurs & Other Heart Sounds - Auscultation of the heart reveals - No Murmurs.  Abdomen Palpation/Percussion Tenderness - Abdomen is non-tender to palpation. Abdomen is soft. Auscultation Auscultation of the abdomen reveals - Bowel sounds normal.  Genitourinary Note: Not done, not pertinent to present illness  Musculoskeletal Hip shows full extension, flexion to 115, external rotation of 25 with pain in the anterior groin. Internal rotation of 5 with pain in the anterior groin. Sensation and circulation are intact.  X-rays show advanced osteoarthritis in the left hip with cystic degenerative changes on both the acetabular and femoral head side and bone-on-bone superior lateral joint space narrowing with large lateral and medial osteophytes.    Assessment / Plan 1. Osteoarthritis of hip M16.12: Unilateral primary osteoarthritis, left hip  Patient Instructions Surgical Plans:  Left Total Hip Replacement - Anterior Approach Disposition: Home, Daughter to Assist, HHPT PCP: Dr. Quillian Quince - seen and cleared for surgery IV TXA Anesthesia Issues: Nausea/vomiting once in the past Patient was instructed on what medications to stop prior to surgery. - Follow up visit in 2 weeks with Dr. Wynelle Link - Begin physical therapy following surgery - Pre-operative lab work as pre Pre-Surgical Testing - Prescriptions will be provided in hospital at time of discharge  Return to [place], MD for [place] at [facility] on 07/13/2017 at 02:30 PM  Encounter signed-off by Mickel Crow, PA-C

## 2017-06-24 NOTE — H&P (Signed)
Name Christina Zhang, Christina Zhang (89EQ, F)  DOB [birth year]   Chief Complaint Left Hip Pain H&P left THA 06/30/2016 at [facility] Primary Care Provider: Caryl Bis: [place], [place], [place] [address], Ph [phone], Fax [phone] NPI: [id number] Patient's Pharmacies [facility] [place], [place] [address], [place] [address], Ph [phone], Fax [phone]  Vitals Ht: 5 ft 5 in  Wt: 216 lbs  BMI: 35.9 Pain Scale: 8 BP - 154/92 Pulse - 89  Allergies Reviewed Allergies CIPRO  FLAGYL: Facial swelling - Lip Swelling  HYDROCODONE: Vomiting (Mild to moderate)  OXYCODONE: Irregular heart rate (Moderate to severe)    Medications Reviewed Medications hydroCHLOROthiazide 25 mg tablet 04/29/17   filled [place] sertraline 100 mg tablet 04/12/17   filled [place] Vitamin D3 05/27/17   entered [facility]ra Back   Problems Reviewed Problems Osteoarthritis of left hip joint  History of right total knee replacement   Family History Reviewed Family History Sister - Osteoporosis Father - Heart failure   - Diabetes mellitus Mother - Amyotrophic lateral sclerosis   Social History Reviewed Social History Smoking Status: Never smoker Chewing tobacco: none Alcohol intake: Occasional Hand Dominance: Right Work related injury?: N Advance directive: Y Freight forwarder of Attorney: Y  Surgical History Reviewed Surgical History Diagnostic colonoscopy Carpal Tunnel Release - Right Knee Arthroscopy - Right Knee Dilation and curettage of uterus Total replacement of right knee joint - 07/22/2015 Tubal ligation - 05/12/1975   Past Medical History Reviewed Past Medical History Depression: Y Hypertension: Y Previous Cortisone Injection(s): Y Notes: Diverticular Disease,  Measels,  Mumps    HPI The patient is scheduled for a left THA 06/30/2017 at [facility] by Dr. Wynelle Link. The patient is a 72 year old  female who is being followed for their left hip pain and osteoarthritis. They are months out from intra-articular injection. Symptoms reported include: pain, aching, stiffness, pain with weightbearing and difficulty ambulating. The patient feels that they are doing poorly and report their pain level to be mild to moderate. Current treatment includes: home exercise program. The following medication has been used for pain control: antiinflammatory medication (ibuprofen prn). The patient has reported improvement of their symptoms with: Cortisone injections (she did get some relief but states that the injection really only helped for about a week). Christina Zhang has tried oral anti-inflammatories which did not help she had a intra-articular injection which helped for about 2 weeks and then quit working. She continues to have pain in the anterior groin radiating all the way down to the knee. It is interfering with her life and stopping her from doing activities. She has reached a point where she is ready to get something done. Risks and benefits from a total hip arthroplasty has been discussed with the patient and she elects to proceed with surgery.   ROS Constitutional: Constitutional: Some seasonal allergies.  Cardiovascular: Cardiovascular: no palpitations or chest pain.  Respiratory: Respiratory: no cough or shortness of breath and No COPD.  Gastrointestinal: Gastrointestinal: no vomiting or nausea.  Musculoskeletal: Musculoskeletal: Joint Pain.  Neurologic: Neurologic: no numbness, tingling, or difficulty with balance.   Physical Exam Patient is a 72 year old female.  General Mental Status - Alert, cooperative and good historian. General Appearance - pleasant, Not in acute distress. Orientation - Oriented X3. Build & Nutrition - Well nourished and Well developed.  Head and Neck Head - normocephalic, atraumatic . Neck Global Assessment -  supple, no bruit auscultated on the right, no bruit  auscultated on the left.  Eye Wears glasses Pupil - Bilateral - PERR Motion - Bilateral - EOMI.  Chest and Lung Exam Auscultation Breath sounds - clear at anterior chest wall and clear at posterior chest wall. Adventitious sounds - No Adventitious sounds.  Cardiovascular Auscultation Rhythm - Regular rate and rhythm. Heart Sounds - S1 WNL and S2 WNL. Murmurs & Other Heart Sounds - Auscultation of the heart reveals - No Murmurs.  Abdomen Palpation/Percussion Tenderness - Abdomen is non-tender to palpation. Abdomen is soft. Auscultation Auscultation of the abdomen reveals - Bowel sounds normal.  Genitourinary Note: Not done, not pertinent to present illness  Musculoskeletal Hip shows full extension, flexion to 115, external rotation of 25 with pain in the anterior groin. Internal rotation of 5 with pain in the anterior groin. Sensation and circulation are intact.  X-rays show advanced osteoarthritis in the left hip with cystic degenerative changes on both the acetabular and femoral head side and bone-on-bone superior lateral joint space narrowing with large lateral and medial osteophytes.    Assessment / Plan 1. Osteoarthritis of hip M16.12: Unilateral primary osteoarthritis, left hip  Patient Instructions Surgical Plans:  Left Total Hip Replacement - Anterior Approach Disposition: Home, Daughter to Assist, HHPT PCP: Dr. Quillian Quince - seen and cleared for surgery IV TXA Anesthesia Issues: Nausea/vomiting once in the past Patient was instructed on what medications to stop prior to surgery. - Follow up visit in 2 weeks with Dr. Wynelle Link - Begin physical therapy following surgery - Pre-operative lab work as pre Pre-Surgical Testing - Prescriptions will be provided in hospital at time of discharge  Return to [place], MD for [place] at [facility] on 07/13/2017 at 02:30 PM  Encounter signed-off by Mickel Crow, PA-C

## 2017-06-24 NOTE — Progress Notes (Signed)
Clearance dated 03-16-2017 from dr Quillian Quince, pt pcp, in chart.

## 2017-06-24 NOTE — Patient Instructions (Signed)
Christina Zhang  06/24/2017   Your procedure is scheduled on: 06-30-2017  Report to [facility] Main  Entrance  Report to admitting at   6:00 AM Follow signs to Short Stay on first floor at 530 AM  Call this number if you have problems the morning of surgery [phone]   Remember: Do not eat food or drink liquids :After Midnight.     Take these medicines the morning of surgery with A SIP OF WATER:  Zoloft                                You may not have any metal on your body including hair pins and              piercings  Do not wear jewelry, make-up, lotions, powders or perfumes, deodorant             Do not wear nail polish.  Do not shave  48 hours prior to surgery.                Do not bring valuables to the hospital. [place].  Contacts, dentures or bridgework may not be worn into surgery.  Leave suitcase in the car. After surgery it may be brought to your room.      Special Instructions:  Deep breathing and leg exercises              Please read over the following fact sheets you were given: _____________________________________________________________________             [facility] - Preparing for Surgery Before surgery, you can play an important role.  Because skin is not sterile, your skin needs to be as free of germs as possible.  You can reduce the number of germs on your skin by washing with CHG (chlorahexidine gluconate) soap before surgery.  CHG is an antiseptic cleaner which kills germs and bonds with the skin to continue killing germs even after washing. Please DO NOT use if you have an allergy to CHG or antibacterial soaps.  If your skin becomes reddened/irritated stop using the CHG and inform your nurse when you arrive at Short Stay. Do not shave (including legs and underarms) for at least 48 hours prior to the first CHG shower.  You may shave your face/neck. Please follow these  instructions carefully:  1.  Shower with CHG Soap the night before surgery and the  morning of Surgery.  2.  If you choose to wash your hair, wash your hair first as usual with your  normal  shampoo.  3.  After you shampoo, rinse your hair and body thoroughly to remove the  shampoo.                           4.  Use CHG as you would any other liquid soap.  You can apply chg directly  to the skin and wash                       Gently with a scrungie or clean washcloth.  5.  Apply the CHG Soap to your body ONLY FROM THE NECK DOWN.  Do not use on face/ open                           Wound or open sores. Avoid contact with eyes, ears mouth and genitals (private parts).                       Wash face,  Genitals (private parts) with your normal soap.             6.  Wash thoroughly, paying special attention to the area where your surgery  will be performed.  7.  Thoroughly rinse your body with warm water from the neck down.  8.  DO NOT shower/wash with your normal soap after using and rinsing off  the CHG Soap.                9.  Pat yourself dry with a clean towel.            10.  Wear clean pajamas.            11.  Place clean sheets on your bed the night of your first shower and do not  sleep with pets. Day of Surgery : Do not apply any lotions/deodorants the morning of surgery.  Please wear clean clothes to the hospital/surgery center.  FAILURE TO FOLLOW THESE INSTRUCTIONS MAY RESULT IN THE CANCELLATION OF YOUR SURGERY PATIENT SIGNATURE_________________________________  NURSE SIGNATURE__________________________________  ________________________________________________________________________   Adam Phenix  An incentive spirometer is a tool that can help keep your lungs clear and active. This tool measures how well you are filling your lungs with each breath. Taking long deep breaths may help reverse or decrease the chance of developing breathing (pulmonary) problems (especially  infection) following:  A long period of time when you are unable to move or be active. BEFORE THE PROCEDURE   If the spirometer includes an indicator to show your best effort, your nurse or respiratory therapist will set it to a desired goal.  If possible, sit up straight or lean slightly forward. Try not to slouch.  Hold the incentive spirometer in an upright position. INSTRUCTIONS FOR USE  1. Sit on the edge of your bed if possible, or sit up as far as you can in bed or on a chair. 2. Hold the incentive spirometer in an upright position. 3. Breathe out normally. 4. Place the mouthpiece in your mouth and seal your lips tightly around it. 5. Breathe in slowly and as deeply as possible, raising the piston or the ball toward the top of the column. 6. Hold your breath for 3-5 seconds or for as long as possible. Allow the piston or ball to fall to the bottom of the column. 7. Remove the mouthpiece from your mouth and breathe out normally. 8. Rest for a few seconds and repeat Steps 1 through 7 at least 10 times every 1-2 hours when you are awake. Take your time and take a few normal breaths between deep breaths. 9. The spirometer may include an indicator to show your best effort. Use the indicator as a goal to work toward during each repetition. 10. After each set of 10 deep breaths, practice coughing to be sure your lungs are clear. If you have an incision (the cut made at the time of surgery), support your incision when coughing by placing a pillow or rolled up towels firmly against it. Once you are able to get out of  bed, walk around indoors and cough well. You may stop using the incentive spirometer when instructed by your caregiver.  RISKS AND COMPLICATIONS  Take your time so you do not get dizzy or light-headed.  If you are in pain, you may need to take or ask for pain medication before doing incentive spirometry. It is harder to take a deep breath if you are having pain. AFTER  USE  Rest and breathe slowly and easily.  It can be helpful to keep track of a log of your progress. Your caregiver can provide you with a simple table to help with this. If you are using the spirometer at home, follow these instructions: [place] IF:   You are having difficultly using the spirometer.  You have trouble using the spirometer as often as instructed.  Your pain medication is not giving enough relief while using the spirometer.  You develop fever of 100.5 F (38.1 C) or higher. SEEK IMMEDIATE MEDICAL CARE IF:   You cough up bloody sputum that had not been present before.  You develop fever of 102 F (38.9 C) or greater.  You develop worsening pain at or near the incision site. MAKE SURE YOU:   Understand these instructions.  Will watch your condition.  Will get help right away if you are not doing well or get worse. Document Released: 09/07/2006 Document Revised: 07/20/2011 Document Reviewed: 11/08/2006 ExitCare Patient Information 2014 ExitCare, [place].   ________________________________________________________________________  WHAT IS A BLOOD TRANSFUSION? Blood Transfusion Information  A transfusion is the replacement of blood or some of its parts. Blood is made up of multiple cells which provide different functions.  Red blood cells carry oxygen and are used for blood loss replacement.  White blood cells fight against infection.  Platelets control bleeding.  Plasma helps clot blood.  Other blood products are available for specialized needs, such as hemophilia or other clotting disorders. BEFORE THE TRANSFUSION  Who gives blood for transfusions?   Healthy volunteers who are fully evaluated to make sure their blood is safe. This is blood bank blood. Transfusion therapy is the safest it has ever been in the practice of medicine. Before blood is taken from a donor, a complete history is taken to make sure that person has no history of diseases  nor engages in risky social behavior (examples are intravenous drug use or sexual activity with multiple partners). The donor's travel history is screened to minimize risk of transmitting infections, such as malaria. The donated blood is tested for signs of infectious diseases, such as HIV and hepatitis. The blood is then tested to be sure it is compatible with you in order to minimize the chance of a transfusion reaction. If you or a relative donates blood, this is often done in anticipation of surgery and is not appropriate for emergency situations. It takes many days to process the donated blood. RISKS AND COMPLICATIONS Although transfusion therapy is very safe and saves many lives, the main dangers of transfusion include:   Getting an infectious disease.  Developing a transfusion reaction. This is an allergic reaction to something in the blood you were given. Every precaution is taken to prevent this. The decision to have a blood transfusion has been considered carefully by your caregiver before blood is given. Blood is not given unless the benefits outweigh the risks. AFTER THE TRANSFUSION  Right after receiving a blood transfusion, you will usually feel much better and more energetic. This is especially true if your red blood  cells have gotten low (anemic). The transfusion raises the level of the red blood cells which carry oxygen, and this usually causes an energy increase.  The nurse administering the transfusion will monitor you carefully for complications. HOME CARE INSTRUCTIONS  No special instructions are needed after a transfusion. You may find your energy is better. Speak with your caregiver about any limitations on activity for underlying diseases you may have. SEEK MEDICAL CARE IF:   Your condition is not improving after your transfusion.  You develop redness or irritation at the intravenous (IV) site. SEEK IMMEDIATE MEDICAL CARE IF:  Any of the following symptoms occur over the  next 12 hours:  Shaking chills.  You have a temperature by mouth above 102 F (38.9 C), not controlled by medicine.  Chest, back, or muscle pain.  People around you feel you are not acting correctly or are confused.  Shortness of breath or difficulty breathing.  Dizziness and fainting.  You get a rash or develop hives.  You have a decrease in urine output.  Your urine turns a dark color or changes to pink, red, or Jacober. Any of the following symptoms occur over the next 10 days:  You have a temperature by mouth above 102 F (38.9 C), not controlled by medicine.  Shortness of breath.  Weakness after normal activity.  The white part of the eye turns yellow (jaundice).  You have a decrease in the amount of urine or are urinating less often.  Your urine turns a dark color or changes to pink, red, or St[place]. Document Released: 04/24/2000 Document Revised: 07/20/2011 Document Reviewed: 12/12/2007 [facility] Patient Information 2014 [place], [place].  _______________________________________________________________________

## 2017-06-29 MED ORDER — SODIUM CHLORIDE 0.9 % IV SOLN
1000.0000 mg | INTRAVENOUS | Status: AC
  Administered 2017-06-30: 1000 mg via INTRAVENOUS
  Filled 2017-06-29: qty 1100

## 2017-06-30 ENCOUNTER — Inpatient Hospital Stay (HOSPITAL_COMMUNITY): Payer: Medicare Other

## 2017-06-30 ENCOUNTER — Inpatient Hospital Stay (HOSPITAL_COMMUNITY): Payer: Medicare Other | Admitting: Anesthesiology

## 2017-06-30 ENCOUNTER — Inpatient Hospital Stay (HOSPITAL_COMMUNITY)
Admission: RE | Admit: 2017-06-30 | Discharge: 2017-07-01 | DRG: 470 | Disposition: A | Discharge status: Home / Self Care | Payer: Medicare Other | Source: Ambulatory Visit | Attending: Orthopedic Surgery | Admitting: Orthopedic Surgery

## 2017-06-30 ENCOUNTER — Other Ambulatory Visit: Payer: Self-pay

## 2017-06-30 ENCOUNTER — Encounter (HOSPITAL_COMMUNITY)
Admission: RE | Disposition: A | Discharge status: Home / Self Care | Payer: Self-pay | Source: Ambulatory Visit | Attending: Orthopedic Surgery

## 2017-06-30 ENCOUNTER — Encounter (HOSPITAL_COMMUNITY): Payer: Self-pay | Admitting: Emergency Medicine

## 2017-06-30 DIAGNOSIS — Z96651 Presence of right artificial knee joint: Secondary | ICD-10-CM | POA: Diagnosis present

## 2017-06-30 DIAGNOSIS — M25552 Pain in left hip: Secondary | ICD-10-CM | POA: Diagnosis not present

## 2017-06-30 DIAGNOSIS — M179 Osteoarthritis of knee, unspecified: Secondary | ICD-10-CM | POA: Diagnosis not present

## 2017-06-30 DIAGNOSIS — M1612 Unilateral primary osteoarthritis, left hip: Principal | ICD-10-CM | POA: Diagnosis present

## 2017-06-30 DIAGNOSIS — I10 Essential (primary) hypertension: Secondary | ICD-10-CM | POA: Diagnosis present

## 2017-06-30 DIAGNOSIS — M169 Osteoarthritis of hip, unspecified: Secondary | ICD-10-CM | POA: Diagnosis present

## 2017-06-30 DIAGNOSIS — Z96642 Presence of left artificial hip joint: Secondary | ICD-10-CM | POA: Diagnosis not present

## 2017-06-30 DIAGNOSIS — Z96649 Presence of unspecified artificial hip joint: Secondary | ICD-10-CM

## 2017-06-30 DIAGNOSIS — Z471 Aftercare following joint replacement surgery: Secondary | ICD-10-CM | POA: Diagnosis not present

## 2017-06-30 HISTORY — PX: TOTAL HIP ARTHROPLASTY: SHX124

## 2017-06-30 LAB — TYPE AND SCREEN
ABO/RH(D): A POS
Antibody Screen: NEGATIVE

## 2017-06-30 SURGERY — ARTHROPLASTY, HIP, TOTAL, ANTERIOR APPROACH
Anesthesia: Spinal | Site: Hip | Laterality: Left

## 2017-06-30 MED ORDER — CEFAZOLIN SODIUM-DEXTROSE 2-4 GM/100ML-% IV SOLN
2.0000 g | INTRAVENOUS | Status: AC
  Administered 2017-06-30: 2 g via INTRAVENOUS
  Filled 2017-06-30: qty 100

## 2017-06-30 MED ORDER — ONDANSETRON HCL 4 MG PO TABS: 4.0000 mg | ORAL_TABLET | Freq: Four times a day (QID) | ORAL | Status: DC | PRN

## 2017-06-30 MED ORDER — BUPIVACAINE IN DEXTROSE 0.75-8.25 % IT SOLN
INTRATHECAL | Status: DC | PRN
  Administered 2017-06-30: 2 mL via INTRATHECAL

## 2017-06-30 MED ORDER — MENTHOL 3 MG MT LOZG
1.0000 | LOZENGE | OROMUCOSAL | Status: DC | PRN
  Filled 2017-06-30: qty 9

## 2017-06-30 MED ORDER — ACETAMINOPHEN 650 MG RE SUPP: 650.0000 mg | RECTAL | Status: DC | PRN

## 2017-06-30 MED ORDER — DOCUSATE SODIUM 100 MG PO CAPS
100.0000 mg | ORAL_CAPSULE | Freq: Two times a day (BID) | ORAL | Status: DC
  Administered 2017-06-30 – 2017-07-01 (×2): 100 mg via ORAL
  Filled 2017-06-30 (×2): qty 1

## 2017-06-30 MED ORDER — PHENYLEPHRINE 40 MCG/ML (10ML) SYRINGE FOR IV PUSH (FOR BLOOD PRESSURE SUPPORT)
PREFILLED_SYRINGE | INTRAVENOUS | Status: AC
  Filled 2017-06-30: qty 10

## 2017-06-30 MED ORDER — ONDANSETRON HCL 4 MG/2ML IJ SOLN
INTRAMUSCULAR | Status: AC
  Filled 2017-06-30: qty 2

## 2017-06-30 MED ORDER — ONDANSETRON HCL 4 MG/2ML IJ SOLN: 4.0000 mg | Freq: Four times a day (QID) | INTRAMUSCULAR | Status: DC | PRN

## 2017-06-30 MED ORDER — SERTRALINE HCL 100 MG PO TABS
100.0000 mg | ORAL_TABLET | Freq: Every day | ORAL | Status: DC
  Administered 2017-07-01: 09:00:00 100 mg via ORAL
  Filled 2017-06-30: qty 1

## 2017-06-30 MED ORDER — PROPOFOL 10 MG/ML IV BOLUS
INTRAVENOUS | Status: AC
  Filled 2017-06-30: qty 20

## 2017-06-30 MED ORDER — ACETAMINOPHEN 325 MG PO TABS
650.0000 mg | ORAL_TABLET | ORAL | Status: DC | PRN
  Administered 2017-07-01: 05:00:00 650 mg via ORAL

## 2017-06-30 MED ORDER — METOCLOPRAMIDE HCL 5 MG PO TABS: 5.0000 mg | ORAL_TABLET | Freq: Three times a day (TID) | ORAL | Status: DC | PRN

## 2017-06-30 MED ORDER — PROPOFOL 500 MG/50ML IV EMUL
INTRAVENOUS | Status: DC | PRN
  Administered 2017-06-30: 75 ug/kg/min via INTRAVENOUS

## 2017-06-30 MED ORDER — METOCLOPRAMIDE HCL 5 MG/ML IJ SOLN: 5.0000 mg | Freq: Three times a day (TID) | INTRAMUSCULAR | Status: DC | PRN

## 2017-06-30 MED ORDER — STERILE WATER FOR IRRIGATION IR SOLN
Status: DC | PRN
  Administered 2017-06-30: 2000 mL

## 2017-06-30 MED ORDER — FENTANYL CITRATE (PF) 100 MCG/2ML IJ SOLN
INTRAMUSCULAR | Status: AC
  Filled 2017-06-30: qty 2

## 2017-06-30 MED ORDER — TRANEXAMIC ACID 1000 MG/10ML IV SOLN
1000.0000 mg | Freq: Once | INTRAVENOUS | Status: AC
  Administered 2017-06-30: 1000 mg via INTRAVENOUS
  Filled 2017-06-30: qty 1100

## 2017-06-30 MED ORDER — HYDROMORPHONE HCL 1 MG/ML IJ SOLN
0.2500 mg | INTRAMUSCULAR | Status: DC | PRN
  Administered 2017-06-30: 0.25 mg via INTRAVENOUS
  Administered 2017-06-30: 0.5 mg via INTRAVENOUS
  Administered 2017-06-30: 0.25 mg via INTRAVENOUS
  Administered 2017-06-30: 0.5 mg via INTRAVENOUS

## 2017-06-30 MED ORDER — CEFAZOLIN SODIUM-DEXTROSE 2-4 GM/100ML-% IV SOLN
2.0000 g | Freq: Four times a day (QID) | INTRAVENOUS | Status: AC
  Administered 2017-06-30 (×2): 2 g via INTRAVENOUS
  Filled 2017-06-30 (×2): qty 100

## 2017-06-30 MED ORDER — BUPIVACAINE-EPINEPHRINE 0.25% -1:200000 IJ SOLN
INTRAMUSCULAR | Status: AC
  Filled 2017-06-30: qty 1

## 2017-06-30 MED ORDER — DEXAMETHASONE SODIUM PHOSPHATE 10 MG/ML IJ SOLN
10.0000 mg | Freq: Once | INTRAMUSCULAR | Status: AC
  Administered 2017-07-01: 09:00:00 10 mg via INTRAVENOUS
  Filled 2017-06-30: qty 1

## 2017-06-30 MED ORDER — PROPOFOL 10 MG/ML IV BOLUS
INTRAVENOUS | Status: AC
  Filled 2017-06-30: qty 40

## 2017-06-30 MED ORDER — HYDROMORPHONE HCL 1 MG/ML IJ SOLN: 0.5000 mg | INTRAMUSCULAR | Status: DC | PRN

## 2017-06-30 MED ORDER — ACETAMINOPHEN 10 MG/ML IV SOLN
1000.0000 mg | Freq: Once | INTRAVENOUS | Status: AC
  Administered 2017-06-30: 1000 mg via INTRAVENOUS
  Filled 2017-06-30: qty 100

## 2017-06-30 MED ORDER — ALBUMIN HUMAN 5 % IV SOLN
INTRAVENOUS | Status: DC | PRN
  Administered 2017-06-30: 10:00:00 via INTRAVENOUS

## 2017-06-30 MED ORDER — PROPOFOL 10 MG/ML IV BOLUS
INTRAVENOUS | Status: AC
  Filled 2017-06-30: qty 60

## 2017-06-30 MED ORDER — POLYETHYLENE GLYCOL 3350 17 G PO PACK: 17.0000 g | PACK | Freq: Every day | ORAL | Status: DC | PRN

## 2017-06-30 MED ORDER — PHENYLEPHRINE 40 MCG/ML (10ML) SYRINGE FOR IV PUSH (FOR BLOOD PRESSURE SUPPORT)
PREFILLED_SYRINGE | INTRAVENOUS | Status: DC | PRN
  Administered 2017-06-30 (×2): 80 ug via INTRAVENOUS

## 2017-06-30 MED ORDER — CHLORHEXIDINE GLUCONATE 4 % EX LIQD: 60.0000 mL | Freq: Once | CUTANEOUS | Status: DC

## 2017-06-30 MED ORDER — HYDROMORPHONE HCL 2 MG PO TABS
4.0000 mg | ORAL_TABLET | ORAL | Status: DC | PRN
  Administered 2017-06-30 – 2017-07-01 (×2): 4 mg via ORAL
  Filled 2017-06-30 (×2): qty 2

## 2017-06-30 MED ORDER — 0.9 % SODIUM CHLORIDE (POUR BTL) OPTIME
TOPICAL | Status: DC | PRN
  Administered 2017-06-30: 1000 mL

## 2017-06-30 MED ORDER — DEXAMETHASONE SODIUM PHOSPHATE 10 MG/ML IJ SOLN
INTRAMUSCULAR | Status: AC
  Filled 2017-06-30: qty 1

## 2017-06-30 MED ORDER — ALBUMIN HUMAN 5 % IV SOLN
INTRAVENOUS | Status: AC
  Filled 2017-06-30: qty 250

## 2017-06-30 MED ORDER — ACETAMINOPHEN 500 MG PO TABS
1000.0000 mg | ORAL_TABLET | Freq: Four times a day (QID) | ORAL | Status: DC
  Administered 2017-06-30 – 2017-07-01 (×2): 1000 mg via ORAL
  Filled 2017-06-30 (×3): qty 2

## 2017-06-30 MED ORDER — RIVAROXABAN 10 MG PO TABS
10.0000 mg | ORAL_TABLET | Freq: Every day | ORAL | Status: DC
  Administered 2017-07-01: 10 mg via ORAL
  Filled 2017-06-30: qty 1

## 2017-06-30 MED ORDER — BUPIVACAINE-EPINEPHRINE (PF) 0.25% -1:200000 IJ SOLN
INTRAMUSCULAR | Status: DC | PRN
  Administered 2017-06-30: 30 mL

## 2017-06-30 MED ORDER — PHENYLEPHRINE HCL 10 MG/ML IJ SOLN
INTRAMUSCULAR | Status: AC
  Filled 2017-06-30: qty 1

## 2017-06-30 MED ORDER — DIPHENHYDRAMINE HCL 12.5 MG/5ML PO ELIX: 12.5000 mg | ORAL_SOLUTION | ORAL | Status: DC | PRN

## 2017-06-30 MED ORDER — FLEET ENEMA 7-19 GM/118ML RE ENEM: 1.0000 | ENEMA | Freq: Once | RECTAL | Status: DC | PRN

## 2017-06-30 MED ORDER — FENTANYL CITRATE (PF) 100 MCG/2ML IJ SOLN
INTRAMUSCULAR | Status: DC | PRN
  Administered 2017-06-30 (×2): 50 ug via INTRAVENOUS

## 2017-06-30 MED ORDER — BISACODYL 10 MG RE SUPP: 10.0000 mg | Freq: Every day | RECTAL | Status: DC | PRN

## 2017-06-30 MED ORDER — DEXTROSE 5 % IV SOLN
500.0000 mg | Freq: Four times a day (QID) | INTRAVENOUS | Status: DC | PRN
  Administered 2017-06-30: 500 mg via INTRAVENOUS
  Filled 2017-06-30: qty 550

## 2017-06-30 MED ORDER — PROMETHAZINE HCL 25 MG/ML IJ SOLN: 6.2500 mg | INTRAMUSCULAR | Status: DC | PRN

## 2017-06-30 MED ORDER — PROPOFOL 10 MG/ML IV BOLUS
INTRAVENOUS | Status: DC | PRN
  Administered 2017-06-30: 20 mg via INTRAVENOUS
  Administered 2017-06-30: 40 mg via INTRAVENOUS

## 2017-06-30 MED ORDER — ONDANSETRON HCL 4 MG/2ML IJ SOLN
INTRAMUSCULAR | Status: DC | PRN
  Administered 2017-06-30: 4 mg via INTRAVENOUS

## 2017-06-30 MED ORDER — DEXAMETHASONE SODIUM PHOSPHATE 10 MG/ML IJ SOLN
10.0000 mg | Freq: Once | INTRAMUSCULAR | Status: AC
  Administered 2017-06-30: 10 mg via INTRAVENOUS

## 2017-06-30 MED ORDER — PHENYLEPHRINE HCL 10 MG/ML IJ SOLN
INTRAMUSCULAR | Status: DC | PRN
  Administered 2017-06-30: 60 ug/min via INTRAVENOUS

## 2017-06-30 MED ORDER — HYDROCHLOROTHIAZIDE 25 MG PO TABS
12.5000 mg | ORAL_TABLET | Freq: Every morning | ORAL | Status: DC
  Administered 2017-07-01: 09:00:00 12.5 mg via ORAL
  Filled 2017-06-30: qty 1

## 2017-06-30 MED ORDER — SODIUM CHLORIDE 0.9 % IV SOLN
INTRAVENOUS | Status: DC
  Administered 2017-06-30: 1000 mL via INTRAVENOUS

## 2017-06-30 MED ORDER — METHOCARBAMOL 500 MG PO TABS
500.0000 mg | ORAL_TABLET | Freq: Four times a day (QID) | ORAL | Status: DC | PRN
  Administered 2017-06-30 – 2017-07-01 (×2): 500 mg via ORAL
  Filled 2017-06-30 (×2): qty 1

## 2017-06-30 MED ORDER — HYDROMORPHONE HCL 1 MG/ML IJ SOLN
INTRAMUSCULAR | Status: AC
  Administered 2017-06-30: 0.5 mg via INTRAVENOUS
  Filled 2017-06-30: qty 2

## 2017-06-30 MED ORDER — PHENOL 1.4 % MT LIQD
1.0000 | OROMUCOSAL | Status: DC | PRN
  Filled 2017-06-30: qty 177

## 2017-06-30 MED ORDER — LACTATED RINGERS IV SOLN
INTRAVENOUS | Status: DC
  Administered 2017-06-30 (×2): via INTRAVENOUS

## 2017-06-30 MED ORDER — HYDROMORPHONE HCL 2 MG PO TABS: 2.0000 mg | ORAL_TABLET | ORAL | Status: DC | PRN

## 2017-06-30 SURGICAL SUPPLY — 40 items
BAG DECANTER FOR FLEXI CONT (MISCELLANEOUS) ×3 IMPLANT
BAG SPEC THK2 15X12 ZIP CLS (MISCELLANEOUS)
BAG ZIPLOCK 12X15 (MISCELLANEOUS) IMPLANT
BLADE SAG 18X100X1.27 (BLADE) ×3 IMPLANT
CAPT HIP TOTAL 2 ×2 IMPLANT
CLOSURE WOUND 1/2 X4 (GAUZE/BANDAGES/DRESSINGS) ×1
CLOTH BEACON ORANGE TIMEOUT ST (SAFETY) ×3 IMPLANT
COVER PERINEAL POST (MISCELLANEOUS) ×3 IMPLANT
COVER SURGICAL LIGHT HANDLE (MISCELLANEOUS) ×3 IMPLANT
DECANTER SPIKE VIAL GLASS SM (MISCELLANEOUS) ×3 IMPLANT
DRAPE STERI IOBAN 125X83 (DRAPES) ×3 IMPLANT
DRAPE U-SHAPE 47X51 STRL (DRAPES) ×6 IMPLANT
DRSG ADAPTIC 3X8 NADH LF (GAUZE/BANDAGES/DRESSINGS) ×3 IMPLANT
DRSG MEPILEX BORDER 4X4 (GAUZE/BANDAGES/DRESSINGS) ×3 IMPLANT
DRSG MEPILEX BORDER 4X8 (GAUZE/BANDAGES/DRESSINGS) ×3 IMPLANT
DURAPREP 26ML APPLICATOR (WOUND CARE) ×3 IMPLANT
ELECT REM PT RETURN 15FT ADLT (MISCELLANEOUS) ×3 IMPLANT
EVACUATOR 1/8 PVC DRAIN (DRAIN) ×3 IMPLANT
FACESHIELD WRAPAROUND (MASK) ×3 IMPLANT
FACESHIELD WRAPAROUND OR TEAM (MASK) IMPLANT
GLOVE BIO SURGEON STRL SZ7.5 (GLOVE) ×5 IMPLANT
GLOVE BIO SURGEON STRL SZ8 (GLOVE) ×6 IMPLANT
GLOVE BIOGEL PI IND STRL 8 (GLOVE) ×2 IMPLANT
GLOVE BIOGEL PI INDICATOR 8 (GLOVE) ×4
GLOVE SURG SS PI 6.5 STRL IVOR (GLOVE) ×6 IMPLANT
GOWN SPEC L3 XXLG W/TWL (GOWN DISPOSABLE) ×2 IMPLANT
GOWN STRL REUS W/TWL LRG LVL3 (GOWN DISPOSABLE) ×7 IMPLANT
GOWN STRL REUS W/TWL XL LVL3 (GOWN DISPOSABLE) ×3 IMPLANT
PACK ANTERIOR HIP CUSTOM (KITS) ×3 IMPLANT
STRIP CLOSURE SKIN 1/2X4 (GAUZE/BANDAGES/DRESSINGS) ×2 IMPLANT
SUT ETHIBOND NAB CT1 #1 30IN (SUTURE) ×3 IMPLANT
SUT MNCRL AB 4-0 PS2 18 (SUTURE) ×3 IMPLANT
SUT STRATAFIX 0 PDS 27 VIOLET (SUTURE) ×6
SUT VIC AB 2-0 CT1 27 (SUTURE) ×6
SUT VIC AB 2-0 CT1 TAPERPNT 27 (SUTURE) ×2 IMPLANT
SUTURE STRATFX 0 PDS 27 VIOLET (SUTURE) ×1 IMPLANT
SYR 50ML LL SCALE MARK (SYRINGE) IMPLANT
TRAY FOLEY CATH 14FRSI W/METER (CATHETERS) ×2 IMPLANT
TRAY FOLEY W/METER SILVER 16FR (SET/KITS/TRAYS/PACK) ×1 IMPLANT
YANKAUER SUCT BULB TIP 10FT TU (MISCELLANEOUS) ×3 IMPLANT

## 2017-06-30 NOTE — Anesthesia Postprocedure Evaluation (Signed)
Anesthesia Post Note  Patient: Christina Zhang  Procedure(s) Performed: TOTAL LEFT HIP ARTHROPLASTY ANTERIOR APPROACH (Left Hip)     Patient location during evaluation: PACU Anesthesia Type: Spinal Level of consciousness: oriented and awake and alert Pain management: pain level controlled Vital Signs Assessment: post-procedure vital signs reviewed and stable Respiratory status: spontaneous breathing and respiratory function stable Cardiovascular status: blood pressure returned to baseline and stable Postop Assessment: no headache, no backache and no apparent nausea or vomiting Anesthetic complications: no    Last Vitals:  Vitals:   06/30/17 1145 06/30/17 1200  BP: (!) 105/56 119/61  Pulse: 80 82  Resp: 17 16  Temp: 36.6 C 36.7 C  SpO2: 99% 95%    Last Pain:  Vitals:   06/30/17 1200  TempSrc:   PainSc: [place]

## 2017-06-30 NOTE — Anesthesia Procedure Notes (Signed)
Spinal  Patient location during procedure: OR Start time: 06/30/2017 8:37 AM End time: 06/30/2017 8:42 AM Staffing Anesthesiologist: Lynda Rainwater, MD Performed: anesthesiologist  Preanesthetic Checklist Completed: patient identified, site marked, surgical consent, pre-op evaluation, timeout performed, IV checked, risks and benefits discussed and monitors and equipment checked Spinal Block Patient position: sitting Prep: Betadine Patient monitoring: heart rate, cardiac monitor, continuous pulse ox and blood pressure Approach: right paramedian Location: L3-4 Injection technique: single-shot Needle Needle type: [place]  Needle gauge: 22 G Needle length: 9 cm

## 2017-06-30 NOTE — Op Note (Signed)
OPERATIVE REPORT- TOTAL HIP ARTHROPLASTY   PREOPERATIVE DIAGNOSIS: Osteoarthritis of the Left hip.   POSTOPERATIVE DIAGNOSIS: Osteoarthritis of the Left  hip.   PROCEDURE: Left total hip arthroplasty, anterior approach.   SURGEON: Gaynelle Arabian, MD   ASSISTANT: Arlee Muslim, PA-C  ANESTHESIA:  Spinal  ESTIMATED BLOOD LOSS:-850 mL    DRAINS: Hemovac x1.   COMPLICATIONS: None   CONDITION: PACU - hemodynamically stable.   BRIEF CLINICAL NOTE: Christina Zhang is a 72 y.o. female who has advanced end-  stage arthritis of their Left  hip with progressively worsening pain and  dysfunction.The patient has failed nonoperative management and presents for  total hip arthroplasty.   PROCEDURE IN DETAIL: After successful administration of spinal  anesthetic, the traction boots for the [facility] bed were placed on both  feet and the patient was placed onto the [facility] bed, boots placed into the leg  holders. The Left hip was then isolated from the perineum with plastic  drapes and prepped and draped in the usual sterile fashion. ASIS and  greater trochanter were marked and a oblique incision was made, starting  at about 1 cm lateral and 2 cm distal to the ASIS and coursing towards  the anterior cortex of the femur. The skin was cut with a 10 blade  through subcutaneous tissue to the level of the fascia overlying the  tensor fascia lata muscle. The fascia was then incised in line with the  incision at the junction of the anterior third and posterior 2/3rd. The  muscle was teased off the fascia and then the interval between the TFL  and the rectus was developed. The Hohmann retractor was then placed at  the top of the femoral neck over the capsule. The vessels overlying the  capsule were cauterized and the fat on top of the capsule was removed.  A Hohmann retractor was then placed anterior underneath the rectus  femoris to give exposure to the entire anterior capsule. A T-shaped   capsulotomy was performed. The edges were tagged and the femoral head  was identified.       Osteophytes are removed off the superior acetabulum.  The femoral neck was then cut in situ with an oscillating saw. Traction  was then applied to the left lower extremity utilizing the [facility]  traction. The femoral head was then removed. Retractors were placed  around the acetabulum and then circumferential removal of the labrum was  performed. Osteophytes were also removed. Reaming starts at 45 mm to  medialize and  Increased in 2 mm increments to 49 mm. We reamed in  approximately 40 degrees of abduction, 20 degrees anteversion. A 50 mm  pinnacle acetabular shell was then impacted in anatomic position under  fluoroscopic guidance with excellent purchase. We did not need to place  any additional dome screws. A 32 mm neutral + 4 marathon liner was then  placed into the acetabular shell.       The femoral lift was then placed along the lateral aspect of the femur  just distal to the vastus ridge. The leg was  externally rotated and capsule  was stripped off the inferior aspect of the femoral neck down to the  level of the lesser trochanter, this was done with electrocautery. The femur was lifted after this was performed. The  leg was then placed in an extended and adducted position essentially delivering the femur. We also removed the capsule superiorly and the piriformis from the piriformis  fossa to gain excellent exposure of the  proximal femur. Rongeur was used to remove some cancellous bone to get  into the lateral portion of the proximal femur for placement of the  initial starter reamer. The starter broaches was placed  the starter broach  and was shown to go down the center of the canal. Broaching  with the  Corail system was then performed starting at size 8, coursing  Up to size 12. A size 12 had excellent torsional and rotational  and axial stability. The trial standard offset neck was then  placed  with a 32 + 9 trial head. The hip was then reduced. We confirmed that  the stem was in the canal both on AP and lateral x-rays. It also has excellent sizing. The hip was reduced with outstanding stability through full extension and full external rotation.. AP pelvis was taken and the leg lengths were measured and found to be equal. Hip was then dislocated again and the femoral head and neck removed. The  femoral broach was removed. Size 12 Corail stem with a standard offset  neck was then impacted into the femur following native anteversion. Has  excellent purchase in the canal. Excellent torsional and rotational and  axial stability. It is confirmed to be in the canal on AP and lateral  fluoroscopic views. The 32 + 9 ceramic head was placed and the hip  reduced with outstanding stability. Again AP pelvis was taken and it  confirmed that the leg lengths were equal. The wound was then copiously  irrigated with saline solution and the capsule reattached and repaired  with Ethibond suture. 30 ml of .25% Bupivicaine was  injected into the capsule and into the edge of the tensor fascia lata as well as subcutaneous tissue. The fascia overlying the tensor fascia lata was then closed with a running #1 V-Loc. Subcu was closed with interrupted 2-0 Vicryl and subcuticular running 4-0 Monocryl. Incision was cleaned  and dried. Steri-Strips and a bulky sterile dressing applied. Hemovac  drain was hooked to suction and then the patient was awakened and transported to  recovery in stable condition.        Please note that a surgical assistant was a medical necessity for this procedure to perform it in a safe and expeditious manner. Assistant was necessary to provide appropriate retraction of vital neurovascular structures and to prevent femoral fracture and allow for anatomic placement of the prosthesis.  Gaynelle Arabian, M.D.

## 2017-06-30 NOTE — Evaluation (Signed)
Physical Therapy Evaluation Patient Details Name: Christina Zhang MRN: [id number] DOB: May 26, 1945 Today's Date: 06/30/2017   History of Present Illness  72 yo female s/p L THA 06/30/17. Hx of R TKA 07/2015  Clinical Impression  On eval POD 0, pt required Min assist for mobility. She walked ~125 feet with a RW. Pain rated 7/10 with activity. Anticipate pt will progress well during hospital stay. Plan is for possible d/c home on tomorrow.     Follow Up Recommendations No PT follow up    Equipment Recommendations  None recommended by PT    Recommendations for Other Services       Precautions / Restrictions Precautions Precautions: Fall Restrictions Weight Bearing Restrictions: No LLE Weight Bearing: Weight bearing as tolerated      Mobility  Bed Mobility Overal bed mobility: Needs Assistance Bed Mobility: Supine to Sit;Sit to Supine     Supine to sit: Min assist;HOB elevated Sit to supine: Min assist;HOB elevated   General bed mobility comments: Assist for L LE off/onto bed. Increased time.   Transfers Overall transfer level: Needs assistance Equipment used: Rolling walker (2 wheeled) Transfers: Sit to/from Stand Sit to Stand: Min guard;From elevated surface         General transfer comment: close guard for safety. VCs safety, hand placement.   Ambulation/Gait Ambulation/Gait assistance: Min guard Ambulation Distance (Feet): 125 Feet Assistive device: Rolling walker (2 wheeled) Gait Pattern/deviations: Step-to pattern;Step-through pattern;Decreased stride length     General Gait Details: close guard for safety. VCs safety, sequence. Slow gait speed. Several brief standing rest breaks taken as pt needed.   Stairs            Wheelchair Mobility    Modified Rankin (Stroke Patients Only)       Balance                                             Pertinent Vitals/Pain Pain Assessment: 0-10 Pain Score: 7  Pain Location: L  hip/thigh Pain Descriptors / Indicators: Sore Pain Intervention(s): Monitored during session;Repositioned;Ice applied    Home Living Family/patient expects to be discharged to:: Private residence Living Arrangements: Alone Available Help at Discharge: Family;Available PRN/intermittently Type of Home: House Home Access: Stairs to enter   Entrance Stairs-Number of Steps: 3 Home Layout: One level Home Equipment: Walker - 2 wheels;Bedside commode;Cane - single point      Prior Function Level of Independence: Independent               Hand Dominance        Extremity/Trunk Assessment   Upper Extremity Assessment Upper Extremity Assessment: Overall WFL for tasks assessed    Lower Extremity Assessment Lower Extremity Assessment: Generalized weakness(s/p L THA)    Cervical / Trunk Assessment Cervical / Trunk Assessment: Normal  Communication   Communication: No difficulties  Cognition Arousal/Alertness: Awake/alert Behavior During Therapy: WFL for tasks assessed/performed Overall Cognitive Status: Within Functional Limits for tasks assessed                                        General Comments      Exercises     Assessment/Plan    PT Assessment Patient needs continued PT services  PT Problem List Decreased strength;Decreased range of motion;Decreased balance;Decreased  mobility;Decreased activity tolerance;Pain;Decreased knowledge of use of DME       PT Treatment Interventions DME instruction;Gait training;Functional mobility training;Therapeutic activities;Balance training;Patient/family education;Therapeutic exercise;Stair training    PT Goals (Current goals can be found in the Care Plan section)  Acute Rehab PT Goals Patient Stated Goal: regain PLOF PT Goal Formulation: With patient Time For Goal Achievement: 07/14/17 Potential to Achieve Goals: Good    Frequency 7X/week   Barriers to discharge        Co-evaluation                AM-PAC PT "6 Clicks" Daily Activity  Outcome Measure Difficulty turning over in bed (including adjusting bedclothes, sheets and blankets)?: A Lot Difficulty moving from lying on back to sitting on the side of the bed? : Unable Difficulty sitting down on and standing up from a chair with arms (e.g., wheelchair, bedside commode, etc,.)?: Unable Help needed moving to and from a bed to chair (including a wheelchair)?: A Little Help needed walking in hospital room?: A Little Help needed climbing 3-5 steps with a railing? : A Little 6 Click Score: 13    End of Session Equipment Utilized During Treatment: Gait belt Activity Tolerance: Patient tolerated treatment well Patient left: in bed;with call bell/phone within reach;with family/visitor present   PT Visit Diagnosis: Pain;Difficulty in walking, not elsewhere classified (R26.2) Pain - Right/Left: Left Pain - part of body: Hip    Time: 1536-1601 PT Time Calculation (min) (ACUTE ONLY): 25 min   Charges:   PT Evaluation $PT Eval Low Complexity: 1 Low PT Treatments $Gait Training: 8-22 mins   PT G Codes:        Weston Anna, MPT Pager: [phone]

## 2017-06-30 NOTE — Anesthesia Preprocedure Evaluation (Signed)
Anesthesia Evaluation  Patient identified by MRN, date of birth, ID band Patient awake    Reviewed: Allergy & Precautions, H&P , NPO status , Patient's Chart, lab work & pertinent test results  History of Anesthesia Complications (+) PONV, AWARENESS UNDER ANESTHESIA and history of anesthetic complications  Airway Mallampati: II  TM Distance: >3 FB Neck ROM: Full    Dental  (+) Teeth Intact, Dental Advisory Given, Caps   Pulmonary neg pulmonary ROS,    Pulmonary exam normal breath sounds clear to auscultation       Cardiovascular hypertension, Pt. on medications Normal cardiovascular exam Rhythm:Regular Rate:Normal     Neuro/Psych PSYCHIATRIC DISORDERS Depression negative neurological ROS     GI/Hepatic negative GI ROS, Neg liver ROS,   Endo/Other  negative endocrine ROS  Renal/GU negative Renal ROS  negative genitourinary   Musculoskeletal negative musculoskeletal ROS (+) Arthritis , Osteoarthritis,    Abdominal   Peds  Hematology negative hematology ROS (+)   Anesthesia Other Findings   Reproductive/Obstetrics negative OB ROS                             Anesthesia Physical  Anesthesia Plan  ASA: II  Anesthesia Plan: Spinal   Post-op Pain Management:    Induction: Intravenous  PONV Risk Score and Plan: 3 and Ondansetron, Dexamethasone and Midazolam  Airway Management Planned: Simple Face Mask  Additional Equipment:   Intra-op Plan:   Post-operative Plan: Extubation in OR  Informed Consent: I have reviewed the patients History and Physical, chart, labs and discussed the procedure including the risks, benefits and alternatives for the proposed anesthesia with the patient or authorized representative who has indicated his/her understanding and acceptance.   Dental advisory given  Plan Discussed with: CRNA  Anesthesia Plan Comments:         Anesthesia Quick  Evaluation

## 2017-06-30 NOTE — Interval H&P Note (Signed)
History and Physical Interval Note:  06/30/2017 8:24 AM  Christina Zhang  has presented today for surgery, with the diagnosis of Osteoarthritis Left hip  The various methods of treatment have been discussed with the patient and family. After consideration of risks, benefits and other options for treatment, the patient has consented to  Procedure(s): TOTAL LEFT HIP ARTHROPLASTY ANTERIOR APPROACH (Left) as a surgical intervention .  The patient's history has been reviewed, patient examined, no change in status, stable for surgery.  I have reviewed the patient's chart and labs.  Questions were answered to the patient's satisfaction.     Pilar Plate Jru Pense

## 2017-06-30 NOTE — Transfer of Care (Signed)
Immediate Anesthesia Transfer of Care Note  Patient: Christina Zhang  Procedure(s) Performed: Procedure(s): TOTAL LEFT HIP ARTHROPLASTY ANTERIOR APPROACH (Left)  Patient Location: PACU  Anesthesia Type:Spinal  Level of Consciousness:  sedated, patient cooperative and responds to stimulation  Airway & Oxygen Therapy:Patient Spontanous Breathing and Patient connected to face mask oxgen  Post-op Assessment:  Report given to PACU RN and Post -op Vital signs reviewed and stable  Post vital signs:  Reviewed and stable  Last Vitals:  Vitals:   06/30/17 0637  BP: (!) 151/80  Pulse: 91  Resp: 16  Temp: 36.8 C  SpO2: 67%    Complications: No apparent anesthesia complications

## 2017-07-01 ENCOUNTER — Encounter (HOSPITAL_COMMUNITY): Payer: Self-pay | Admitting: Orthopedic Surgery

## 2017-07-01 LAB — BASIC METABOLIC PANEL
Anion gap: 9 (ref 5–15)
BUN: 13 mg/dL (ref 6–20)
CHLORIDE: 109 mmol/L (ref 101–111)
CO2: 23 mmol/L (ref 22–32)
CREATININE: 0.62 mg/dL (ref 0.44–1.00)
Calcium: 8.6 mg/dL — ABNORMAL LOW (ref 8.9–10.3)
GFR calc Af Amer: >60 mL/min (ref >60)
GFR calc non Af Amer: >60 mL/min (ref >60)
GLUCOSE: 126 mg/dL — AB (ref 65–99)
Potassium: 3.7 mmol/L (ref 3.5–5.1)
SODIUM: 141 mmol/L (ref 135–145)

## 2017-07-01 LAB — CBC
HEMATOCRIT: 28.1 % — AB (ref 36.0–46.0)
Hemoglobin: 9.4 g/dL — ABNORMAL LOW (ref 12.0–15.0)
MCH: 31 pg (ref 26.0–34.0)
MCHC: 33.5 g/dL (ref 30.0–36.0)
MCV: 92.7 fL (ref 78.0–100.0)
Platelets: 294 10*3/uL (ref 150–400)
RBC: 3.03 MIL/uL — ABNORMAL LOW (ref 3.87–5.11)
RDW: 12.6 % (ref 11.5–15.5)
WBC: 11.1 10*3/uL — ABNORMAL HIGH (ref 4.0–10.5)

## 2017-07-01 MED ORDER — METHOCARBAMOL 500 MG PO TABS: 500.0000 mg | ORAL_TABLET | Freq: Four times a day (QID) | ORAL | 0 refills | Status: DC | PRN

## 2017-07-01 MED ORDER — HYDROMORPHONE HCL 2 MG PO TABS: 2.0000 mg | ORAL_TABLET | ORAL | 0 refills | Status: DC | PRN

## 2017-07-01 MED ORDER — POLYSACCHARIDE IRON COMPLEX 150 MG PO CAPS
150.0000 mg | ORAL_CAPSULE | Freq: Every day | ORAL | Status: DC
  Administered 2017-07-01: 150 mg via ORAL
  Filled 2017-07-01: qty 1

## 2017-07-01 MED ORDER — RIVAROXABAN 10 MG PO TABS: 10.0000 mg | ORAL_TABLET | Freq: Every day | ORAL | 0 refills | Status: DC

## 2017-07-01 MED ORDER — POLYSACCHARIDE IRON COMPLEX 150 MG PO CAPS: 150.0000 mg | ORAL_CAPSULE | Freq: Every day | ORAL | 0 refills | Status: DC

## 2017-07-01 NOTE — Progress Notes (Signed)
   Subjective: 1 Day Post-Op Procedure(s) (LRB): TOTAL LEFT HIP ARTHROPLASTY ANTERIOR APPROACH (Left) Patient reports pain as mild.   Patient seen in rounds by Dr. Wynelle Link. Patient is well, but has had some minor complaints of pain in the hip, requiring pain medications We will resume therapy today.  She walked 125 feet day of surgery.  If they do well with therapy and meets all goals, then will allow home later this afternoon following therapy. Plan is to go Home after hospital stay.  Objective: Vital signs in last 24 hours: Temp:  [97.9 F (36.6 C)-98.9 F (37.2 C)] 98.5 F (36.9 C) (02/21 0454) Pulse Rate:  [70-89] 79 (02/21 0454) Resp:  [10-25] 15 (02/21 0454) BP: (105-119)/(51-61) 117/58 (02/21 0454) SpO2:  [95 %-100 %] 98 % (02/21 0454) FiO2 (%):  [89 %] 89 % (02/20 1035)  Intake/Output from previous day:  Intake/Output Summary (Last 24 hours) at 07/01/2017 0727 Last data filed at 07/01/2017 0500 Gross per 24 hour  Intake 5930 ml  Output 2360 ml  Net 3570 ml    Intake/Output this shift: No intake/output data recorded.  Labs: Recent Labs    07/01/17 0548  HGB 9.4*   Recent Labs    07/01/17 0548  WBC 11.1*  RBC 3.03*  HCT 28.1*  PLT 294   Recent Labs    07/01/17 0548  NA 141  K 3.7  CL 109  CO2 23  BUN 13  CREATININE 0.62  GLUCOSE 126*  CALCIUM 8.6*   No results for input(s): LABPT, INR in the last 72 hours.  EXAM General - Patient is Alert, Appropriate and Oriented Extremity - Neurovascular intact Sensation intact distally Intact pulses distally Dorsiflexion/Plantar flexion intact Dressing - dressing C/D/I Motor Function - intact, moving foot and toes well on exam.  Hemovac pulled without difficulty.  Past Medical History:  Diagnosis Date  . Depression   . Diverticulosis   . Hypertension   . OA (osteoarthritis)    left hip  . PONV (postoperative nausea and vomiting)   . Wears glasses     Assessment/Plan: 1 Day Post-Op Procedure(s)  (LRB): TOTAL LEFT HIP ARTHROPLASTY ANTERIOR APPROACH (Left) Principal Problem:   OA (osteoarthritis) of hip  Estimated body mass index is 34.38 kg/m as calculated from the following:   Height as of this encounter: 5\' 6"  (1.676 m).   Weight as of this encounter: 96.6 kg (213 lb). Up with therapy Discharge home with home health  DVT Prophylaxis - Xarelto Weight Bearing As Tolerated left Leg Hemovac Pulled Begin Therapy  If meets goals and able to go home: Up with therapy Discharge home with home health Diet - Cardiac diet Follow up - in 2 weeks Activity - WBAT Disposition - Home Condition Upon Discharge - pending therapy D/C Meds - See DC Summary DVT Prophylaxis - Xarelto  Arlee Muslim, PA-C Orthopaedic Surgery 07/01/2017, 7:27 AM

## 2017-07-01 NOTE — Progress Notes (Signed)
Spoke with patient and daughter at bedside. They plan to do HEP, patient declined [place] services. She has all of her DME. Daughter to assist at home. No further HH needs assessed. [phone]

## 2017-07-01 NOTE — Discharge Instructions (Signed)
Dr. Gaynelle Arabian Total Joint Specialist [facility] [address], [place], [place] [address] [phone]  ANTERIOR APPROACH TOTAL HIP REPLACEMENT POSTOPERATIVE DIRECTIONS   Hip Rehabilitation, Guidelines Following Surgery  The results of a hip operation are greatly improved after range of motion and muscle strengthening exercises. Follow all safety measures which are given to protect your hip. If any of these exercises cause increased pain or swelling in your joint, decrease the amount until you are comfortable again. Then slowly increase the exercises. Call your caregiver if you have problems or questions.   HOME CARE INSTRUCTIONS  Remove items at home which could result in a fall. This includes throw rugs or furniture in walking pathways.   ICE to the affected hip every three hours for 30 minutes at a time and then as needed for pain and swelling.  Continue to use ice on the hip for pain and swelling from surgery. You may notice swelling that will progress down to the foot and ankle.  This is normal after surgery.  Elevate the leg when you are not up walking on it.    Continue to use the breathing machine which will help keep your temperature down.  It is common for your temperature to cycle up and down following surgery, especially at night when you are not up moving around and exerting yourself.  The breathing machine keeps your lungs expanded and your temperature down.   DIET You may resume your previous home diet once your are discharged from the hospital.  DRESSING / WOUND CARE / SHOWERING You may shower 3 days after surgery, but keep the wounds dry during showering.  You may use an occlusive plastic wrap (Press'n Seal for example), NO SOAKING/SUBMERGING IN THE BATHTUB.  If the bandage gets wet, change with a clean dry gauze.  If the incision gets wet, pat the wound dry with a clean towel. You may start showering once you are discharged home but do not submerge  the incision under water. Just pat the incision dry and apply a dry gauze dressing on daily. Change the surgical dressing daily and reapply a dry dressing each time.  ACTIVITY Walk with your walker as instructed. Use walker as long as suggested by your caregivers. Avoid periods of inactivity such as sitting longer than an hour when not asleep. This helps prevent blood clots.  You may resume a sexual relationship in one month or when given the OK by your doctor.  You may return to work once you are cleared by your doctor.  Do not drive a car for 6 weeks or until released by you surgeon.  Do not drive while taking narcotics.  WEIGHT BEARING Weight bearing as tolerated with assist device (walker, cane, etc) as directed, use it as long as suggested by your surgeon or therapist, typically at least 4-6 weeks.  POSTOPERATIVE CONSTIPATION PROTOCOL Constipation - defined medically as fewer than three stools per week and severe constipation as less than one stool per week.  One of the most common issues patients have following surgery is constipation.  Even if you have a regular bowel pattern at home, your normal regimen is likely to be disrupted due to multiple reasons following surgery.  Combination of anesthesia, postoperative narcotics, change in appetite and fluid intake all can affect your bowels.  In order to avoid complications following surgery, here are some recommendations in order to help you during your recovery period.  Colace (docusate) - Pick up an over-the-counter  form of Colace or another stool softener and take twice a day as long as you are requiring postoperative pain medications.  Take with a full glass of water daily.  If you experience loose stools or diarrhea, hold the colace until you stool forms back up.  If your symptoms do not get better within 1 week or if they get worse, check with your doctor.  Dulcolax (bisacodyl) - Pick up over-the-counter and take as directed by the  product packaging as needed to assist with the movement of your bowels.  Take with a full glass of water.  Use this product as needed if not relieved by Colace only.   MiraLax (polyethylene glycol) - Pick up over-the-counter to have on hand.  MiraLax is a solution that will increase the amount of water in your bowels to assist with bowel movements.  Take as directed and can mix with a glass of water, juice, soda, coffee, or tea.  Take if you go more than two days without a movement. Do not use MiraLax more than once per day. Call your doctor if you are still constipated or irregular after using this medication for 7 days in a row.  If you continue to have problems with postoperative constipation, please contact the office for further assistance and recommendations.  If you experience "the worst abdominal pain ever" or develop nausea or vomiting, please contact the office immediatly for further recommendations for treatment.  ITCHING  If you experience itching with your medications, try taking only a single pain pill, or even half a pain pill at a time.  You can also use Benadryl over the counter for itching or also to help with sleep.   TED HOSE STOCKINGS Wear the elastic stockings on both legs for three weeks following surgery during the day but you may remove then at night for sleeping.  MEDICATIONS See your medication summary on the After Visit Summary that the nursing staff will review with you prior to discharge.  You may have some home medications which will be placed on hold until you complete the course of blood thinner medication.  It is important for you to complete the blood thinner medication as prescribed by your surgeon.  Continue your approved medications as instructed at time of discharge.  PRECAUTIONS If you experience chest pain or shortness of breath - call 911 immediately for transfer to the hospital emergency department.  If you develop a fever greater that 101 F, purulent  drainage from wound, increased redness or drainage from wound, foul odor from the wound/dressing, or calf pain - CONTACT YOUR SURGEON.                                                   FOLLOW-UP APPOINTMENTS Make sure you keep all of your appointments after your operation with your surgeon and caregivers. You should call the office at the above phone number and make an appointment for approximately two weeks after the date of your surgery or on the date instructed by your surgeon outlined in the "After Visit Summary".  RANGE OF MOTION AND STRENGTHENING EXERCISES  These exercises are designed to help you keep full movement of your hip joint. Follow your caregiver's or physical therapist's instructions. Perform all exercises about fifteen times, three times per day or as directed. Exercise both hips, even if you  have had only one joint replacement. These exercises can be done on a training (exercise) mat, on the floor, on a table or on a bed. Use whatever works the best and is most comfortable for you. Use music or television while you are exercising so that the exercises are a pleasant break in your day. This will make your life better with the exercises acting as a break in routine you can look forward to.  Lying on your back, slowly slide your foot toward your buttocks, raising your knee up off the floor. Then slowly slide your foot back down until your leg is straight again.  Lying on your back spread your legs as far apart as you can without causing discomfort.  Lying on your side, raise your upper leg and foot straight up from the floor as far as is comfortable. Slowly lower the leg and repeat.  Lying on your back, tighten up the muscle in the front of your thigh (quadriceps muscles). You can do this by keeping your leg straight and trying to raise your heel off the floor. This helps strengthen the largest muscle supporting your knee.  Lying on your back, tighten up the muscles of your buttocks both  with the legs straight and with the knee bent at a comfortable angle while keeping your heel on the floor.   IF YOU ARE TRANSFERRED TO A SKILLED REHAB FACILITY If the patient is transferred to a skilled rehab facility following release from the hospital, a list of the current medications will be sent to the facility for the patient to continue.  When discharged from the skilled rehab facility, please have the facility set up the patient's [place] prior to being released. Also, the skilled facility will be responsible for providing the patient with their medications at time of release from the facility to include their pain medication, the muscle relaxants, and their blood thinner medication. If the patient is still at the rehab facility at time of the two week follow up appointment, the skilled rehab facility will also need to assist the patient in arranging follow up appointment in our office and any transportation needs.  MAKE SURE YOU:  Understand these instructions.  Get help right away if you are not doing well or get worse.    Pick up stool softner and laxative for home use following surgery while on pain medications. Do not submerge incision under water. Please use good hand washing techniques while changing dressing each day. May shower starting three days after surgery. Please use a clean towel to pat the incision dry following showers. Continue to use ice for pain and swelling after surgery. Do not use any lotions or creams on the incision until instructed by your surgeon.  Take Xarelto for two and a half more weeks following discharge from the hospital, then discontinue Xarelto. Once the patient has completed the blood thinner regimen, then take a Baby 81 mg Aspirin daily for three more weeks.    Information on my medicine - XARELTO (Rivaroxaban)   Why was Xarelto prescribed for you? Xarelto was prescribed for you to reduce the risk of blood clots forming  after orthopedic surgery. The medical term for these abnormal blood clots is venous thromboembolism (VTE).  What do you need to know about xarelto ? Take your Xarelto ONCE DAILY at the same time every day. You may take it either with or without food.  If you have difficulty swallowing the tablet whole, you may  crush it and mix in applesauce just prior to taking your dose.  Take Xarelto exactly as prescribed by your doctor and DO NOT stop taking Xarelto without talking to the doctor who prescribed the medication.  Stopping without other VTE prevention medication to take the place of Xarelto may increase your risk of developing a clot.  After discharge, you should have regular check-up appointments with your healthcare provider that is prescribing your Xarelto.    What do you do if you miss a dose? If you miss a dose, take it as soon as you remember on the same day then continue your regularly scheduled once daily regimen the next day. Do not take two doses of Xarelto on the same day.   Important Safety Information A possible side effect of Xarelto is bleeding. You should call your healthcare provider right away if you experience any of the following: ? Bleeding from an injury or your nose that does not stop. ? Unusual colored urine (red or dark brown) or unusual colored stools (red or black). ? Unusual bruising for unknown reasons. ? A serious fall or if you hit your head (even if there is no bleeding).  Some medicines may interact with Xarelto and might increase your risk of bleeding while on Xarelto. To help avoid this, consult your healthcare provider or pharmacist prior to using any new prescription or non-prescription medications, including herbals, vitamins, non-steroidal anti-inflammatory drugs (NSAIDs) and supplements.  This website has more information on Xarelto: [email or web address]

## 2017-07-01 NOTE — Progress Notes (Signed)
Physical Therapy Treatment Patient Details Name: Christina Zhang MRN: [id number] DOB: Jan 07, 1946 Today's Date: 07/01/2017    History of Present Illness 72 yo female s/p L THA 06/30/17. Hx of R TKA 07/2015    PT Comments    POD # 1 am session Assisted with amb in hallway, practiced stairs then returned to room to perform some TE's followed by ICE.    Follow Up Recommendations  No PT follow up(HEP)     Equipment Recommendations  None recommended by PT    Recommendations for Other Services       Precautions / Restrictions Precautions Precautions: Fall Restrictions Weight Bearing Restrictions: No LLE Weight Bearing: Weight bearing as tolerated    Mobility  Bed Mobility               General bed mobility comments: OOB in recliner  Transfers Overall transfer level: Needs assistance Equipment used: Rolling walker (2 wheeled) Transfers: Sit to/from Stand Sit to Stand: Min guard;From elevated surface;Supervision         General transfer comment: <25% VC's on safety with turns  Ambulation/Gait Ambulation/Gait assistance: Supervision;Min guard Ambulation Distance (Feet): 135 Feet Assistive device: Rolling walker (2 wheeled) Gait Pattern/deviations: Step-to pattern;Step-through pattern;Decreased stride length Gait velocity: decreased   General Gait Details: tolerated an increased distance   Stairs Stairs: Yes   Stair Management: No rails;Step to pattern;Backwards;With walker Number of Stairs: 2 General stair comments: with 50% VC's on proper tech and walker placement  Wheelchair Mobility    Modified Rankin (Stroke Patients Only)       Balance                                            Cognition Arousal/Alertness: Awake/alert Behavior During Therapy: WFL for tasks assessed/performed Overall Cognitive Status: Within Functional Limits for tasks assessed                                        Exercises   Total  Hip Replacement TE's 10 reps ankle pumps 10 reps knee presses 10 reps heel slides 10 reps SAQ's 10 reps ABD Followed by ICE     General Comments        Pertinent Vitals/Pain Pain Assessment: 0-10 Pain Score: 5  Pain Location: L hip/thigh Pain Descriptors / Indicators: Sore;Operative site guarding Pain Intervention(s): Monitored during session;Repositioned;Ice applied    Home Living                      Prior Function            PT Goals (current goals can now be found in the care plan section) Progress towards PT goals: Progressing toward goals    Frequency    7X/week      PT Plan Current plan remains appropriate    Co-evaluation              AM-PAC PT "6 Clicks" Daily Activity  Outcome Measure  Difficulty turning over in bed (including adjusting bedclothes, sheets and blankets)?: A Lot Difficulty moving from lying on back to sitting on the side of the bed? : Unable Difficulty sitting down on and standing up from a chair with arms (e.g., wheelchair, bedside commode, etc,.)?: Unable Help needed moving to and from a  bed to chair (including a wheelchair)?: A Little Help needed walking in hospital room?: A Little Help needed climbing 3-5 steps with a railing? : A Little 6 Click Score: 13    End of Session Equipment Utilized During Treatment: Gait belt Activity Tolerance: Patient tolerated treatment well Patient left: in chair;with chair alarm set;with nursing/sitter in room;with call bell/phone within reach   PT Visit Diagnosis: Pain;Difficulty in walking, not elsewhere classified (R26.2) Pain - Right/Left: Left Pain - part of body: Hip     Time: 1040-1109 PT Time Calculation (min) (ACUTE ONLY): 29 min  Charges:  $Gait Training: 8-22 mins $Therapeutic Exercise: 8-22 mins                    G Codes:       Rica Koyanagi  PTA [facility]  Acute  Rehab Pager      [phone]

## 2017-07-01 NOTE — Discharge Summary (Signed)
Physician Discharge Summary   Patient ID: Christina Zhang MRN: [id number] DOB/AGE: 1945-06-17 72 y.o.  Admit date: 06/30/2017 Discharge date: 07-01-2017  Primary Diagnosis:  Osteoarthritis of the Left  hip.    Admission Diagnoses:  Past Medical History:  Diagnosis Date  . Depression   . Diverticulosis   . Hypertension   . OA (osteoarthritis)    left hip  . PONV (postoperative nausea and vomiting)   . Wears glasses    Discharge Diagnoses:   Principal Problem:   OA (osteoarthritis) of hip  Estimated body mass index is 34.38 kg/m as calculated from the following:   Height as of this encounter: _0  (1.676 m).   Weight as of this encounter: 96.6 kg (213 lb).  Procedure(s) (LRB): TOTAL LEFT HIP ARTHROPLASTY ANTERIOR APPROACH (Left)   Consults: None  HPI: Christina Zhang is a 72 y.o. female who has advanced end-  stage arthritis of their Left  hip with progressively worsening pain and  dysfunction.The patient has failed nonoperative management and presents for  total hip arthroplasty.    Laboratory Data: Admission on 06/30/2017  Component Date Value Ref Range Status  . WBC 07/01/2017 11.1* 4.0 - 10.5 K/uL Final  . RBC 07/01/2017 3.03* 3.87 - 5.11 MIL/uL Final  . Hemoglobin 07/01/2017 9.4* 12.0 - 15.0 g/dL Final  . HCT 07/01/2017 28.1* 36.0 - 46.0 % Final  . MCV 07/01/2017 92.7  78.0 - 100.0 fL Final  . MCH 07/01/2017 31.0  26.0 - 34.0 pg Final  . MCHC 07/01/2017 33.5  30.0 - 36.0 g/dL Final  . RDW 07/01/2017 12.6  11.5 - 15.5 % Final  . Platelets 07/01/2017 294  150 - 400 K/uL Final   Performed at [facility], [place] [address], [place], [place] [address]  . Sodium 07/01/2017 141  135 - 145 mmol/L Final  . Potassium 07/01/2017 3.7  3.5 - 5.1 mmol/L Final  . Chloride 07/01/2017 109  101 - 111 mmol/L Final  . CO2 07/01/2017 23  22 - 32 mmol/L Final  . Glucose, Bld 07/01/2017 126* 65 - 99 mg/dL Final  . BUN 07/01/2017 13  6 - 20 mg/dL Final  .  Creatinine, Ser 07/01/2017 0.62  0.44 - 1.00 mg/dL Final  . Calcium 07/01/2017 8.6* 8.9 - 10.3 mg/dL Final  . GFR calc non Af Amer 07/01/2017 >60  >60 mL/min Final  . GFR calc Af Amer 07/01/2017 >60  >60 mL/min Final   Comment: (NOTE) The eGFR has been calculated using the CKD EPI equation. This calculation has not been validated in all clinical situations. eGFR's persistently <60 mL/min signify possible Chronic Kidney Disease.   Georgiann Hahn gap 07/01/2017 9  5 - 15 Final   Performed at [facility], [place] [address], [place], [place] [address]  Hospital Outpatient Visit on 06/24/2017  Component Date Value Ref Range Status  . aPTT 06/24/2017 30  24 - 36 seconds Final   Performed at [facility], [place] [address], [place], [place] [address]  . WBC 06/24/2017 6.4  4.0 - 10.5 K/uL Final  . RBC 06/24/2017 4.53  3.87 - 5.11 MIL/uL Final  . Hemoglobin 06/24/2017 13.9  12.0 - 15.0 g/dL Final  . HCT 06/24/2017 41.6  36.0 - 46.0 % Final  . MCV 06/24/2017 91.8  78.0 - 100.0 fL Final  . MCH 06/24/2017 30.7  26.0 - 34.0 pg Final  . MCHC 06/24/2017 33.4  30.0 - 36.0 g/dL Final  .  RDW 06/24/2017 12.5  11.5 - 15.5 % Final  . Platelets 06/24/2017 401* 150 - 400 K/uL Final   Performed at [facility], [place] [address], [place], [place] [address]  . Sodium 06/24/2017 142  135 - 145 mmol/L Final  . Potassium 06/24/2017 4.3  3.5 - 5.1 mmol/L Final  . Chloride 06/24/2017 103  101 - 111 mmol/L Final  . CO2 06/24/2017 26  22 - 32 mmol/L Final  . Glucose, Bld 06/24/2017 100* 65 - 99 mg/dL Final  . BUN 06/24/2017 14  6 - 20 mg/dL Final  . Creatinine, Ser 06/24/2017 0.74  0.44 - 1.00 mg/dL Final  . Calcium 06/24/2017 9.9  8.9 - 10.3 mg/dL Final  . Total Protein 06/24/2017 8.0  6.5 - 8.1 g/dL Final  . Albumin 06/24/2017 4.4  3.5 - 5.0 g/dL Final  . AST 06/24/2017 26  15 - 41 U/L Final  . ALT 06/24/2017 25  14 - 54 U/L Final  . Alkaline Phosphatase 06/24/2017  117  38 - 126 U/L Final  . Total Bilirubin 06/24/2017 0.7  0.3 - 1.2 mg/dL Final  . GFR calc non Af Amer 06/24/2017 >60  >60 mL/min Final  . GFR calc Af Amer 06/24/2017 >60  >60 mL/min Final   Comment: (NOTE) The eGFR has been calculated using the CKD EPI equation. This calculation has not been validated in all clinical situations. eGFR's persistently <60 mL/min signify possible Chronic Kidney Disease.   Georgiann Hahn gap 06/24/2017 13  5 - 15 Final   Performed at [facility], [place] [address] [place], [place] [address]  . Prothrombin Time 06/24/2017 12.8  11.4 - 15.2 seconds Final  . INR 06/24/2017 0.97   Final   Performed at [place] [address] [place], [place] [address]  . ABO/RH(D) 06/24/2017 A POS   Final  . Antibody Screen 06/24/2017 NEG   Final  . Sample Expiration 06/24/2017 07/03/2017   Final  . Extend sample reason 06/24/2017    Final                   Value:NO TRANSFUSIONS OR PREGNANCY IN THE PAST 3 MONTHS Performed at [facility], [place] [address] [place], [place] [address]   . MRSA, PCR 06/24/2017 NEGATIVE  NEGATIVE Final  . Staphylococcus aureus 06/24/2017 NEGATIVE  NEGATIVE Final   Comment: (NOTE) The Xpert SA Assay (FDA approved for NASAL specimens in patients 18 years of age and older), is one component of a comprehensive surveillance program. It is not intended to diagnose infection nor to guide or monitor treatment. Performed at [facility], [place] [address] [place],  [address]      X-Rays:Dg Pelvis Portable  Result Date: 06/30/2017 CLINICAL DATA:  Left hip replacement EXAM: PORTABLE PELVIS 1-2 VIEWS COMPARISON:  None. FINDINGS: Interval left total hip arthroplasty without failure or complication. No acute fracture or dislocation. Postsurgical changes in the surrounding soft tissues with a surgical drain in place. Severe superior right hip joint space narrowing with marginal  osteophytes, subchondral sclerosis and subchondral cystic changes consistent with advanced osteoarthritis. IMPRESSION: 1. Interval left total hip arthroplasty without hardware failure or complication. 2. Severe osteoarthritis of the right hip. Electronically Signed   By: Kathreen Devoid   On: 06/30/2017 11:02   Dg C-arm 1-60 Min-no Report  Result Date: 06/30/2017 Fluoroscopy was utilized by the requesting physician.  No radiographic interpretation.    EKG: Orders placed or  performed in visit on 07/12/15  . EKG 12-Lead     Hospital Course: Patient was admitted to [facility] and taken to the OR and underwent the above state procedure without complications.  Patient tolerated the procedure well and was later transferred to the recovery room and then to the orthopaedic floor for postoperative care.  They were given PO and IV analgesics for pain control following their surgery.  They were given 24 hours of postoperative antibiotics of  Anti-infectives (From admission, onward)   Start     Dose/Rate Route Frequency Ordered Stop   06/30/17 1500  ceFAZolin (ANCEF) IVPB 2g/100 mL premix     2 g 200 mL/hr over 30 Minutes Intravenous Every 6 hours 06/30/17 1156 06/30/17 2233   06/30/17 0619  ceFAZolin (ANCEF) IVPB 2g/100 mL premix     2 g 200 mL/hr over 30 Minutes Intravenous On call to O.R. 06/30/17 9735 06/30/17 0848     and started on DVT prophylaxis in the form of Xarelto.   PT and OT were ordered for total hip protocol.  The patient was allowed to be WBAT with therapy. Discharge planning was consulted to help with postop disposition and equipment needs.  Patient had a good night on the evening of surgery.  They started to get up OOB with therapy following surgery and again on day one.  Hemovac drain was pulled without difficulty.   Dressing was checked and looked good.   Patient was seen in rounds and was ready to go home.  Discharge home with home health Diet - Cardiac diet Follow up - in  2 weeks Activity - WBAT Disposition - Home Condition Upon Discharge - stable D/C Meds - See DC Summary DVT Prophylaxis - Xarelto     Discharge Instructions    Call MD / Call 911   Complete by:  As directed    If you experience chest pain or shortness of breath, CALL 911 and be transported to the hospital emergency room.  If you develope a fever above 101 F, pus (white drainage) or increased drainage or redness at the wound, or calf pain, call your surgeon's office.   Change dressing   Complete by:  As directed    You may change your dressing dressing daily with sterile 4 x 4 inch gauze dressing and paper tape.  Do not submerge the incision under water.   Constipation Prevention   Complete by:  As directed    Drink plenty of fluids.  Prune juice may be helpful.  You may use a stool softener, such as Colace (over the counter) 100 mg twice a day.  Use MiraLax (over the counter) for constipation as needed.   Diet - low sodium heart healthy   Complete by:  As directed    Discharge instructions   Complete by:  As directed    Take Xarelto for two and a half more weeks, then discontinue Xarelto. Once the patient has completed the blood thinner regimen, then take a Baby 81 mg Aspirin daily for three more weeks.   Pick up stool softner and laxative for home use following surgery while on pain medications. Do not submerge incision under water. Please use good hand washing techniques while changing dressing each day. May shower starting three days after surgery. Please use a clean towel to pat the incision dry following showers. Continue to use ice for pain and swelling after surgery. Do not use any lotions or creams on the incision until instructed  by your surgeon.  Wear both TED hose on both legs during the day every day for three weeks, but may remove the TED hose at night at home.  Postoperative Constipation Protocol  Constipation - defined medically as fewer than three stools per  week and severe constipation as less than one stool per week.  One of the most common issues patients have following surgery is constipation.  Even if you have a regular bowel pattern at home, your normal regimen is likely to be disrupted due to multiple reasons following surgery.  Combination of anesthesia, postoperative narcotics, change in appetite and fluid intake all can affect your bowels.  In order to avoid complications following surgery, here are some recommendations in order to help you during your recovery period.  Colace (docusate) - Pick up an over-the-counter form of Colace or another stool softener and take twice a day as long as you are requiring postoperative pain medications.  Take with a full glass of water daily.  If you experience loose stools or diarrhea, hold the colace until you stool forms back up.  If your symptoms do not get better within 1 week or if they get worse, check with your doctor.  Dulcolax (bisacodyl) - Pick up over-the-counter and take as directed by the product packaging as needed to assist with the movement of your bowels.  Take with a full glass of water.  Use this product as needed if not relieved by Colace only.   MiraLax (polyethylene glycol) - Pick up over-the-counter to have on hand.  MiraLax is a solution that will increase the amount of water in your bowels to assist with bowel movements.  Take as directed and can mix with a glass of water, juice, soda, coffee, or tea.  Take if you go more than two days without a movement. Do not use MiraLax more than once per day. Call your doctor if you are still constipated or irregular after using this medication for 7 days in a row.  If you continue to have problems with postoperative constipation, please contact the office for further assistance and recommendations.  If you experience "the worst abdominal pain ever" or develop nausea or vomiting, please contact the office immediatly for further recommendations for  treatment.   Do not sit on low chairs, stoools or toilet seats, as it may be difficult to get up from low surfaces   Complete by:  As directed    Driving restrictions   Complete by:  As directed    No driving until released by the physician.   Increase activity slowly as tolerated   Complete by:  As directed    Lifting restrictions   Complete by:  As directed    No lifting until released by the physician.   Patient may shower   Complete by:  As directed    You may shower without a dressing once there is no drainage.  Do not wash over the wound.  If drainage remains, do not shower until drainage stops.   TED hose   Complete by:  As directed    Use stockings (TED hose) for 3 weeks on both leg(s).  You may remove them at night for sleeping.   Weight bearing as tolerated   Complete by:  As directed    Laterality:  left   Extremity:  Lower     Allergies as of 07/01/2017      Reactions   Hydrocodone Nausea And Vomiting   vicodin  Metronidazole Swelling   lips swelled   Oxycodone Other (See Comments)   "MADE MY HEART RACE"   Tramadol Nausea And Vomiting   Ciprofloxacin Rash      Medication List    STOP taking these medications   cholecalciferol 1000 units tablet Commonly known as:  VITAMIN D   ibuprofen 200 MG tablet Commonly known as:  ADVIL,MOTRIN     TAKE these medications   acetaminophen 500 MG tablet Commonly known as:  TYLENOL Take 1,000 mg by mouth daily as needed for moderate pain or headache.   hydrochlorothiazide 25 MG tablet Commonly known as:  HYDRODIURIL Take 12.5 mg by mouth every morning.   HYDROmorphone 2 MG tablet Commonly known as:  DILAUDID Take 1 tablet (2 mg total) by mouth every 4 (four) hours as needed for moderate pain.   iron polysaccharides 150 MG capsule Commonly known as:  NIFEREX Take 1 capsule (150 mg total) by mouth daily. Take for three weeks and then discontinue.   methocarbamol 500 MG tablet Commonly known as:  ROBAXIN Take 1  tablet (500 mg total) by mouth every 6 (six) hours as needed for muscle spasms.   rivaroxaban 10 MG Tabs tablet Commonly known as:  XARELTO Take 1 tablet (10 mg total) by mouth daily with breakfast. Take Xarelto for two and a half more weeks following discharge from the hospital, then discontinue Xarelto. Once the patient has completed the blood thinner regimen, then take a Baby 81 mg Aspirin daily for three more weeks.   sertraline 100 MG tablet Commonly known as:  ZOLOFT Take 100 mg by mouth daily.            Discharge Care Instructions  (From admission, onward)        Start     Ordered   07/01/17 0000  Weight bearing as tolerated    Question Answer Comment  Laterality left   Extremity Lower      07/01/17 0733   07/01/17 0000  Change dressing    Comments:  You may change your dressing dressing daily with sterile 4 x 4 inch gauze dressing and paper tape.  Do not submerge the incision under water.   07/01/17 1610     Follow-up Information    Gaynelle Arabian, MD. Schedule an appointment as soon as possible for a visit on 07/13/2017.   Specialty:  Orthopedic Surgery Contact information: [address] [place] [address] [phone]           Signed: Arlee Muslim, PA-C Orthopaedic Surgery 07/01/2017, 7:34 AM

## 2017-07-01 NOTE — Progress Notes (Signed)
Physical Therapy Treatment Patient Details Name: Christina Zhang MRN: [id number] DOB: [birth year] Today's Date: 07/01/2017    History of Present Illness 72 yo female s/p L THA 06/30/17. Hx of R TKA 07/2015    PT Comments    POD # 1 pm session Assisted pt to bathroom to void.  Amb in hallway and practiced stairs again with family member .  Pt has met goals to D/C to home.    Follow Up Recommendations  No PT follow up(HEP)     Equipment Recommendations  None recommended by PT    Recommendations for Other Services       Precautions / Restrictions Precautions Precautions: Fall Restrictions Weight Bearing Restrictions: No LLE Weight Bearing: Weight bearing as tolerated    Mobility  Bed Mobility               General bed mobility comments: OOB in recliner  Transfers Overall transfer level: Needs assistance Equipment used: Rolling walker (2 wheeled) Transfers: Sit to/from Stand Sit to Stand: Min guard;From elevated surface;Supervision         General transfer comment: <25% VC's on safety with turns  Ambulation/Gait Ambulation/Gait assistance: Supervision;Min guard Ambulation Distance (Feet): 135 Feet Assistive device: Rolling walker (2 wheeled) Gait Pattern/deviations: Step-to pattern;Step-through pattern;Decreased stride length Gait velocity: decreased   General Gait Details: tolerated an increased distance   Stairs Stairs: Yes   Stair Management: No rails;Step to pattern;Backwards;With walker Number of Stairs: 2 General stair comments: with 50% VC's on proper tech and walker placement  repeat stairs with family member who is taking pt home  Wheelchair Mobility    Modified Rankin (Stroke Patients Only)       Balance                                            Cognition Arousal/Alertness: Awake/alert Behavior During Therapy: WFL for tasks assessed/performed Overall Cognitive Status: Within Functional Limits for tasks assessed                                        Exercises      General Comments        Pertinent Vitals/Pain Pain Assessment: 0-10 Pain Score: 5  Pain Location: L hip/thigh Pain Descriptors / Indicators: Sore;Operative site guarding Pain Intervention(s): Monitored during session;Repositioned;Ice applied    Home Living                      Prior Function            PT Goals (current goals can now be found in the care plan section) Progress towards PT goals: Progressing toward goals    Frequency    7X/week      PT Plan Current plan remains appropriate    Co-evaluation              AM-PAC PT "6 Clicks" Daily Activity  Outcome Measure  Difficulty turning over in bed (including adjusting bedclothes, sheets and blankets)?: A Lot Difficulty moving from lying on back to sitting on the side of the bed? : Unable Difficulty sitting down on and standing up from a chair with arms (e.g., wheelchair, bedside commode, etc,.)?: Unable Help needed moving to and from a bed to chair (including a wheelchair)?: A  Little Help needed walking in hospital room?: A Little Help needed climbing 3-5 steps with a railing? : A Little 6 Click Score: 13    End of Session Equipment Utilized During Treatment: Gait belt Activity Tolerance: Patient tolerated treatment well Patient left: in chair;with chair alarm set;with nursing/sitter in room;with call bell/phone within reach   PT Visit Diagnosis: Pain;Difficulty in walking, not elsewhere classified (R26.2) Pain - Right/Left: Left Pain - part of body: Hip     Time: 2761-4709 PT Time Calculation (min) (ACUTE ONLY): 23 min  Charges:  $Gait Training: 8-22 mins $Therapeutic Activity: 8-22 mins                    G Codes:     Rica Koyanagi  PTA [facility]  Acute  Rehab Pager      [phone]

## 2017-07-09 DIAGNOSIS — D649 Anemia, unspecified: Secondary | ICD-10-CM | POA: Diagnosis not present

## 2017-07-09 DIAGNOSIS — D529 Folate deficiency anemia, unspecified: Secondary | ICD-10-CM | POA: Diagnosis not present

## 2017-07-09 DIAGNOSIS — D519 Vitamin B12 deficiency anemia, unspecified: Secondary | ICD-10-CM | POA: Diagnosis not present

## 2017-08-03 DIAGNOSIS — Z96642 Presence of left artificial hip joint: Secondary | ICD-10-CM | POA: Diagnosis not present

## 2017-08-03 DIAGNOSIS — Z471 Aftercare following joint replacement surgery: Secondary | ICD-10-CM | POA: Diagnosis not present

## 2017-09-09 DIAGNOSIS — D529 Folate deficiency anemia, unspecified: Secondary | ICD-10-CM | POA: Diagnosis not present

## 2017-09-09 DIAGNOSIS — D519 Vitamin B12 deficiency anemia, unspecified: Secondary | ICD-10-CM | POA: Diagnosis not present

## 2017-09-09 DIAGNOSIS — E785 Hyperlipidemia, unspecified: Secondary | ICD-10-CM | POA: Diagnosis not present

## 2017-09-09 DIAGNOSIS — I10 Essential (primary) hypertension: Secondary | ICD-10-CM | POA: Diagnosis not present

## 2017-09-09 DIAGNOSIS — D649 Anemia, unspecified: Secondary | ICD-10-CM | POA: Diagnosis not present

## 2017-09-09 DIAGNOSIS — Z9189 Other specified personal risk factors, not elsewhere classified: Secondary | ICD-10-CM | POA: Diagnosis not present

## 2017-09-13 DIAGNOSIS — J301 Allergic rhinitis due to pollen: Secondary | ICD-10-CM | POA: Diagnosis not present

## 2017-09-13 DIAGNOSIS — E785 Hyperlipidemia, unspecified: Secondary | ICD-10-CM | POA: Diagnosis not present

## 2017-09-13 DIAGNOSIS — E6609 Other obesity due to excess calories: Secondary | ICD-10-CM | POA: Diagnosis not present

## 2017-09-13 DIAGNOSIS — F331 Major depressive disorder, recurrent, moderate: Secondary | ICD-10-CM | POA: Diagnosis not present

## 2017-11-08 DIAGNOSIS — D649 Anemia, unspecified: Secondary | ICD-10-CM | POA: Diagnosis not present

## 2017-11-08 DIAGNOSIS — D519 Vitamin B12 deficiency anemia, unspecified: Secondary | ICD-10-CM | POA: Diagnosis not present

## 2017-11-08 DIAGNOSIS — Z9189 Other specified personal risk factors, not elsewhere classified: Secondary | ICD-10-CM | POA: Diagnosis not present

## 2017-11-08 DIAGNOSIS — E785 Hyperlipidemia, unspecified: Secondary | ICD-10-CM | POA: Diagnosis not present

## 2017-11-08 DIAGNOSIS — I10 Essential (primary) hypertension: Secondary | ICD-10-CM | POA: Diagnosis not present

## 2017-11-08 DIAGNOSIS — D529 Folate deficiency anemia, unspecified: Secondary | ICD-10-CM | POA: Diagnosis not present

## 2017-12-10 ENCOUNTER — Other Ambulatory Visit: Payer: Self-pay

## 2018-03-02 DIAGNOSIS — Z23 Encounter for immunization: Secondary | ICD-10-CM | POA: Diagnosis not present

## 2018-03-30 ENCOUNTER — Other Ambulatory Visit: Payer: Self-pay

## 2018-04-06 DIAGNOSIS — D529 Folate deficiency anemia, unspecified: Secondary | ICD-10-CM | POA: Diagnosis not present

## 2018-04-06 DIAGNOSIS — F331 Major depressive disorder, recurrent, moderate: Secondary | ICD-10-CM | POA: Diagnosis not present

## 2018-04-06 DIAGNOSIS — D519 Vitamin B12 deficiency anemia, unspecified: Secondary | ICD-10-CM | POA: Diagnosis not present

## 2018-04-06 DIAGNOSIS — D649 Anemia, unspecified: Secondary | ICD-10-CM | POA: Diagnosis not present

## 2018-04-06 DIAGNOSIS — I10 Essential (primary) hypertension: Secondary | ICD-10-CM | POA: Diagnosis not present

## 2018-04-06 DIAGNOSIS — E785 Hyperlipidemia, unspecified: Secondary | ICD-10-CM | POA: Diagnosis not present

## 2018-04-11 DIAGNOSIS — I10 Essential (primary) hypertension: Secondary | ICD-10-CM | POA: Diagnosis not present

## 2018-04-11 DIAGNOSIS — Z23 Encounter for immunization: Secondary | ICD-10-CM | POA: Diagnosis not present

## 2018-04-11 DIAGNOSIS — Z0001 Encounter for general adult medical examination with abnormal findings: Secondary | ICD-10-CM | POA: Diagnosis not present

## 2018-04-11 DIAGNOSIS — Z1212 Encounter for screening for malignant neoplasm of rectum: Secondary | ICD-10-CM | POA: Diagnosis not present

## 2018-04-14 ENCOUNTER — Other Ambulatory Visit: Payer: Self-pay | Admitting: Family Medicine

## 2018-04-14 DIAGNOSIS — Z1231 Encounter for screening mammogram for malignant neoplasm of breast: Secondary | ICD-10-CM

## 2018-04-29 DIAGNOSIS — H04123 Dry eye syndrome of bilateral lacrimal glands: Secondary | ICD-10-CM | POA: Diagnosis not present

## 2018-04-29 DIAGNOSIS — H40033 Anatomical narrow angle, bilateral: Secondary | ICD-10-CM | POA: Diagnosis not present

## 2018-05-24 DIAGNOSIS — M810 Age-related osteoporosis without current pathological fracture: Secondary | ICD-10-CM | POA: Diagnosis not present

## 2018-05-24 DIAGNOSIS — M85851 Other specified disorders of bone density and structure, right thigh: Secondary | ICD-10-CM | POA: Diagnosis not present

## 2018-05-25 ENCOUNTER — Ambulatory Visit
Admission: RE | Admit: 2018-05-25 | Discharge: 2018-05-25 | Disposition: A | Discharge status: Home / Self Care | Payer: Medicare Other | Source: Ambulatory Visit | Attending: Family Medicine | Admitting: Family Medicine

## 2018-05-25 DIAGNOSIS — Z1231 Encounter for screening mammogram for malignant neoplasm of breast: Secondary | ICD-10-CM | POA: Diagnosis not present

## 2018-06-08 DIAGNOSIS — M25551 Pain in right hip: Secondary | ICD-10-CM | POA: Diagnosis not present

## 2018-06-23 DIAGNOSIS — M25551 Pain in right hip: Secondary | ICD-10-CM | POA: Diagnosis not present

## 2018-06-23 DIAGNOSIS — M1611 Unilateral primary osteoarthritis, right hip: Secondary | ICD-10-CM | POA: Diagnosis not present

## 2018-07-20 NOTE — H&P (Signed)
TOTAL HIP ADMISSION H&P  Patient is admitted for right total hip arthroplasty.  Subjective:  Chief Complaint: right hip pain  HPI: Lytle Butte, 73 y.o. female, has a history of pain and functional disability in the right hip(s) due to arthritis and patient has failed non-surgical conservative treatments for greater than 12 weeks to include use of assistive devices and activity modification.  Onset of symptoms was gradual starting 6 months ago with gradually worsening course since that time.The patient noted no past surgery on the right hip(s).  Patient currently rates pain in the right hip at 10 out of 10 with activity. Patient has worsening of pain with activity and weight bearing, pain that interfers with activities of daily living and instability. Patient has evidence of severe bone-on-bone arthritis with osteophyte formation by imaging studies. This condition presents safety issues increasing the risk of falls. There is no current active infection.  Patient Active Problem List   Diagnosis Date Noted  . OA (osteoarthritis) of hip 06/30/2017  . Postop Hypokalemia 12/11/2011  . OA (osteoarthritis) of knee 12/09/2011   Past Medical History:  Diagnosis Date  . Depression   . Diverticulosis   . Hypertension   . OA (osteoarthritis)    left hip  . PONV (postoperative nausea and vomiting)   . Wears glasses     Past Surgical History:  Procedure Laterality Date  . CARPAL TUNNEL RELEASE Right early 2000s  . DILATION AND CURETTAGE OF UTERUS  1977  . KNEE ARTHROSCOPY Right 2007 approx.  Marland Kitchen TOTAL HIP ARTHROPLASTY Left 06/30/2017   Procedure: TOTAL LEFT HIP ARTHROPLASTY ANTERIOR APPROACH;  Surgeon: Gaynelle Arabian, MD;  Location: [facility];  Service: Orthopedics;  Laterality: Left;  . TOTAL KNEE ARTHROPLASTY  12/09/2011   Procedure: TOTAL KNEE ARTHROPLASTY;  Surgeon: Gearlean Alf, MD;  Location: [facility];  Service: Orthopedics;  Laterality: Left;  . TOTAL KNEE ARTHROPLASTY Right 07/22/2015   Procedure: RIGHT TOTAL KNEE ARTHROPLASTY;  Surgeon: Gaynelle Arabian, MD;  Location: [facility];  Service: Orthopedics;  Laterality: Right;  . TUBAL LIGATION Bilateral 1977    No current facility-administered medications for this encounter.    Current Outpatient Medications  Medication Sig Dispense Refill Last Dose  . acetaminophen (TYLENOL) 500 MG tablet Take 1,000 mg by mouth daily as needed for moderate pain or headache.   More than a month at Unknown time  . hydrochlorothiazide (HYDRODIURIL) 25 MG tablet Take 12.5 mg by mouth every morning.    06/29/2017 at Unknown time  . HYDROmorphone (DILAUDID) 2 MG tablet Take 1 tablet (2 mg total) by mouth every 4 (four) hours as needed for moderate pain. 60 tablet 0   . iron polysaccharides (NIFEREX) 150 MG capsule Take 1 capsule (150 mg total) by mouth daily. Take for three weeks and then discontinue. 21 capsule 0   . methocarbamol (ROBAXIN) 500 MG tablet Take 1 tablet (500 mg total) by mouth every 6 (six) hours as needed for muscle spasms. 60 tablet 0   . rivaroxaban (XARELTO) 10 MG TABS tablet Take 1 tablet (10 mg total) by mouth daily with breakfast. Take Xarelto for two and a half more weeks following discharge from the hospital, then discontinue Xarelto. Once the patient has completed the blood thinner regimen, then take a Baby 81 mg Aspirin daily for three more weeks. 20 tablet 0   . sertraline (ZOLOFT) 100 MG tablet Take 100 mg by mouth daily.   06/30/2017 at 0430   Allergies  Allergen Reactions  .  Hydrocodone Nausea And Vomiting    vicodin  . Metronidazole Swelling    lips swelled  . Oxycodone Other (See Comments)    "MADE MY HEART RACE"  . Tramadol Nausea And Vomiting  . Ciprofloxacin Rash    Social History   Tobacco Use  . Smoking status: Never Smoker  . Smokeless tobacco: Never Used  Substance Use Topics  . Alcohol use: No    No family history on file.   Review of Systems  Constitutional: Negative for chills and fever.  HENT:  Negative for congestion, sore throat and tinnitus.   Eyes: Negative for double vision, photophobia and pain.  Respiratory: Negative for cough, shortness of breath and wheezing.   Cardiovascular: Negative for chest pain, palpitations and orthopnea.  Gastrointestinal: Negative for heartburn, nausea and vomiting.  Genitourinary: Negative for dysuria, frequency and urgency.  Musculoskeletal: Positive for joint pain.  Neurological: Negative for dizziness, weakness and headaches.    Objective:  Physical Exam  Well nourished and well developed.  General: Alert and oriented x3, cooperative and pleasant, no acute distress.  Head: normocephalic, atraumatic, neck supple.  Eyes: EOMI.  Respiratory: breath sounds clear in all fields, no wheezing, rales, or rhonchi. Cardiovascular: Regular rate and rhythm, no murmurs, gallops or rubs.  Abdomen: non-tender to palpation and soft, normoactive bowel sounds. Musculoskeletal:  Right Hip Exam: ROM: Flexion to 100, Internal Rotation 0, External Rotation 20, and Abduction 20 degrees.  There is no tenderness over the greater trochanter bursa.   Calves soft and nontender. Motor function intact in LE. Strength 5/5 LE bilaterally. Neuro: Distal pulses 2+. Sensation to light touch intact in LE.  Vital signs in last 24 hours: Blood pressure: 154/94 mmHg Pulse: 90 bpm  Labs:   Estimated body mass index is 34.38 kg/m as calculated from the following:   Height as of 06/30/17: 5\' 6"  (1.676 m).   Weight as of 06/30/17: 96.6 kg.   Imaging Review Plain radiographs demonstrate severe degenerative joint disease of the right hip(s). The bone quality appears to be adequate for age and reported activity level.      Assessment/Plan:  End stage arthritis, right hip(s)  The patient history, physical examination, clinical judgement of the provider and imaging studies are consistent with end stage degenerative joint disease of the right hip(s) and total hip  arthroplasty is deemed medically necessary. The treatment options including medical management, injection therapy, arthroscopy and arthroplasty were discussed at length. The risks and benefits of total hip arthroplasty were presented and reviewed. The risks due to aseptic loosening, infection, stiffness, dislocation/subluxation,  thromboembolic complications and other imponderables were discussed.  The patient acknowledged the explanation, agreed to proceed with the plan and consent was signed. Patient is being admitted for inpatient treatment for surgery, pain control, PT, OT, prophylactic antibiotics, VTE prophylaxis, progressive ambulation and ADL's and discharge planning.The patient is planning to be discharged home.  Therapy Plans: HEP Disposition: Home with daughter Planned DVT Prophylaxis: Aspirin 325 mg BID DME needed: None PCP: Gar Ponto, MD TXA: IV Allergies: Codeine (nausea/vomiting) Anesthesia Concerns: None BMI: 34.8  - Patient was instructed on what medications to stop prior to surgery. - Follow-up visit in 2 weeks with Dr. Wynelle Link - Begin physical therapy following surgery - Pre-operative lab work as pre-surgical testing - Prescriptions will be provided in hospital at time of discharge  Theresa Duty, PA-C Orthopedic Surgery [facility] [place]

## 2018-08-10 ENCOUNTER — Inpatient Hospital Stay (HOSPITAL_COMMUNITY): Admission: RE | Admit: 2018-08-10 | Payer: Medicare Other | Source: Home / Self Care | Admitting: Orthopedic Surgery

## 2018-08-10 ENCOUNTER — Encounter (HOSPITAL_COMMUNITY): Admission: RE | Payer: Self-pay | Source: Home / Self Care

## 2018-08-10 SURGERY — ARTHROPLASTY, HIP, TOTAL, ANTERIOR APPROACH
Anesthesia: Choice | Laterality: Right

## 2018-09-14 ENCOUNTER — Other Ambulatory Visit: Payer: Self-pay

## 2018-09-14 ENCOUNTER — Other Ambulatory Visit (HOSPITAL_COMMUNITY): Payer: Self-pay | Admitting: *Deleted

## 2018-09-14 ENCOUNTER — Encounter (HOSPITAL_COMMUNITY): Payer: Self-pay

## 2018-09-14 ENCOUNTER — Encounter (HOSPITAL_COMMUNITY)
Admission: RE | Admit: 2018-09-14 | Discharge: 2018-09-14 | Disposition: A | Discharge status: Home / Self Care | Payer: Medicare Other | Source: Ambulatory Visit | Attending: Orthopedic Surgery | Admitting: Orthopedic Surgery

## 2018-09-14 DIAGNOSIS — Z1159 Encounter for screening for other viral diseases: Secondary | ICD-10-CM | POA: Insufficient documentation

## 2018-09-14 DIAGNOSIS — Z01812 Encounter for preprocedural laboratory examination: Secondary | ICD-10-CM | POA: Diagnosis not present

## 2018-09-14 LAB — APTT: aPTT: 31 seconds (ref 24–36)

## 2018-09-14 LAB — COMPREHENSIVE METABOLIC PANEL
ALT: 19 U/L (ref 0–44)
AST: 23 U/L (ref 15–41)
Albumin: 4.7 g/dL (ref 3.5–5.0)
Alkaline Phosphatase: 99 U/L (ref 38–126)
Anion gap: 11 (ref 5–15)
BUN: 14 mg/dL (ref 8–23)
CO2: 28 mmol/L (ref 22–32)
Calcium: 10 mg/dL (ref 8.9–10.3)
Chloride: 100 mmol/L (ref 98–111)
Creatinine, Ser: 0.78 mg/dL (ref 0.44–1.00)
GFR calc Af Amer: >60 mL/min (ref >60)
GFR calc non Af Amer: >60 mL/min (ref >60)
Glucose, Bld: 112 mg/dL — ABNORMAL HIGH (ref 70–99)
Potassium: 3.5 mmol/L (ref 3.5–5.1)
Sodium: 139 mmol/L (ref 135–145)
Total Bilirubin: 0.7 mg/dL (ref 0.3–1.2)
Total Protein: 8.3 g/dL — ABNORMAL HIGH (ref 6.5–8.1)

## 2018-09-14 LAB — CBC
HCT: 43.4 % (ref 36.0–46.0)
Hemoglobin: 14.5 g/dL (ref 12.0–15.0)
MCH: 31.3 pg (ref 26.0–34.0)
MCHC: 33.4 g/dL (ref 30.0–36.0)
MCV: 93.5 fL (ref 80.0–100.0)
Platelets: 418 10*3/uL — ABNORMAL HIGH (ref 150–400)
RBC: 4.64 MIL/uL (ref 3.87–5.11)
RDW: 12.3 % (ref 11.5–15.5)
WBC: 6.7 10*3/uL (ref 4.0–10.5)
nRBC: 0 % (ref 0.0–0.2)

## 2018-09-14 LAB — PROTIME-INR
INR: 1 (ref 0.8–1.2)
Prothrombin Time: 13.3 seconds (ref 11.4–15.2)

## 2018-09-14 LAB — SURGICAL PCR SCREEN
MRSA, PCR: NEGATIVE
Staphylococcus aureus: NEGATIVE

## 2018-09-14 NOTE — H&P (Signed)
TOTAL HIP ADMISSION H&P  Patient is admitted for right total hip arthroplasty.  Subjective:  Chief Complaint: right hip pain  HPI: Christina Zhang, 73 y.o. female, has a history of pain and functional disability in the right hip(s) due to arthritis and patient has failed non-surgical conservative treatments for greater than 12 weeks to include use of assistive devices and activity modification.  Onset of symptoms was gradual starting 6 months years ago with gradually worsening course since that time.The patient noted no past surgery on the right hip(s).  Patient currently rates pain in the right hip at 9 out of 10 with activity. Patient has worsening of pain with activity and weight bearing and instability. Patient has evidence of severe bone-on-bone arthritis with osteophyte formation by imaging studies. This condition presents safety issues increasing the risk of falls. There is no current active infection.  Patient Active Problem List   Diagnosis Date Noted  . OA (osteoarthritis) of hip 06/30/2017  . Postop Hypokalemia 12/11/2011  . OA (osteoarthritis) of knee 12/09/2011   Past Medical History:  Diagnosis Date  . Depression   . Diverticulosis   . Hypertension   . OA (osteoarthritis)    left hip  . PONV (postoperative nausea and vomiting)    after ortoscopic knee surgery yrs ago  . Wears glasses     Past Surgical History:  Procedure Laterality Date  . CARPAL TUNNEL RELEASE Right early 2000s  . DILATION AND CURETTAGE OF UTERUS  1977  . KNEE ARTHROSCOPY Right 2007 approx.  Marland Kitchen TOTAL HIP ARTHROPLASTY Left 06/30/2017   Procedure: TOTAL LEFT HIP ARTHROPLASTY ANTERIOR APPROACH;  Surgeon: Gaynelle Arabian, MD;  Location: [facility];  Service: Orthopedics;  Laterality: Left;  . TOTAL KNEE ARTHROPLASTY  12/09/2011   Procedure: TOTAL KNEE ARTHROPLASTY;  Surgeon: Gearlean Alf, MD;  Location: [facility];  Service: Orthopedics;  Laterality: Left;  . TOTAL KNEE ARTHROPLASTY Right 07/22/2015   Procedure:  RIGHT TOTAL KNEE ARTHROPLASTY;  Surgeon: Gaynelle Arabian, MD;  Location: [facility];  Service: Orthopedics;  Laterality: Right;  . TUBAL LIGATION Bilateral 1977    No current facility-administered medications for this encounter.    Current Outpatient Medications  Medication Sig Dispense Refill Last Dose  . hydrochlorothiazide (HYDRODIURIL) 25 MG tablet Take 12.5 mg by mouth every morning.    06/29/2017 at Unknown time  . loratadine (CLARITIN) 10 MG tablet Take 10 mg by mouth daily as needed for allergies.     Marland Kitchen sertraline (ZOLOFT) 100 MG tablet Take 100 mg by mouth daily.   06/30/2017 at 0430  . cholecalciferol (VITAMIN D3) 25 MCG (1000 UT) tablet Take 1,000 Units by mouth daily.      Allergies  Allergen Reactions  . Hydrocodone Nausea And Vomiting    vicodin  . Metronidazole Swelling    lips swelled  . Oxycodone Other (See Comments)    "MADE MY HEART RACE"  . Tramadol Nausea And Vomiting  . Ciprofloxacin Rash    Social History   Tobacco Use  . Smoking status: Never Smoker  . Smokeless tobacco: Never Used  Substance Use Topics  . Alcohol use: Yes    Comment: occ wine    No family history on file.   ROS  Constitutional Constitutional: no fever, no chills, no night sweats, no significant weight loss  Cardiovascular Cardiovascular: no chest pain, no palpitations  Respiratory Respiratory: no cough, no shortness of breath, No COPD  Gastrointestinal Gastrointestinal: no vomiting, no nausea  Musculoskeletal Musculoskeletal: no swelling in  Joints, Joint Pain  Neurologic Neurologic: no numbness, no tingling, no difficulty with balance  Objective:  Physical Exam  Well nourished and well developed.  General: Alert and oriented x3, cooperative and pleasant, no acute distress.  Head: normocephalic, atraumatic, neck supple.  Eyes: EOMI.  Respiratory: breath sounds clear in all fields, no wheezing, rales, or rhonchi. Cardiovascular: Regular rate and rhythm, no murmurs, gallops  or rubs.  Abdomen: non-tender to palpation and soft, normoactive bowel sounds. Musculoskeletal:  Right Hip Exam: ROM: Flexion to 100, Internal Rotation 0, External Rotation 20, and Abduction 20 degrees.  There is no tenderness over the greater trochanter bursa.   Calves soft and nontender. Motor function intact in LE. Strength 5/5 LE bilaterally. Neuro: Distal pulses 2+. Sensation to light touch intact in LE.  Vital signs in last 24 hours: Temp:  [98.2 F (36.8 C)] 98.2 F (36.8 C) (05/06 0957) Pulse Rate:  [91] 91 (05/06 0957) BP: (168-175)/(74-95) 168/95 (05/06 1021) SpO2:  [97 %] 97 % (05/06 0957) Weight:  [97.6 kg] 97.6 kg (05/06 0957)  Labs:   Estimated body mass index is 34.73 kg/m as calculated from the following:   Height as of 09/14/18: 5\' 6"  (1.676 m).   Weight as of 09/14/18: 97.6 kg.   Imaging Review Plain radiographs demonstrate severe degenerative joint disease of the right hip(s). The bone quality appears to be adequate for age and reported activity level.      Assessment/Plan:  End stage arthritis, right hip(s)  The patient history, physical examination, clinical judgement of the provider and imaging studies are consistent with end stage degenerative joint disease of the right hip(s) and total hip arthroplasty is deemed medically necessary. The treatment options including medical management, injection therapy, arthroscopy and arthroplasty were discussed at length. The risks and benefits of total hip arthroplasty were presented and reviewed. The risks due to aseptic loosening, infection, stiffness, dislocation/subluxation,  thromboembolic complications and other imponderables were discussed.  The patient acknowledged the explanation, agreed to proceed with the plan and consent was signed. Patient is being admitted for inpatient treatment for surgery, pain control, PT, OT, prophylactic antibiotics, VTE prophylaxis, progressive ambulation and ADL's and discharge  planning.The patient is planning to be discharged home.   Therapy Plans: HEP Disposition: Home with daughter Planned DVT Prophylaxis: Aspirin 325 mg BID DME needed: None PCP: Gar Ponto, MD TXA: IV Allergies: Codeine (nausea/vomiting) Anesthesia Concerns: None BMI: 34.8   - Patient was instructed on what medications to stop prior to surgery. - Follow-up visit in 2 weeks with Dr. Wynelle Link - Begin physical therapy following surgery - Pre-operative lab work as pre-surgical testing - Prescriptions will be provided in hospital at time of discharge  Griffith Citron, PA-C Orthopedic Surgery [facility] [place]

## 2018-09-14 NOTE — Patient Instructions (Addendum)
Christina Zhang     Your procedure is scheduled on: 09-19-2018  Report to [facility],The Main  Entrance  Report to admitting at 825 AM    Call this number if you have problems the morning of surgery [phone]   Remember:  [place], NO [place].   NO SOLID FOOD AFTER MIDNIGHT THE NIGHT PRIOR TO SURGERY. NOTHING BY MOUTH EXCEPT CLEAR LIQUIDS UNTIL 430 am. . PLEASE FINISH ENSURE DRINK PER SURGEON ORDER  WHICH NEEDS TO BE COMPLETED 430 am, nothing by mouth after 430 am.    CLEAR LIQUID DIET   Foods Allowed                                                                     Foods Excluded  Coffee and tea, regular and decaf                             liquids that you cannot  Plain Jell-O in any flavor                                             see through such as: Fruit ices (not with fruit pulp)                                     milk, soups, orange juice  Iced Popsicles                                    All solid food Carbonated beverages, regular and diet                                    Cranberry, grape and apple juices Sports drinks like Gatorade Lightly seasoned clear broth or consume(fat free) Sugar, honey syrup  Sample Menu Breakfast                                Lunch                                     Supper Cranberry juice                    Beef broth                            Chicken broth Jell-O  Grape juice                           Apple juice Coffee or tea                        Jell-O                                      Popsicle                                                Coffee or tea                        Coffee or tea  _____________________________________________________________________    Take these medicines the morning of surgery with A SIP OF WATER: sertraline (zoloft)              You may not have any metal on your  body including hair pins and              piercings  Do not wear jewelry, make-up, lotions, powders or perfumes, deodorant             Do not wear nail polish.  Do not shave  48 hours prior to surgery.               Do not bring valuables to the hospital. [place].  Contacts, dentures or bridgework may not be worn into surgery.  Leave suitcase in the car. After surgery it may be brought to your room.     Patients discharged the day of surgery will not be allowed to drive home. IF YOU ARE HAVING SURGERY AND GOING HOME THE SAME DAY, YOU MUST HAVE AN ADULT TO DRIVE YOU HOME AND BE WITH YOU FOR 24 HOURS. YOU MAY GO HOME BY TAXI OR UBER OR ORTHERWISE, BUT AN ADULT MUST ACCOMPANY YOU HOME AND STAY WITH YOU FOR 24 HOURS.  Name and phone number of your driver:  Special Instructions: N/A              Please read over the following fact sheets you were given: _____________________________________________________________________             [facility] - Preparing for Surgery Before surgery, you can play an important role.  Because skin is not sterile, your skin needs to be as free of germs as possible.  You can reduce the number of germs on your skin by washing with CHG (chlorahexidine gluconate) soap before surgery.  CHG is an antiseptic cleaner which kills germs and bonds with the skin to continue killing germs even after washing. Please DO NOT use if you have an allergy to CHG or antibacterial soaps.  If your skin becomes reddened/irritated stop using the CHG and inform your nurse when you arrive at Short Stay. Do not shave (including legs and underarms) for at least 48 hours prior to the first CHG shower.  You may shave your face/neck. Please follow these instructions carefully:  1.  Shower with CHG Soap the night before surgery and  the  morning of Surgery.  2.  If you choose to wash your hair, wash your hair first as usual with your  normal   shampoo.  3.  After you shampoo, rinse your hair and body thoroughly to remove the  shampoo.                           4.  Use CHG as you would any other liquid soap.  You can apply chg directly  to the skin and wash                       Gently with a scrungie or clean washcloth.  5.  Apply the CHG Soap to your body ONLY FROM THE NECK DOWN.   Do not use on face/ open                           Wound or open sores. Avoid contact with eyes, ears mouth and genitals (private parts).                       Wash face,  Genitals (private parts) with your normal soap.             6.  Wash thoroughly, paying special attention to the area where your surgery  will be performed.  7.  Thoroughly rinse your body with warm water from the neck down.  8.  DO NOT shower/wash with your normal soap after using and rinsing off  the CHG Soap.                9.  Pat yourself dry with a clean towel.            10.  Wear clean pajamas.            11.  Place clean sheets on your bed the night of your first shower and do not  sleep with pets. Day of Surgery : Do not apply any lotions/deodorants the morning of surgery.  Please wear clean clothes to the hospital/surgery center.  FAILURE TO FOLLOW THESE INSTRUCTIONS MAY RESULT IN THE CANCELLATION OF YOUR SURGERY PATIENT SIGNATURE_________________________________  NURSE SIGNATURE__________________________________  ________________________________________________________________________   Christina Zhang  An incentive spirometer is a tool that can help keep your lungs clear and active. This tool measures how well you are filling your lungs with each breath. Taking long deep breaths may help reverse or decrease the chance of developing breathing (pulmonary) problems (especially infection) following:  A long period of time when you are unable to move or be active. BEFORE THE PROCEDURE   If the spirometer includes an indicator to show your best effort, your nurse or  respiratory therapist will set it to a desired goal.  If possible, sit up straight or lean slightly forward. Try not to slouch.  Hold the incentive spirometer in an upright position. INSTRUCTIONS FOR USE  1. Sit on the edge of your bed if possible, or sit up as far as you can in bed or on a chair. 2. Hold the incentive spirometer in an upright position. 3. Breathe out normally. 4. Place the mouthpiece in your mouth and seal your lips tightly around it. 5. Breathe in slowly and as deeply as possible, raising the piston or the ball toward the top of the column. 6. Hold your breath for 3-5 seconds or for as  long as possible. Allow the piston or ball to fall to the bottom of the column. 7. Remove the mouthpiece from your mouth and breathe out normally. 8. Rest for a few seconds and repeat Steps 1 through 7 at least 10 times every 1-2 hours when you are awake. Take your time and take a few normal breaths between deep breaths. 9. The spirometer may include an indicator to show your best effort. Use the indicator as a goal to work toward during each repetition. 10. After each set of 10 deep breaths, practice coughing to be sure your lungs are clear. If you have an incision (the cut made at the time of surgery), support your incision when coughing by placing a pillow or rolled up towels firmly against it. Once you are able to get out of bed, walk around indoors and cough well. You may stop using the incentive spirometer when instructed by your caregiver.  RISKS AND COMPLICATIONS  Take your time so you do not get dizzy or light-headed.  If you are in pain, you may need to take or ask for pain medication before doing incentive spirometry. It is harder to take a deep breath if you are having pain. AFTER USE  Rest and breathe slowly and easily.  It can be helpful to keep track of a log of your progress. Your caregiver can provide you with a simple table to help with this. If you are using the  spirometer at home, follow these instructions: [place] IF:   You are having difficultly using the spirometer.  You have trouble using the spirometer as often as instructed.  Your pain medication is not giving enough relief while using the spirometer.  You develop fever of 100.5 F (38.1 C) or higher. SEEK IMMEDIATE MEDICAL CARE IF:   You cough up bloody sputum that had not been present before.  You develop fever of 102 F (38.9 C) or greater.  You develop worsening pain at or near the incision site. MAKE SURE YOU:   Understand these instructions.  Will watch your condition.  Will get help right away if you are not doing well or get worse. Document Released: 09/07/2006 Document Revised: 07/20/2011 Document Reviewed: 11/08/2006 Exit[employer] Patient Information 2014 Exit[employer], [place].   ________________________________________________________________________  WHAT IS A BLOOD TRANSFUSION? Blood Transfusion Information  A transfusion is the replacement of blood or some of its parts. Blood is made up of multiple cells which provide different functions.  Red blood cells carry oxygen and are used for blood loss replacement.  White blood cells fight against infection.  Platelets control bleeding.  Plasma helps clot blood.  Other blood products are available for specialized needs, such as hemophilia or other clotting disorders. BEFORE THE TRANSFUSION  Who gives blood for transfusions?   Healthy volunteers who are fully evaluated to make sure their blood is safe. This is blood bank blood. Transfusion therapy is the safest it has ever been in the practice of medicine. Before blood is taken from a donor, a complete history is taken to make sure that person has no history of diseases nor engages in risky social behavior (examples are intravenous drug use or sexual activity with multiple partners). The donor's travel history is screened to minimize risk of transmitting  infections, such as malaria. The donated blood is tested for signs of infectious diseases, such as HIV and hepatitis. The blood is then tested to be sure it is compatible with you in order to minimize the chance  of a transfusion reaction. If you or a relative donates blood, this is often done in anticipation of surgery and is not appropriate for emergency situations. It takes many days to process the donated blood. RISKS AND COMPLICATIONS Although transfusion therapy is very safe and saves many lives, the main dangers of transfusion include:   Getting an infectious disease.  Developing a transfusion reaction. This is an allergic reaction to something in the blood you were given. Every precaution is taken to prevent this. The decision to have a blood transfusion has been considered carefully by your caregiver before blood is given. Blood is not given unless the benefits outweigh the risks. AFTER THE TRANSFUSION  Right after receiving a blood transfusion, you will usually feel much better and more energetic. This is especially true if your red blood cells have gotten low (anemic). The transfusion raises the level of the red blood cells which carry oxygen, and this usually causes an energy increase.  The nurse administering the transfusion will monitor you carefully for complications. HOME CARE INSTRUCTIONS  No special instructions are needed after a transfusion. You may find your energy is better. Speak with your caregiver about any limitations on activity for underlying diseases you may have. SEEK MEDICAL CARE IF:   Your condition is not improving after your transfusion.  You develop redness or irritation at the intravenous (IV) site. SEEK IMMEDIATE MEDICAL CARE IF:  Any of the following symptoms occur over the next 12 hours:  Shaking chills.  You have a temperature by mouth above 102 F (38.9 C), not controlled by medicine.  Chest, back, or muscle pain.  People around you feel you are  not acting correctly or are confused.  Shortness of breath or difficulty breathing.  Dizziness and fainting.  You get a rash or develop hives.  You have a decrease in urine output.  Your urine turns a dark color or changes to pink, red, or brown. Any of the following symptoms occur over the next 10 days:  You have a temperature by mouth above 102 F (38.9 C), not controlled by medicine.  Shortness of breath.  Weakness after normal activity.  The white part of the eye turns yellow (jaundice).  You have a decrease in the amount of urine or are urinating less often.  Your urine turns a dark color or changes to pink, red, or brown. Document Released: 04/24/2000 Document Revised: 07/20/2011 Document Reviewed: 12/12/2007 [facility] Patient Information 2014 [place], [place].  _______________________________________________________________________

## 2018-09-15 ENCOUNTER — Other Ambulatory Visit (HOSPITAL_COMMUNITY)
Admission: RE | Admit: 2018-09-15 | Discharge: 2018-09-15 | Disposition: A | Discharge status: Home / Self Care | Payer: Medicare Other | Source: Ambulatory Visit | Attending: Orthopedic Surgery | Admitting: Orthopedic Surgery

## 2018-09-15 DIAGNOSIS — Z01812 Encounter for preprocedural laboratory examination: Secondary | ICD-10-CM | POA: Diagnosis not present

## 2018-09-15 DIAGNOSIS — Z1159 Encounter for screening for other viral diseases: Secondary | ICD-10-CM | POA: Diagnosis not present

## 2018-09-15 NOTE — Progress Notes (Signed)
Anesthesia Chart Review   Case:  [id number] Date/Time:  09/19/18 1040   Procedure:  TOTAL HIP ARTHROPLASTY ANTERIOR APPROACH (Right )   Anesthesia type:  Choice   Pre-op diagnosis:  right hip osteoarthritis   Location:  [facility] ROOM 09 / [facility]   Surgeon:  Gaynelle Arabian, MD      DISCUSSION:73 yo never smoker with h/o PONV, depression, HTN, right hip OA scheduled for above procedure 09/19/18 with Dr. Gaynelle Arabian.   Pt can proceed with planned procedure barring acute status change.  Elevated BP at PAT visit 09/14/18, will evaluate DOS. VS: BP (!) 168/95   Pulse 91   Temp 36.8 C (Oral)   Ht 5\' 6"  (1.676 m)   Wt 97.6 kg   SpO2 97%   BMI 34.73 kg/m   PROVIDERS: Caryl Bis, MD is PCP    LABS: Labs reviewed: Acceptable for surgery. (all labs ordered are listed, but only abnormal results are displayed)  Labs Reviewed  CBC - Abnormal; Notable for the following components:      Result Value   Platelets 418 (*)    All other components within normal limits  COMPREHENSIVE METABOLIC PANEL - Abnormal; Notable for the following components:   Glucose, Bld 112 (*)    Total Protein 8.3 (*)    All other components within normal limits  SURGICAL PCR SCREEN  APTT  PROTIME-INR  TYPE AND SCREEN     IMAGES:   EKG: 09/14/2018 Rate 78 bpm Normal sinus rhythm  Nonspecific ST abnormality Abnormal ECG PVCs are no longer seen No significant change since last tracing   CV:  Past Medical History:  Diagnosis Date  . Depression   . Diverticulosis   . Hypertension   . OA (osteoarthritis)    left hip  . PONV (postoperative nausea and vomiting)    after ortoscopic knee surgery yrs ago  . Wears glasses     Past Surgical History:  Procedure Laterality Date  . CARPAL TUNNEL RELEASE Right early 2000s  . DILATION AND CURETTAGE OF UTERUS  1977  . KNEE ARTHROSCOPY Right 2007 approx.  Marland Kitchen TOTAL HIP ARTHROPLASTY Left 06/30/2017   Procedure: TOTAL LEFT HIP ARTHROPLASTY ANTERIOR APPROACH;   Surgeon: Gaynelle Arabian, MD;  Location: [facility];  Service: Orthopedics;  Laterality: Left;  . TOTAL KNEE ARTHROPLASTY  12/09/2011   Procedure: TOTAL KNEE ARTHROPLASTY;  Surgeon: Gearlean Alf, MD;  Location: [facility];  Service: Orthopedics;  Laterality: Left;  . TOTAL KNEE ARTHROPLASTY Right 07/22/2015   Procedure: RIGHT TOTAL KNEE ARTHROPLASTY;  Surgeon: Gaynelle Arabian, MD;  Location: [facility];  Service: Orthopedics;  Laterality: Right;  . TUBAL LIGATION Bilateral 1977    MEDICATIONS: . cholecalciferol (VITAMIN D3) 25 MCG (1000 UT) tablet  . hydrochlorothiazide (HYDRODIURIL) 25 MG tablet  . loratadine (CLARITIN) 10 MG tablet  . sertraline (ZOLOFT) 100 MG tablet   No current facility-administered medications for this encounter.     Maia Plan [facility] Pre-Surgical Testing [phone] 09/15/18 11:32 AM

## 2018-09-15 NOTE — Anesthesia Preprocedure Evaluation (Addendum)
Anesthesia Evaluation  Patient identified by MRN, date of birth, ID band Patient awake    Reviewed: Allergy & Precautions, NPO status , Patient's Chart, lab work & pertinent test results  History of Anesthesia Complications (+) PONV and history of anesthetic complications  Airway Mallampati: II  TM Distance: >3 FB Neck ROM: Full    Dental  (+) Teeth Intact, Dental Advisory Given, Caps   Pulmonary neg pulmonary ROS,    Pulmonary exam normal breath sounds clear to auscultation       Cardiovascular hypertension, Pt. on medications (-) angina(-) CAD and (-) Past MI Normal cardiovascular exam Rhythm:Regular Rate:Normal     Neuro/Psych PSYCHIATRIC DISORDERS Depression negative neurological ROS     GI/Hepatic negative GI ROS, Neg liver ROS,   Endo/Other  Obesity   Renal/GU negative Renal ROS     Musculoskeletal  (+) Arthritis ,   Abdominal   Peds  Hematology negative hematology ROS (+) Plt 418k   Anesthesia Other Findings Day of surgery medications reviewed with the patient.  Reproductive/Obstetrics                           Anesthesia Physical Anesthesia Plan  ASA: III  Anesthesia Plan: Spinal   Post-op Pain Management:    Induction:   PONV Risk Score and Plan: 3 and Midazolam, Propofol infusion, Ondansetron and Treatment may vary due to age or medical condition  Airway Management Planned: Natural Airway and Simple Face Mask  Additional Equipment:   Intra-op Plan:   Post-operative Plan:   Informed Consent: I have reviewed the patients History and Physical, chart, labs and discussed the procedure including the risks, benefits and alternatives for the proposed anesthesia with the patient or authorized representative who has indicated his/her understanding and acceptance.     Dental advisory given  Plan Discussed with: CRNA, Anesthesiologist and Surgeon  Anesthesia Plan  Comments:        Anesthesia Quick Evaluation

## 2018-09-16 LAB — NOVEL CORONAVIRUS, NAA (HOSP ORDER, SEND-OUT TO REF LAB; TAT 18-24 HRS): SARS-CoV-2, NAA: NOT DETECTED

## 2018-09-16 NOTE — Progress Notes (Signed)
SPOKE W/  Patient via phone     SCREENING SYMPTOMS OF COVID 19:   COUGH--no  RUNNY NOSE--- no  SORE THROAT---no  NASAL CONGESTION----no  SNEEZING----no  SHORTNESS OF BREATH---no  DIFFICULTY BREATHING---no  TEMP >100.0 -----no UNEXPLAINED BODY ACHES------no  CHILLS -------- no  HEADACHES ---------no  LOSS OF SMELL/ TASTE --------no    HAVE YOU OR ANY FAMILY MEMBER TRAVELLED PAST 14 DAYS OUT OF THE   COUNTY---yes  (between [place] and [place]) STATE----no COUNTRY----no  HAVE YOU OR ANY FAMILY MEMBER BEEN EXPOSED TO ANYONE WITH COVID 19? no

## 2018-09-19 ENCOUNTER — Other Ambulatory Visit: Payer: Self-pay

## 2018-09-19 ENCOUNTER — Inpatient Hospital Stay (HOSPITAL_COMMUNITY): Payer: Medicare Other

## 2018-09-19 ENCOUNTER — Observation Stay (HOSPITAL_COMMUNITY)
Admission: RE | Admit: 2018-09-19 | Discharge: 2018-09-20 | Disposition: A | Discharge status: Home / Self Care | Payer: Medicare Other | Attending: Orthopedic Surgery | Admitting: Orthopedic Surgery

## 2018-09-19 ENCOUNTER — Encounter (HOSPITAL_COMMUNITY): Payer: Self-pay | Admitting: Emergency Medicine

## 2018-09-19 ENCOUNTER — Inpatient Hospital Stay (HOSPITAL_COMMUNITY): Payer: Medicare Other | Admitting: Physician Assistant

## 2018-09-19 ENCOUNTER — Encounter (HOSPITAL_COMMUNITY)
Admission: RE | Disposition: A | Discharge status: Home / Self Care | Payer: Self-pay | Source: Home / Self Care | Attending: Orthopedic Surgery

## 2018-09-19 DIAGNOSIS — E669 Obesity, unspecified: Secondary | ICD-10-CM | POA: Insufficient documentation

## 2018-09-19 DIAGNOSIS — Z79899 Other long term (current) drug therapy: Secondary | ICD-10-CM | POA: Diagnosis not present

## 2018-09-19 DIAGNOSIS — T404X5A Adverse effect of other synthetic narcotics, initial encounter: Secondary | ICD-10-CM | POA: Insufficient documentation

## 2018-09-19 DIAGNOSIS — I10 Essential (primary) hypertension: Secondary | ICD-10-CM | POA: Diagnosis not present

## 2018-09-19 DIAGNOSIS — F329 Major depressive disorder, single episode, unspecified: Secondary | ICD-10-CM | POA: Insufficient documentation

## 2018-09-19 DIAGNOSIS — Z96649 Presence of unspecified artificial hip joint: Secondary | ICD-10-CM

## 2018-09-19 DIAGNOSIS — Z471 Aftercare following joint replacement surgery: Secondary | ICD-10-CM | POA: Diagnosis not present

## 2018-09-19 DIAGNOSIS — Z96653 Presence of artificial knee joint, bilateral: Secondary | ICD-10-CM | POA: Diagnosis not present

## 2018-09-19 DIAGNOSIS — M25751 Osteophyte, right hip: Secondary | ICD-10-CM | POA: Diagnosis not present

## 2018-09-19 DIAGNOSIS — M1612 Unilateral primary osteoarthritis, left hip: Secondary | ICD-10-CM | POA: Diagnosis not present

## 2018-09-19 DIAGNOSIS — M1611 Unilateral primary osteoarthritis, right hip: Secondary | ICD-10-CM | POA: Diagnosis not present

## 2018-09-19 DIAGNOSIS — Z96641 Presence of right artificial hip joint: Secondary | ICD-10-CM | POA: Diagnosis not present

## 2018-09-19 DIAGNOSIS — Z6834 Body mass index (BMI) 34.0-34.9, adult: Secondary | ICD-10-CM | POA: Insufficient documentation

## 2018-09-19 DIAGNOSIS — M169 Osteoarthritis of hip, unspecified: Secondary | ICD-10-CM | POA: Diagnosis present

## 2018-09-19 DIAGNOSIS — Z419 Encounter for procedure for purposes other than remedying health state, unspecified: Secondary | ICD-10-CM

## 2018-09-19 DIAGNOSIS — T402X5A Adverse effect of other opioids, initial encounter: Secondary | ICD-10-CM | POA: Diagnosis not present

## 2018-09-19 HISTORY — PX: TOTAL HIP ARTHROPLASTY: SHX124

## 2018-09-19 LAB — TYPE AND SCREEN
ABO/RH(D): A POS
Antibody Screen: NEGATIVE

## 2018-09-19 SURGERY — ARTHROPLASTY, HIP, TOTAL, ANTERIOR APPROACH
Anesthesia: Spinal | Laterality: Right

## 2018-09-19 MED ORDER — ONDANSETRON HCL 4 MG/2ML IJ SOLN
4.0000 mg | Freq: Four times a day (QID) | INTRAMUSCULAR | Status: DC | PRN
  Administered 2018-09-20: 4 mg via INTRAVENOUS
  Filled 2018-09-19: qty 2

## 2018-09-19 MED ORDER — BISACODYL 10 MG RE SUPP: 10.0000 mg | Freq: Every day | RECTAL | Status: DC | PRN

## 2018-09-19 MED ORDER — METHOCARBAMOL 500 MG PO TABS
500.0000 mg | ORAL_TABLET | Freq: Four times a day (QID) | ORAL | Status: DC | PRN
  Administered 2018-09-19 (×2): 500 mg via ORAL
  Filled 2018-09-19 (×2): qty 1

## 2018-09-19 MED ORDER — ACETAMINOPHEN 10 MG/ML IV SOLN
1000.0000 mg | Freq: Four times a day (QID) | INTRAVENOUS | Status: DC
  Administered 2018-09-19: 1000 mg via INTRAVENOUS
  Filled 2018-09-19: qty 100

## 2018-09-19 MED ORDER — METOCLOPRAMIDE HCL 5 MG/ML IJ SOLN: 5.0000 mg | Freq: Three times a day (TID) | INTRAMUSCULAR | Status: DC | PRN

## 2018-09-19 MED ORDER — ONDANSETRON HCL 4 MG/2ML IJ SOLN
INTRAMUSCULAR | Status: DC | PRN
  Administered 2018-09-19: 4 mg via INTRAVENOUS

## 2018-09-19 MED ORDER — METOCLOPRAMIDE HCL 5 MG PO TABS: 5.0000 mg | ORAL_TABLET | Freq: Three times a day (TID) | ORAL | Status: DC | PRN

## 2018-09-19 MED ORDER — PHENOL 1.4 % MT LIQD: 1.0000 | OROMUCOSAL | Status: DC | PRN

## 2018-09-19 MED ORDER — BUPIVACAINE-EPINEPHRINE (PF) 0.25% -1:200000 IJ SOLN
INTRAMUSCULAR | Status: AC
  Filled 2018-09-19: qty 30

## 2018-09-19 MED ORDER — 0.9 % SODIUM CHLORIDE (POUR BTL) OPTIME
TOPICAL | Status: DC | PRN
  Administered 2018-09-19: 1000 mL

## 2018-09-19 MED ORDER — FENTANYL CITRATE (PF) 100 MCG/2ML IJ SOLN
INTRAMUSCULAR | Status: AC
  Filled 2018-09-19: qty 2

## 2018-09-19 MED ORDER — DEXAMETHASONE SODIUM PHOSPHATE 10 MG/ML IJ SOLN
10.0000 mg | Freq: Once | INTRAMUSCULAR | Status: AC
  Administered 2018-09-20: 10 mg via INTRAVENOUS
  Filled 2018-09-19: qty 1

## 2018-09-19 MED ORDER — SODIUM CHLORIDE 0.9 % IV SOLN
INTRAVENOUS | Status: DC
  Administered 2018-09-19 – 2018-09-20 (×2): via INTRAVENOUS

## 2018-09-19 MED ORDER — POLYETHYLENE GLYCOL 3350 17 G PO PACK: 17.0000 g | PACK | Freq: Every day | ORAL | Status: DC | PRN

## 2018-09-19 MED ORDER — PROPOFOL 10 MG/ML IV BOLUS
INTRAVENOUS | Status: AC
  Filled 2018-09-19: qty 20

## 2018-09-19 MED ORDER — CHLORHEXIDINE GLUCONATE 4 % EX LIQD: 60.0000 mL | Freq: Once | CUTANEOUS | Status: DC

## 2018-09-19 MED ORDER — LORATADINE 10 MG PO TABS: 10.0000 mg | ORAL_TABLET | Freq: Every day | ORAL | Status: DC | PRN

## 2018-09-19 MED ORDER — DEXAMETHASONE SODIUM PHOSPHATE 10 MG/ML IJ SOLN
8.0000 mg | Freq: Once | INTRAMUSCULAR | Status: AC
  Administered 2018-09-19: 8 mg via INTRAVENOUS

## 2018-09-19 MED ORDER — HYDROMORPHONE HCL 2 MG PO TABS
2.0000 mg | ORAL_TABLET | ORAL | Status: DC | PRN
  Administered 2018-09-19 (×2): 2 mg via ORAL
  Filled 2018-09-19 (×2): qty 1

## 2018-09-19 MED ORDER — PROPOFOL 500 MG/50ML IV EMUL
INTRAVENOUS | Status: DC | PRN
  Administered 2018-09-19: 75 ug/kg/min via INTRAVENOUS

## 2018-09-19 MED ORDER — MENTHOL 3 MG MT LOZG: 1.0000 | LOZENGE | OROMUCOSAL | Status: DC | PRN

## 2018-09-19 MED ORDER — PROPOFOL 10 MG/ML IV BOLUS
INTRAVENOUS | Status: AC
  Filled 2018-09-19: qty 40

## 2018-09-19 MED ORDER — ACETAMINOPHEN 500 MG PO TABS
500.0000 mg | ORAL_TABLET | Freq: Four times a day (QID) | ORAL | Status: AC
  Administered 2018-09-19 – 2018-09-20 (×4): 500 mg via ORAL
  Filled 2018-09-19 (×4): qty 1

## 2018-09-19 MED ORDER — BUPIVACAINE IN DEXTROSE 0.75-8.25 % IT SOLN
INTRATHECAL | Status: DC | PRN
  Administered 2018-09-19: 1.6 mL via INTRATHECAL

## 2018-09-19 MED ORDER — DEXAMETHASONE SODIUM PHOSPHATE 10 MG/ML IJ SOLN
INTRAMUSCULAR | Status: AC
  Filled 2018-09-19: qty 1

## 2018-09-19 MED ORDER — LACTATED RINGERS IV SOLN
INTRAVENOUS | Status: DC
  Administered 2018-09-19 (×2): via INTRAVENOUS

## 2018-09-19 MED ORDER — ASPIRIN EC 325 MG PO TBEC
325.0000 mg | DELAYED_RELEASE_TABLET | Freq: Two times a day (BID) | ORAL | Status: DC
  Administered 2018-09-20: 325 mg via ORAL
  Filled 2018-09-19: qty 1

## 2018-09-19 MED ORDER — PHENYLEPHRINE 40 MCG/ML (10ML) SYRINGE FOR IV PUSH (FOR BLOOD PRESSURE SUPPORT)
PREFILLED_SYRINGE | INTRAVENOUS | Status: DC | PRN
  Administered 2018-09-19 (×7): 80 ug via INTRAVENOUS
  Administered 2018-09-19: 40 ug via INTRAVENOUS

## 2018-09-19 MED ORDER — TRANEXAMIC ACID-NACL 1000-0.7 MG/100ML-% IV SOLN
1000.0000 mg | INTRAVENOUS | Status: AC
  Administered 2018-09-19: 1000 mg via INTRAVENOUS
  Filled 2018-09-19: qty 100

## 2018-09-19 MED ORDER — SERTRALINE HCL 100 MG PO TABS
100.0000 mg | ORAL_TABLET | Freq: Every day | ORAL | Status: DC
  Administered 2018-09-20: 100 mg via ORAL
  Filled 2018-09-19: qty 1

## 2018-09-19 MED ORDER — ONDANSETRON HCL 4 MG PO TABS: 4.0000 mg | ORAL_TABLET | Freq: Four times a day (QID) | ORAL | Status: DC | PRN

## 2018-09-19 MED ORDER — DIPHENHYDRAMINE HCL 12.5 MG/5ML PO ELIX: 12.5000 mg | ORAL_SOLUTION | ORAL | Status: DC | PRN

## 2018-09-19 MED ORDER — CEFAZOLIN SODIUM-DEXTROSE 2-4 GM/100ML-% IV SOLN
2.0000 g | INTRAVENOUS | Status: AC
  Administered 2018-09-19: 2 g via INTRAVENOUS
  Filled 2018-09-19: qty 100

## 2018-09-19 MED ORDER — STERILE WATER FOR IRRIGATION IR SOLN
Status: DC | PRN
  Administered 2018-09-19: 2000 mL

## 2018-09-19 MED ORDER — HYDROMORPHONE HCL 1 MG/ML IJ SOLN: 1.0000 mg | INTRAMUSCULAR | Status: DC | PRN

## 2018-09-19 MED ORDER — ONDANSETRON HCL 4 MG/2ML IJ SOLN: 4.0000 mg | Freq: Once | INTRAMUSCULAR | Status: DC | PRN

## 2018-09-19 MED ORDER — CEFAZOLIN SODIUM-DEXTROSE 2-4 GM/100ML-% IV SOLN
2.0000 g | Freq: Four times a day (QID) | INTRAVENOUS | Status: AC
  Administered 2018-09-19 (×2): 2 g via INTRAVENOUS
  Filled 2018-09-19 (×2): qty 100

## 2018-09-19 MED ORDER — POVIDONE-IODINE 10 % EX SWAB
2.0000 "application " | Freq: Once | CUTANEOUS | Status: AC
  Administered 2018-09-19: 2 via TOPICAL

## 2018-09-19 MED ORDER — LIDOCAINE 2% (20 MG/ML) 5 ML SYRINGE
INTRAMUSCULAR | Status: DC | PRN
  Administered 2018-09-19 (×2): 50 mg via INTRAVENOUS

## 2018-09-19 MED ORDER — DOCUSATE SODIUM 100 MG PO CAPS
100.0000 mg | ORAL_CAPSULE | Freq: Two times a day (BID) | ORAL | Status: DC
  Administered 2018-09-19 – 2018-09-20 (×2): 100 mg via ORAL
  Filled 2018-09-19 (×2): qty 1

## 2018-09-19 MED ORDER — PHENYLEPHRINE 40 MCG/ML (10ML) SYRINGE FOR IV PUSH (FOR BLOOD PRESSURE SUPPORT)
PREFILLED_SYRINGE | INTRAVENOUS | Status: AC
  Filled 2018-09-19: qty 10

## 2018-09-19 MED ORDER — METHOCARBAMOL 1000 MG/10ML IJ SOLN: 500.0000 mg | Freq: Four times a day (QID) | INTRAVENOUS | Status: DC | PRN

## 2018-09-19 MED ORDER — PROMETHAZINE HCL 25 MG/ML IJ SOLN
6.2500 mg | INTRAMUSCULAR | Status: DC | PRN
  Administered 2018-09-19: 6.25 mg via INTRAVENOUS

## 2018-09-19 MED ORDER — BUPIVACAINE-EPINEPHRINE (PF) 0.25% -1:200000 IJ SOLN
INTRAMUSCULAR | Status: DC | PRN
  Administered 2018-09-19: 30 mL

## 2018-09-19 MED ORDER — ONDANSETRON HCL 4 MG/2ML IJ SOLN
INTRAMUSCULAR | Status: AC
  Filled 2018-09-19: qty 2

## 2018-09-19 MED ORDER — FENTANYL CITRATE (PF) 100 MCG/2ML IJ SOLN: 25.0000 ug | INTRAMUSCULAR | Status: DC | PRN

## 2018-09-19 MED ORDER — HYDROMORPHONE HCL 2 MG PO TABS: 4.0000 mg | ORAL_TABLET | ORAL | Status: DC | PRN

## 2018-09-19 MED ORDER — FLEET ENEMA 7-19 GM/118ML RE ENEM: 1.0000 | ENEMA | Freq: Once | RECTAL | Status: DC | PRN

## 2018-09-19 MED ORDER — LIDOCAINE 2% (20 MG/ML) 5 ML SYRINGE
INTRAMUSCULAR | Status: AC
  Filled 2018-09-19: qty 5

## 2018-09-19 MED ORDER — TRANEXAMIC ACID-NACL 1000-0.7 MG/100ML-% IV SOLN
1000.0000 mg | Freq: Once | INTRAVENOUS | Status: AC
  Administered 2018-09-19: 1000 mg via INTRAVENOUS
  Filled 2018-09-19: qty 100

## 2018-09-19 MED ORDER — PROMETHAZINE HCL 25 MG/ML IJ SOLN
INTRAMUSCULAR | Status: AC
  Filled 2018-09-19: qty 1

## 2018-09-19 MED ORDER — HYDROCHLOROTHIAZIDE 25 MG PO TABS
12.5000 mg | ORAL_TABLET | Freq: Every morning | ORAL | Status: DC
  Administered 2018-09-20: 12.5 mg via ORAL
  Filled 2018-09-19: qty 1

## 2018-09-19 MED ORDER — FENTANYL CITRATE (PF) 100 MCG/2ML IJ SOLN
INTRAMUSCULAR | Status: DC | PRN
  Administered 2018-09-19 (×2): 50 ug via INTRAVENOUS

## 2018-09-19 SURGICAL SUPPLY — 48 items
APL PRP STRL LF DISP 70% ISPRP (MISCELLANEOUS) ×1
BAG DECANTER FOR FLEXI CONT (MISCELLANEOUS) ×3 IMPLANT
BAG SPEC THK2 15X12 ZIP CLS (MISCELLANEOUS)
BAG ZIPLOCK 12X15 (MISCELLANEOUS) IMPLANT
BLADE SAG 18X100X1.27 (BLADE) ×3 IMPLANT
BLADE SURG SZ10 CARB STEEL (BLADE) ×6 IMPLANT
CHLORAPREP W/TINT 26 (MISCELLANEOUS) ×3 IMPLANT
CLOSURE WOUND 1/2 X4 (GAUZE/BANDAGES/DRESSINGS) ×1
COVER PERINEAL POST (MISCELLANEOUS) ×3 IMPLANT
COVER SURGICAL LIGHT HANDLE (MISCELLANEOUS) ×3 IMPLANT
COVER WAND RF STERILE (DRAPES) IMPLANT
CUP ACET PINNACLE SECTR 50MM (Hips) IMPLANT
DECANTER SPIKE VIAL GLASS SM (MISCELLANEOUS) ×3 IMPLANT
DRAPE STERI IOBAN 125X83 (DRAPES) ×3 IMPLANT
DRAPE U-SHAPE 47X51 STRL (DRAPES) ×6 IMPLANT
DRSG ADAPTIC 3X8 NADH LF (GAUZE/BANDAGES/DRESSINGS) ×3 IMPLANT
DRSG MEPILEX BORDER 4X4 (GAUZE/BANDAGES/DRESSINGS) ×3 IMPLANT
DRSG MEPILEX BORDER 4X8 (GAUZE/BANDAGES/DRESSINGS) ×3 IMPLANT
ELECT BLADE TIP CTD 4 INCH (ELECTRODE) ×3 IMPLANT
ELECT REM PT RETURN 15FT ADLT (MISCELLANEOUS) ×3 IMPLANT
EVACUATOR 1/8 PVC DRAIN (DRAIN) ×3 IMPLANT
FEM STEM 12/14 TAPER SZ 4 HIP (Orthopedic Implant) ×3 IMPLANT
FEMORAL STEM 12/14 TPR SZ4 HIP (Orthopedic Implant) IMPLANT
GLOVE BIO SURGEON STRL SZ7 (GLOVE) ×3 IMPLANT
GLOVE BIO SURGEON STRL SZ8 (GLOVE) ×3 IMPLANT
GLOVE BIOGEL PI IND STRL 7.0 (GLOVE) ×1 IMPLANT
GLOVE BIOGEL PI IND STRL 8 (GLOVE) ×1 IMPLANT
GLOVE BIOGEL PI INDICATOR 7.0 (GLOVE) ×2
GLOVE BIOGEL PI INDICATOR 8 (GLOVE) ×2
GOWN STRL REUS W/TWL LRG LVL3 (GOWN DISPOSABLE) ×6 IMPLANT
HEAD FEMORAL 32 CERAMIC (Hips) ×2 IMPLANT
HOLDER FOLEY CATH W/STRAP (MISCELLANEOUS) ×3 IMPLANT
KIT TURNOVER KIT A (KITS) IMPLANT
LINER MARATHON 32 50 (Hips) ×2 IMPLANT
MANIFOLD NEPTUNE II (INSTRUMENTS) ×3 IMPLANT
PACK ANTERIOR HIP CUSTOM (KITS) ×3 IMPLANT
PINNACLE SECTOR CUP 50MM (Hips) ×3 IMPLANT
STRIP CLOSURE SKIN 1/2X4 (GAUZE/BANDAGES/DRESSINGS) ×2 IMPLANT
SUT ETHIBOND NAB CT1 #1 30IN (SUTURE) ×3 IMPLANT
SUT MNCRL AB 4-0 PS2 18 (SUTURE) ×3 IMPLANT
SUT STRATAFIX 0 PDS 27 VIOLET (SUTURE) ×3
SUT VIC AB 2-0 CT1 27 (SUTURE) ×6
SUT VIC AB 2-0 CT1 TAPERPNT 27 (SUTURE) ×2 IMPLANT
SUTURE STRATFX 0 PDS 27 VIOLET (SUTURE) ×1 IMPLANT
SYR 50ML LL SCALE MARK (SYRINGE) IMPLANT
TAPE STRIPS DRAPE STRL (GAUZE/BANDAGES/DRESSINGS) ×2 IMPLANT
TRAY FOLEY MTR SLVR 16FR STAT (SET/KITS/TRAYS/PACK) ×3 IMPLANT
YANKAUER SUCT BULB TIP 10FT TU (MISCELLANEOUS) ×3 IMPLANT

## 2018-09-19 NOTE — Discharge Instructions (Signed)
°Dr. Frank [name] °Total Joint Specialist °[facility] °[address] °[place], [place] [address] °[phone] ° °ANTERIOR APPROACH TOTAL HIP REPLACEMENT POSTOPERATIVE DIRECTIONS ° ° °Hip Rehabilitation, Guidelines Following Surgery  °The results of a hip operation are greatly improved after range of motion and muscle strengthening exercises. Follow all safety measures which are given to protect your hip. If any of these exercises cause increased pain or swelling in your joint, decrease the amount until you are comfortable again. Then slowly increase the exercises. Call your caregiver if you have problems or questions.  ° °HOME CARE INSTRUCTIONS  °• Remove items at home which could result in a fall. This includes throw rugs or furniture in walking pathways.  °· ICE to the affected hip every three hours for 30 minutes at a time and then as needed for pain and swelling.  Continue to use ice on the hip for pain and swelling from surgery. You may notice swelling that will progress down to the foot and ankle.  This is normal after surgery.  Elevate the leg when you are not up walking on it.   °· Continue to use the breathing machine which will help keep your temperature down.  It is common for your temperature to cycle up and down following surgery, especially at night when you are not up moving around and exerting yourself.  The breathing machine keeps your lungs expanded and your temperature down. ° °DIET °You may resume your previous home diet once your are discharged from the hospital. ° °DRESSING / WOUND CARE / SHOWERING °You may change your dressing 3-5 days after surgery.  Then change the dressing every day with sterile gauze.  Please use good hand washing techniques before changing the dressing.  Do not use any lotions or creams on the incision until instructed by your surgeon. °You may start showering once you are discharged home but do not submerge the incision under water. Just pat the  incision dry and apply a dry gauze dressing on daily. °Change the surgical dressing daily and reapply a dry dressing each time. ° °ACTIVITY °Walk with your walker as instructed. °Use walker as long as suggested by your caregivers. °Avoid periods of inactivity such as sitting longer than an hour when not asleep. This helps prevent blood clots.  °You may resume a sexual relationship in one month or when given the OK by your doctor.  °You may return to work once you are cleared by your doctor.  °Do not drive a car for 6 weeks or until released by you surgeon.  °Do not drive while taking narcotics. ° °WEIGHT BEARING °Weight bearing as tolerated with assist device (walker, cane, etc) as directed, use it as long as suggested by your surgeon or therapist, typically at least 4-6 weeks. ° °POSTOPERATIVE CONSTIPATION PROTOCOL °Constipation - defined medically as fewer than three stools per week and severe constipation as less than one stool per week. ° °One of the most common issues patients have following surgery is constipation.  Even if you have a regular bowel pattern at home, your normal regimen is likely to be disrupted due to multiple reasons following surgery.  Combination of anesthesia, postoperative narcotics, change in appetite and fluid intake all can affect your bowels.  In order to avoid complications following surgery, here are some recommendations in order to help you during your recovery period. ° °Colace (docusate) - Pick up an over-the-counter form of Colace or another stool softener and take twice a day   as long as you are requiring postoperative pain medications.  Take with a full glass of water daily.  If you experience loose stools or diarrhea, hold the colace until you stool forms back up.  If your symptoms do not get better within 1 week or if they get worse, check with your doctor. ° °Dulcolax (bisacodyl) - Pick up over-the-counter and take as directed by the product packaging as needed to assist with  the movement of your bowels.  Take with a full glass of water.  Use this product as needed if not relieved by Colace only.  ° °MiraLax (polyethylene glycol) - Pick up over-the-counter to have on hand.  MiraLax is a solution that will increase the amount of water in your bowels to assist with bowel movements.  Take as directed and can mix with a glass of water, juice, soda, coffee, or tea.  Take if you go more than two days without a movement. °Do not use MiraLax more than once per day. Call your doctor if you are still constipated or irregular after using this medication for 7 days in a row. ° °If you continue to have problems with postoperative constipation, please contact the office for further assistance and recommendations.  If you experience "the worst abdominal pain ever" or develop nausea or vomiting, please contact the office immediatly for further recommendations for treatment. ° °ITCHING ° If you experience itching with your medications, try taking only a single pain pill, or even half a pain pill at a time.  You can also use Benadryl over the counter for itching or also to help with sleep.  ° °TED HOSE STOCKINGS °Wear the elastic stockings on both legs for three weeks following surgery during the day but you may remove then at night for sleeping. ° °MEDICATIONS °See your medication summary on the “After Visit Summary” that the nursing staff will review with you prior to discharge.  You may have some home medications which will be placed on hold until you complete the course of blood thinner medication.  It is important for you to complete the blood thinner medication as prescribed by your surgeon.  Continue your approved medications as instructed at time of discharge. ° °PRECAUTIONS °If you experience chest pain or shortness of breath - call 911 immediately for transfer to the hospital emergency department.  °If you develop a fever greater that 101 F, purulent drainage from wound, increased redness or  drainage from wound, foul odor from the wound/dressing, or calf pain - CONTACT YOUR SURGEON.   °                                                °FOLLOW-UP APPOINTMENTS °Make sure you keep all of your appointments after your operation with your surgeon and caregivers. You should call the office at the above phone number and make an appointment for approximately two weeks after the date of your surgery or on the date instructed by your surgeon outlined in the "After Visit Summary". ° °RANGE OF MOTION AND STRENGTHENING EXERCISES  °These exercises are designed to help you keep full movement of your hip joint. Follow your caregiver's or physical therapist's instructions. Perform all exercises about fifteen times, three times per day or as directed. Exercise both hips, even if you have had only one joint replacement. These exercises can be done on   a training (exercise) mat, on the floor, on a table or on a bed. Use whatever works the best and is most comfortable for you. Use music or television while you are exercising so that the exercises are a pleasant break in your day. This will make your life better with the exercises acting as a break in routine you can look forward to.   Lying on your back, slowly slide your foot toward your buttocks, raising your knee up off the floor. Then slowly slide your foot back down until your leg is straight again.   Lying on your back spread your legs as far apart as you can without causing discomfort.   Lying on your side, raise your upper leg and foot straight up from the floor as far as is comfortable. Slowly lower the leg and repeat.   Lying on your back, tighten up the muscle in the front of your thigh (quadriceps muscles). You can do this by keeping your leg straight and trying to raise your heel off the floor. This helps strengthen the largest muscle supporting your knee.   Lying on your back, tighten up the muscles of your buttocks both with the legs straight and with  the knee bent at a comfortable angle while keeping your heel on the floor.   IF YOU ARE TRANSFERRED TO A SKILLED REHAB FACILITY If the patient is transferred to a skilled rehab facility following release from the hospital, a list of the current medications will be sent to the facility for the patient to continue.  When discharged from the skilled rehab facility, please have the facility set up the patient's [place] prior to being released. Also, the skilled facility will be responsible for providing the patient with their medications at time of release from the facility to include their pain medication, the muscle relaxants, and their blood thinner medication. If the patient is still at the rehab facility at time of the two week follow up appointment, the skilled rehab facility will also need to assist the patient in arranging follow up appointment in our office and any transportation needs.  MAKE SURE YOU:   Understand these instructions.   Get help right away if you are not doing well or get worse.    Pick up stool softner and laxative for home use following surgery while on pain medications. Do not submerge incision under water. Please use good hand washing techniques while changing dressing each day. May shower starting three days after surgery. Please use a clean towel to pat the incision dry following showers. Continue to use ice for pain and swelling after surgery. Do not use any lotions or creams on the incision until instructed by your surgeon.

## 2018-09-19 NOTE — Anesthesia Postprocedure Evaluation (Signed)
Anesthesia Post Note  Patient: Christina Zhang  Procedure(s) Performed: TOTAL HIP ARTHROPLASTY ANTERIOR APPROACH (Right )     Patient location during evaluation: PACU Anesthesia Type: Spinal Level of consciousness: oriented, awake and alert and awake Pain management: pain level controlled Vital Signs Assessment: post-procedure vital signs reviewed and stable Respiratory status: spontaneous breathing, respiratory function stable, patient connected to nasal cannula oxygen and nonlabored ventilation Cardiovascular status: blood pressure returned to baseline and stable Postop Assessment: no headache, no backache, no apparent nausea or vomiting and spinal receding Anesthetic complications: no    Last Vitals:  Vitals:   09/19/18 1343 09/19/18 1435  BP: (!) 144/59 (!) 149/64  Pulse: 74 81  Resp: 15 16  Temp: 36.6 C 36.5 C  SpO2: 99% 98%    Last Pain:  Vitals:   09/19/18 1435  TempSrc: Oral  PainSc:                  Catalina Gravel

## 2018-09-19 NOTE — Evaluation (Signed)
Physical Therapy Evaluation Patient Details Name: Christina Zhang MRN: [id number] DOB: 31-Jul-1945 Today's Date: 09/19/2018   History of Present Illness  73 yo female s/p R DA-THA on 09/19/18. PMH includes OA, depression, L THA 2019, L and R TKR.   Clinical Impression  Pt presents with R hip pain, difficulty performing bed mobility, increased time and effort to perform mobility tasks, and decreased activity tolerance due to post-operative nausea. Pt to benefit from acute PT to address deficits. Pt ambulated short hallway distance before feeling very nauseous and hot, required sitting and return to room. Pt educated on ankle pumps (20/hour) to perform this afternoon/evening to increase circulation, to pt's tolerance and limited by pain. PT to progress mobility as tolerated, and will continue to follow acutely.        Follow Up Recommendations Follow surgeon's recommendation for DC plan and follow-up therapies;Supervision for mobility/OOB(HEP, home with daughter who is a PT)    Equipment Recommendations  None recommended by PT    Recommendations for Other Services       Precautions / Restrictions Precautions Precautions: Fall Restrictions Weight Bearing Restrictions: No      Mobility  Bed Mobility Overal bed mobility: Needs Assistance Bed Mobility: Supine to Sit;Sit to Supine     Supine to sit: Min assist;HOB elevated Sit to supine: Mod assist;HOB elevated   General bed mobility comments: Min assist for supine to sit for RLE management, increased time to scoot to EOB with use of bed rail. Mod assist for return to supine for bilateral LE lifting, pt able to scoot self up in bed.   Transfers Overall transfer level: Needs assistance Equipment used: Rolling walker (2 wheeled) Transfers: Sit to/from Stand Sit to Stand: Min guard;From elevated surface         General transfer comment: Min guard for safety, increased time to rise. Verbal cuing for hand placement.    Ambulation/Gait Ambulation/Gait assistance: Min guard Gait Distance (Feet): 20 Feet Assistive device: Rolling walker (2 wheeled) Gait Pattern/deviations: Step-to pattern;Decreased stride length;Trunk flexed Gait velocity: decr    General Gait Details: Min guard for safety, verbal cuing for upright posture, placeement in RW. At 20 ft, pt reporting nausea and feeling hot. Symptoms improved with sitting. PT brought up hallway chair behind pt, wheeled pt back to room with assist of NT.   Stairs            Wheelchair Mobility    Modified Rankin (Stroke Patients Only)       Balance Overall balance assessment: Mild deficits observed, not formally tested                                           Pertinent Vitals/Pain Pain Assessment: 0-10 Pain Score: 7  Pain Location: R hip  Pain Descriptors / Indicators: Sore;Aching Pain Intervention(s): Limited activity within patient's tolerance;Premedicated before session;Monitored during session;Repositioned;Ice applied    Home Living Family/patient expects to be discharged to:: Private residence Living Arrangements: Alone Available Help at Discharge: Family;Available 24 hours/day(daughter from [place] will be staying with pt for the first week post-surgery, pt's daughter is a PT) Type of Home: House Home Access: Stairs to enter Entrance Stairs-Rails: Right Entrance Stairs-Number of Steps: 3 Home Layout: One level Home Equipment: [place] - 2 wheels;Cane - single point;Shower seat - built in;Bedside commode      Prior Function Level of Independence: Independent with assistive  device(s)         Comments: Pt reports using cane as needed PTA, but typically did not use AD. Pt required no assist to perform ADLs PTA     Hand Dominance   Dominant Hand: Right    Extremity/Trunk Assessment   Upper Extremity Assessment Upper Extremity Assessment: Overall WFL for tasks assessed    Lower Extremity Assessment Lower  Extremity Assessment: Generalized weakness;RLE deficits/detail RLE Deficits / Details: suspected post-surgical hip weakness; able to perform ankle pumps, quad set, heel slides with assist RLE Sensation: WNL    Cervical / Trunk Assessment Cervical / Trunk Assessment: Normal  Communication   Communication: No difficulties  Cognition Arousal/Alertness: Awake/alert Behavior During Therapy: WFL for tasks assessed/performed Overall Cognitive Status: Within Functional Limits for tasks assessed                                        General Comments      Exercises     Assessment/Plan    PT Assessment Patient needs continued PT services  PT Problem List Decreased strength;Decreased mobility;Decreased activity tolerance;Decreased balance;Decreased knowledge of use of DME;Pain       PT Treatment Interventions DME instruction;Functional mobility training;Balance training;Patient/family education;Gait training;Therapeutic activities;Neuromuscular re-education;Therapeutic exercise;Stair training    PT Goals (Current goals can be found in the Care Plan section)  Acute Rehab PT Goals Patient Stated Goal: go home tomorrow if possible PT Goal Formulation: With patient Time For Goal Achievement: 09/26/18 Potential to Achieve Goals: Good    Frequency 7X/week   Barriers to discharge        Co-evaluation               AM-PAC PT "6 Clicks" Mobility  Outcome Measure Help needed turning from your back to your side while in a flat bed without using bedrails?: A Little Help needed moving from lying on your back to sitting on the side of a flat bed without using bedrails?: A Little Help needed moving to and from a bed to a chair (including a wheelchair)?: A Little Help needed standing up from a chair using your arms (e.g., wheelchair or bedside chair)?: A Little Help needed to walk in hospital room?: A Little Help needed climbing 3-5 steps with a railing? : A Lot 6  Click Score: 17    End of Session Equipment Utilized During Treatment: Gait belt;Oxygen Activity Tolerance: Patient limited by fatigue;Treatment limited secondary to medical complications (Comment)(nausea, diaphoresis) Patient left: in bed;with bed alarm set;with call bell/phone within reach;with SCD's reapplied Nurse Communication: Mobility status PT Visit Diagnosis: Other abnormalities of gait and mobility (R26.89);Difficulty in walking, not elsewhere classified (R26.2)    Time: 6045-4098 PT Time Calculation (min) (ACUTE ONLY): 24 min   Charges:   PT Evaluation $PT Eval Low Complexity: 1 Low PT Treatments $Gait Training: 8-22 mins       Julien Girt, PT Acute Rehabilitation Services Pager [phone]  Office [place] 09/19/2018, 7:23 PM

## 2018-09-19 NOTE — Interval H&P Note (Signed)
History and Physical Interval Note:  09/19/2018 8:36 AM  Christina Zhang  has presented today for surgery, with the diagnosis of right hip osteoarthritis.  The various methods of treatment have been discussed with the patient and family. After consideration of risks, benefits and other options for treatment, the patient has consented to  Procedure(s): TOTAL HIP ARTHROPLASTY ANTERIOR APPROACH (Right) as a surgical intervention.  The patient's history has been reviewed, patient examined, no change in status, stable for surgery.  I have reviewed the patient's chart and labs.  Questions were answered to the patient's satisfaction.     Pilar Plate [name]

## 2018-09-19 NOTE — Anesthesia Procedure Notes (Signed)
Spinal  Patient location during procedure: OR End time: 09/19/2018 9:45 AM Staffing Resident/CRNA: Noralyn Pick D, CRNA Performed: anesthesiologist and resident/CRNA  Preanesthetic Checklist Completed: patient identified, site marked, surgical consent, pre-op evaluation, timeout performed, IV checked, risks and benefits discussed and monitors and equipment checked Spinal Block Patient position: sitting Prep: Betadine Patient monitoring: heart rate, continuous pulse ox and blood pressure Approach: midline Location: L3-4 Injection technique: single-shot Needle Needle type: Sprotte  Needle gauge: 24 G Needle length: 9 cm Assessment Sensory level: T6 Additional Notes Expiration date of kit checked and confirmed. Patient tolerated procedure well, without complications.

## 2018-09-19 NOTE — Op Note (Signed)
OPERATIVE REPORT- TOTAL HIP ARTHROPLASTY   PREOPERATIVE DIAGNOSIS: Osteoarthritis of the Right hip.   POSTOPERATIVE DIAGNOSIS: Osteoarthritis of the Right  hip.   PROCEDURE: Right total hip arthroplasty, anterior approach.   SURGEON: Gaynelle Arabian, MD   ASSISTANT: Griffith Citron, PA-C  ANESTHESIA:  Spinal  ESTIMATED BLOOD LOSS:-500 mL    DRAINS: Hemovac x1.   COMPLICATIONS: None   CONDITION: PACU - hemodynamically stable.   BRIEF CLINICAL NOTE: Christina Zhang is a 73 y.o. female who has advanced end-  stage arthritis of their Right  hip with progressively worsening pain and  dysfunction.The patient has failed nonoperative management and presents for  total hip arthroplasty.   PROCEDURE IN DETAIL: After successful administration of spinal  anesthetic, the traction boots for the [facility] bed were placed on both  feet and the patient was placed onto the [facility] bed, boots placed into the leg  holders. The Right hip was then isolated from the perineum with plastic  drapes and prepped and draped in the usual sterile fashion. ASIS and  greater trochanter were marked and a oblique incision was made, starting  at about 1 cm lateral and 2 cm distal to the ASIS and coursing towards  the anterior cortex of the femur. The skin was cut with a 10 blade  through subcutaneous tissue to the level of the fascia overlying the  tensor fascia lata muscle. The fascia was then incised in line with the  incision at the junction of the anterior third and posterior 2/3rd. The  muscle was teased off the fascia and then the interval between the TFL  and the rectus was developed. The Hohmann retractor was then placed at  the top of the femoral neck over the capsule. The vessels overlying the  capsule were cauterized and the fat on top of the capsule was removed.  A Hohmann retractor was then placed anterior underneath the rectus  femoris to give exposure to the entire anterior capsule. A T-shaped   capsulotomy was performed. The edges were tagged and the femoral head  was identified.       Osteophytes are removed off the superior acetabulum.  The femoral neck was then cut in situ with an oscillating saw. Traction  was then applied to the left lower extremity utilizing the [facility]  traction. The femoral head was then removed. Retractors were placed  around the acetabulum and then circumferential removal of the labrum was  performed. Osteophytes were also removed. Reaming starts at 45 mm to  medialize and  Increased in 2 mm increments to 49 mm. We reamed in  approximately 40 degrees of abduction, 20 degrees anteversion. A 50 mm  pinnacle acetabular shell was then impacted in anatomic position under  fluoroscopic guidance with excellent purchase. We did not need to place  any additional dome screws. A 32 mm neutral + 4 marathon liner was then  placed into the acetabular shell.       The femoral lift was then placed along the lateral aspect of the femur  just distal to the vastus ridge. The leg was  externally rotated and capsule  was stripped off the inferior aspect of the femoral neck down to the  level of the lesser trochanter, this was done with electrocautery. The femur was lifted after this was performed. The  leg was then placed in an extended and adducted position essentially delivering the femur. We also removed the capsule superiorly and the piriformis from the piriformis  fossa to gain excellent exposure of the  proximal femur. Rongeur was used to remove some cancellous bone to get  into the lateral portion of the proximal femur for placement of the  initial starter reamer. The starter broaches was placed  the starter broach  and was shown to go down the center of the canal. Broaching  with the Actis system was then performed starting at size 0  coursing  Up to size 5. A size 5 had excellent torsional and rotational  and axial stability. The trial high offset neck was then placed   with a 32 + 1 trial head. The hip was then reduced. We confirmed that  the stem was in the canal both on AP and lateral x-rays. It also has excellent sizing. The hip was reduced with outstanding stability through full extension and full external rotation.. AP pelvis was taken and the leg lengths were measured and found to be equal. Hip was then dislocated again and the femoral head and neck removed. The  femoral broach was removed. Size 5 Actis stem with a high offset  neck was then impacted into the femur following native anteversion. Has  excellent purchase in the canal. Excellent torsional and rotational and  axial stability. It is confirmed to be in the canal on AP and lateral  fluoroscopic views. The 32 + 1 ceramic head was placed and the hip  reduced with outstanding stability. Again AP pelvis was taken and it  confirmed that the leg lengths were equal. The wound was then copiously  irrigated with saline solution and the capsule reattached and repaired  with Ethibond suture. 30 ml of .25% Bupivicaine was  injected into the capsule and into the edge of the tensor fascia lata as well as subcutaneous tissue. The fascia overlying the tensor fascia lata was then closed with a running #1 V-Loc. Subcu was closed with interrupted 2-0 Vicryl and subcuticular running 4-0 Monocryl. Incision was cleaned  and dried. Steri-Strips and a bulky sterile dressing applied. Hemovac  drain was hooked to suction and then the patient was awakened and transported to  recovery in stable condition.        Please note that a surgical assistant was a medical necessity for this procedure to perform it in a safe and expeditious manner. Assistant was necessary to provide appropriate retraction of vital neurovascular structures and to prevent femoral fracture and allow for anatomic placement of the prosthesis.  Gaynelle Arabian, M.D.

## 2018-09-19 NOTE — Transfer of Care (Signed)
Immediate Anesthesia Transfer of Care Note  Patient: Christina Zhang  Procedure(s) Performed: TOTAL HIP ARTHROPLASTY ANTERIOR APPROACH (Right )  Patient Location: PACU  Anesthesia Type:Spinal  Level of Consciousness: awake, alert  and oriented  Airway & Oxygen Therapy: Patient Spontanous Breathing and Patient connected to face mask oxygen  Post-op Assessment: Report given to RN and Post -op Vital signs reviewed and stable  Post vital signs: Reviewed and stable  Last Vitals:  Vitals Value Taken Time  BP 135/66 09/19/2018 11:37 AM  Temp    Pulse 78 09/19/2018 11:40 AM  Resp 14 09/19/2018 11:40 AM  SpO2 100 % 09/19/2018 11:40 AM  Vitals shown include unvalidated device data.  Last Pain:  Vitals:   09/19/18 0835  TempSrc:   PainSc: 3       Patients Stated Pain Goal: 4 (72/90/21 1155)  Complications: No apparent anesthesia complications

## 2018-09-19 NOTE — Care Plan (Signed)
Ortho Bundle Case Management Note  Patient Details  Name: Christina Zhang MRN: [id number] Date of Birth: [birth year]  R THA scheduled 09-19-18 DCP:  Home with dtr and niece.  1 story home with 3 ste. DME:  No needs.  Has a RW and 3-in-1. PT:  HEP                   DME Arranged:  N/A DME Agency:  NA  HH Arranged:  NA HH Agency:  NA  Additional Comments: Please contact me with any questions of if this plan should need to change.  Marianne Sofia, RN,CCM [facility]  [phone] 09/19/2018, 10:50 AM

## 2018-09-20 ENCOUNTER — Encounter (HOSPITAL_COMMUNITY): Payer: Self-pay | Admitting: Orthopedic Surgery

## 2018-09-20 DIAGNOSIS — M25751 Osteophyte, right hip: Secondary | ICD-10-CM | POA: Diagnosis not present

## 2018-09-20 DIAGNOSIS — M1611 Unilateral primary osteoarthritis, right hip: Secondary | ICD-10-CM | POA: Diagnosis not present

## 2018-09-20 DIAGNOSIS — E669 Obesity, unspecified: Secondary | ICD-10-CM | POA: Diagnosis not present

## 2018-09-20 DIAGNOSIS — F329 Major depressive disorder, single episode, unspecified: Secondary | ICD-10-CM | POA: Diagnosis not present

## 2018-09-20 DIAGNOSIS — I10 Essential (primary) hypertension: Secondary | ICD-10-CM | POA: Diagnosis not present

## 2018-09-20 DIAGNOSIS — M1612 Unilateral primary osteoarthritis, left hip: Secondary | ICD-10-CM | POA: Diagnosis not present

## 2018-09-20 LAB — CBC
HCT: 31.4 % — ABNORMAL LOW (ref 36.0–46.0)
Hemoglobin: 10 g/dL — ABNORMAL LOW (ref 12.0–15.0)
MCH: 30.5 pg (ref 26.0–34.0)
MCHC: 31.8 g/dL (ref 30.0–36.0)
MCV: 95.7 fL (ref 80.0–100.0)
Platelets: 279 10*3/uL (ref 150–400)
RBC: 3.28 MIL/uL — ABNORMAL LOW (ref 3.87–5.11)
RDW: 12.5 % (ref 11.5–15.5)
WBC: 11.9 10*3/uL — ABNORMAL HIGH (ref 4.0–10.5)
nRBC: 0 % (ref 0.0–0.2)

## 2018-09-20 LAB — BASIC METABOLIC PANEL
Anion gap: 7 (ref 5–15)
BUN: 13 mg/dL (ref 8–23)
CO2: 26 mmol/L (ref 22–32)
Calcium: 8.3 mg/dL — ABNORMAL LOW (ref 8.9–10.3)
Chloride: 107 mmol/L (ref 98–111)
Creatinine, Ser: 0.7 mg/dL (ref 0.44–1.00)
GFR calc Af Amer: >60 mL/min (ref >60)
GFR calc non Af Amer: >60 mL/min (ref >60)
Glucose, Bld: 139 mg/dL — ABNORMAL HIGH (ref 70–99)
Potassium: 3.4 mmol/L — ABNORMAL LOW (ref 3.5–5.1)
Sodium: 140 mmol/L (ref 135–145)

## 2018-09-20 MED ORDER — HYDROMORPHONE HCL 2 MG PO TABS: 2.0000 mg | ORAL_TABLET | Freq: Four times a day (QID) | ORAL | 0 refills | Status: AC | PRN

## 2018-09-20 MED ORDER — ASPIRIN 325 MG PO TBEC: 325.0000 mg | DELAYED_RELEASE_TABLET | Freq: Two times a day (BID) | ORAL | 0 refills | Status: AC

## 2018-09-20 MED ORDER — METHOCARBAMOL 500 MG PO TABS: 500.0000 mg | ORAL_TABLET | Freq: Four times a day (QID) | ORAL | 0 refills | Status: AC | PRN

## 2018-09-20 NOTE — Care Management CC44 (Signed)
Condition Code 44 Documentation Completed  Patient Details  Name: Christina Zhang MRN: [id number] Date of Birth: November 16, 1945   Condition Code 44 given:  Yes Patient signature on Condition Code 44 notice:  Yes Documentation of 2 MD's agreement:  Yes Code 44 added to claim:  Yes    Joaquin Courts, RN 09/20/2018, 4:04 PM

## 2018-09-20 NOTE — Progress Notes (Signed)
Physical Therapy Treatment Patient Details Name: Christina Zhang MRN: [id number] DOB: [birth year] Today's Date: 09/20/2018    History of Present Illness 73 yo female s/p R DA-THA on 09/19/18. PMH includes OA, depression, L THA 2019, L and R TKR.     PT Comments    POD # 1 pm session Assisted with amb, practiced stairs.  Addressed all mobility questions, discussed appropriate activity, educated on use of ICE.  Pt ready for D/C to home.   Follow Up Recommendations  Follow surgeon's recommendation for DC plan and follow-up therapies;Supervision for mobility/OOB     Equipment Recommendations  None recommended by PT    Recommendations for Other Services       Precautions / Restrictions Precautions Precautions: Fall Restrictions Weight Bearing Restrictions: No    Mobility  Bed Mobility               General bed mobility comments: OOB in recliner   Transfers Overall transfer level: Needs assistance Equipment used: Rolling walker (2 wheeled) Transfers: Sit to/from Stand Sit to Stand: Min guard;From elevated surface;Supervision         General transfer comment: Min guard for safety, increased time to rise. Verbal cuing for hand placement.   Ambulation/Gait Ambulation/Gait assistance: Supervision;Min guard Gait Distance (Feet): 26 Feet Assistive device: Rolling walker (2 wheeled) Gait Pattern/deviations: Step-to pattern;Decreased stride length;Trunk flexed Gait velocity: decr    General Gait Details: 25% VC's on proper walker to self and safety with turns.     Stairs Stairs: Yes Stairs assistance: Supervision;Min guard Stair Management: Two rails;Step to pattern Number of Stairs: 3 General stair comments: <25% VC's on proper sequencing and safety   Wheelchair Mobility    Modified Rankin (Stroke Patients Only)       Balance                                            Cognition Arousal/Alertness: Awake/alert Behavior During  Therapy: WFL for tasks assessed/performed Overall Cognitive Status: Within Functional Limits for tasks assessed                                        Exercises      General Comments        Pertinent Vitals/Pain Pain Assessment: 0-10 Pain Score: 4  Pain Location: R hip  Pain Descriptors / Indicators: Sore;Aching Pain Intervention(s): Monitored during session;Repositioned;Premedicated before session;Ice applied    Home Living                      Prior Function            PT Goals (current goals can now be found in the care plan section) Progress towards PT goals: Progressing toward goals    Frequency    7X/week      PT Plan Current plan remains appropriate    Co-evaluation              AM-PAC PT "6 Clicks" Mobility   Outcome Measure  Help needed turning from your back to your side while in a flat bed without using bedrails?: A Little Help needed moving from lying on your back to sitting on the side of a flat bed without using bedrails?: A Little Help needed moving to and  from a bed to a chair (including a wheelchair)?: A Little Help needed standing up from a chair using your arms (e.g., wheelchair or bedside chair)?: A Little Help needed to walk in hospital room?: A Little   6 Click Score: 15    End of Session Equipment Utilized During Treatment: Gait belt Activity Tolerance: Patient limited by fatigue;Treatment limited secondary to medical complications (Comment) Patient left: in chair;with call bell/phone within reach Nurse Communication: Mobility status PT Visit Diagnosis: Other abnormalities of gait and mobility (R26.89);Difficulty in walking, not elsewhere classified (R26.2)     Time: 1350-1415 PT Time Calculation (min) (ACUTE ONLY): 25 min  Charges:  $Gait Training: 8-22 mins $Therapeutic Activity: 8-22 mins                     Rica Koyanagi  PTA Acute  Rehabilitation Services Pager      [phone] Office       [phone]

## 2018-09-20 NOTE — Plan of Care (Signed)
  Problem: Clinical Measurements: Goal: Postoperative complications will be avoided or minimized Outcome: Progressing   Problem: Pain Management: Goal: Pain level will decrease with appropriate interventions Outcome: Progressing   Problem: Skin Integrity: Goal: Will show signs of wound healing Outcome: Progressing   Problem: Activity: Goal: Ability to avoid complications of mobility impairment will improve Outcome: Progressing

## 2018-09-20 NOTE — Care Management Obs Status (Signed)
[facility] NOTIFICATION   Patient Details  Name: RYNN MARKIEWICZ MRN: [id number] Date of Birth: [birth year]   Medicare Observation Status Notification Given:  Yes    Joaquin Courts, RN 09/20/2018, 4:04 PM

## 2018-09-20 NOTE — Progress Notes (Signed)
   Subjective: 1 Day Post-Op Procedure(s) (LRB): TOTAL HIP ARTHROPLASTY ANTERIOR APPROACH (Right) Patient reports pain as mild.   Patient seen in rounds by Dr. Wynelle Link. Patient is well, and has had no acute complaints or problems other than pain in the right hip. Denies chest pain or SOB. Foley catheter removed this AM. No issues overnight.  We will continue therapy today.   Objective: Vital signs in last 24 hours: Temp:  [97.5 F (36.4 C)-98.6 F (37 C)] 98.6 F (37 C) (05/12 0536) Pulse Rate:  [72-93] 93 (05/12 0536) Resp:  [11-17] 16 (05/12 0536) BP: (116-167)/(52-85) 116/52 (05/12 0536) SpO2:  [96 %-100 %] 97 % (05/12 0536) Weight:  [97.6 kg] 97.6 kg (05/11 0835)  Intake/Output from previous day:  Intake/Output Summary (Last 24 hours) at 09/20/2018 0700 Last data filed at 09/20/2018 0600 Gross per 24 hour  Intake 4768.29 ml  Output 1960 ml  Net 2808.29 ml     Intake/Output this shift: Total I/O In: 1373.1 [P.O.:270; I.V.:1000.8; IV Piggyback:102.3] Out: 600 [Urine:600]  Labs: Recent Labs    09/20/18 0335  HGB 10.0*   Recent Labs    09/20/18 0335  WBC 11.9*  RBC 3.28*  HCT 31.4*  PLT 279   Recent Labs    09/20/18 0335  NA 140  K 3.4*  CL 107  CO2 26  BUN 13  CREATININE 0.70  GLUCOSE 139*  CALCIUM 8.3*   Exam: General - Patient is Alert and Oriented Extremity - Neurologically intact Neurovascular intact Sensation intact distally Dorsiflexion/Plantar flexion intact Dressing - dressing C/D/I Motor Function - intact, moving foot and toes well on exam.   Past Medical History:  Diagnosis Date  . Depression   . Diverticulosis   . Hypertension   . OA (osteoarthritis)    left hip  . PONV (postoperative nausea and vomiting)    after ortoscopic knee surgery yrs ago  . Wears glasses     Assessment/Plan: 1 Day Post-Op Procedure(s) (LRB): TOTAL HIP ARTHROPLASTY ANTERIOR APPROACH (Right) Active Problems:   OA (osteoarthritis) of hip   Estimated body mass index is 34.73 kg/m as calculated from the following:   Height as of this encounter: 5\' 6"  (1.676 m).   Weight as of this encounter: 97.6 kg. Advance diet Up with therapy D/C IV fluids  DVT Prophylaxis - Aspirin Weight bearing as tolerated. D/C O2 and pulse ox and try on room air. Hemovac pulled without difficulty, will continue therapy.  Plan is to go Home after hospital stay. Plan for discharge with HEP later today after two sessions of physical therapy as long as meeting her goals. Follow-up in the office in 2 weeks with Dr. Wynelle Link.  Theresa Duty, PA-C Orthopedic Surgery 09/20/2018, 7:00 AM

## 2018-09-20 NOTE — Progress Notes (Signed)
Physical Therapy Treatment Patient Details Name: STAPHANIE HARBISON MRN: [id number] DOB: 1945/07/04 Today's Date: 09/20/2018    History of Present Illness 73 yo female s/p R DA-THA on 09/19/18. PMH includes OA, depression, L THA 2019, L and R TKR.     PT Comments    POD # 1 am session Assisted with amb in hallway. Then returned to room to perform some TE's following HEP handout.  Instructed on proper tech, freq as well as use of ICE.     Follow Up Recommendations  Follow surgeon's recommendation for DC plan and follow-up therapies;Supervision for mobility/OOB     Equipment Recommendations  None recommended by PT    Recommendations for Other Services       Precautions / Restrictions Precautions Precautions: Fall Restrictions Weight Bearing Restrictions: No    Mobility  Bed Mobility               General bed mobility comments: OOB in recliner   Transfers Overall transfer level: Needs assistance Equipment used: Rolling walker (2 wheeled) Transfers: Sit to/from Stand Sit to Stand: Min guard;From elevated surface;Supervision         General transfer comment: Min guard for safety, increased time to rise. Verbal cuing for hand placement.   Ambulation/Gait Ambulation/Gait assistance: Supervision;Min guard Gait Distance (Feet): 30 Feet Assistive device: Rolling walker (2 wheeled) Gait Pattern/deviations: Step-to pattern;Decreased stride length;Trunk flexed Gait velocity: decr    General Gait Details: 25% VC's on proper walker to self and safety with turns.     Stairs             Wheelchair Mobility    Modified Rankin (Stroke Patients Only)       Balance                                            Cognition Arousal/Alertness: Awake/alert Behavior During Therapy: WFL for tasks assessed/performed Overall Cognitive Status: Within Functional Limits for tasks assessed                                         Exercises   Total Hip Replacement TE's 10 reps ankle pumps 10 reps knee presses 10 reps heel slides 10 reps SAQ's 10 reps ABD Followed by ICE     General Comments        Pertinent Vitals/Pain Pain Assessment: 0-10 Pain Score: 4  Pain Location: R hip  Pain Descriptors / Indicators: Sore;Aching Pain Intervention(s): Monitored during session;Repositioned;Premedicated before session;Ice applied    Home Living                      Prior Function            PT Goals (current goals can now be found in the care plan section) Progress towards PT goals: Progressing toward goals    Frequency    7X/week      PT Plan Current plan remains appropriate    Co-evaluation              AM-PAC PT "6 Clicks" Mobility   Outcome Measure  Help needed turning from your back to your side while in a flat bed without using bedrails?: A Little Help needed moving from lying on your back to sitting on the  side of a flat bed without using bedrails?: A Little Help needed moving to and from a bed to a chair (including a wheelchair)?: A Little Help needed standing up from a chair using your arms (e.g., wheelchair or bedside chair)?: A Little Help needed to walk in hospital room?: A Little   6 Click Score: 15    End of Session Equipment Utilized During Treatment: Gait belt Activity Tolerance: Patient limited by fatigue;Treatment limited secondary to medical complications (Comment) Patient left: in chair;with call bell/phone within reach Nurse Communication: Mobility status PT Visit Diagnosis: Other abnormalities of gait and mobility (R26.89);Difficulty in walking, not elsewhere classified (R26.2)     Time: 6314-9702 PT Time Calculation (min) (ACUTE ONLY): 25 min  Charges:  $Gait Training: 8-22 mins $Therapeutic Exercise: 8-22 mins                     Rica Koyanagi  PTA Acute  Rehabilitation Services Pager      [phone] Office      [phone]

## 2018-09-21 NOTE — Discharge Summary (Signed)
Physician Discharge Summary   Patient ID: [name] MRN: [id number] DOB/AGE: 71-Dec-1947 71 y.o.  Admit date: 09/19/2018 Discharge date: 09/20/2018  Primary Diagnosis: Osteoarthritis, right hip   Admission Diagnoses:  Past Medical History:  Diagnosis Date  . Depression   . Diverticulosis   . Hypertension   . OA (osteoarthritis)    left hip  . PONV (postoperative nausea and vomiting)    after ortoscopic knee surgery yrs ago  . Wears glasses    Discharge Diagnoses:   Active Problems:   OA (osteoarthritis) of hip  Estimated body mass index is 34.73 kg/m as calculated from the following:   Height as of this encounter: 5\' 6"  (1.676 m).   Weight as of this encounter: 97.6 kg.  Procedure:  Procedure(s) (LRB): TOTAL HIP ARTHROPLASTY ANTERIOR APPROACH (Right)   Consults: None  HPI: Christina Zhang is a 73 y.o. female who has advanced end-stage arthritis of their Right  hip with progressively worsening pain and dysfunction.The patient has failed nonoperative management and presents for total hip arthroplasty.   Laboratory Data: Admission on 09/19/2018, Discharged on 09/20/2018  Component Date Value Ref Range Status  . WBC 09/20/2018 11.9* 4.0 - 10.5 K/uL Final  . RBC 09/20/2018 3.28* 3.87 - 5.11 MIL/uL Final  . Hemoglobin 09/20/2018 10.0* 12.0 - 15.0 g/dL Final  . HCT 09/20/2018 31.4* 36.0 - 46.0 % Final  . MCV 09/20/2018 95.7  80.0 - 100.0 fL Final  . MCH 09/20/2018 30.5  26.0 - 34.0 pg Final  . MCHC 09/20/2018 31.8  30.0 - 36.0 g/dL Final  . RDW 09/20/2018 12.5  11.5 - 15.5 % Final  . Platelets 09/20/2018 279  150 - 400 K/uL Final  . nRBC 09/20/2018 0.0  0.0 - 0.2 % Final   Performed at [facility], [place] [address], [place], [place] [address]  . Sodium 09/20/2018 140  135 - 145 mmol/L Final  . Potassium 09/20/2018 3.4* 3.5 - 5.1 mmol/L Final  . Chloride 09/20/2018 107  98 - 111 mmol/L Final  . CO2 09/20/2018 26  22 - 32 mmol/L Final  . Glucose,  Bld 09/20/2018 139* 70 - 99 mg/dL Final  . BUN 09/20/2018 13  8 - 23 mg/dL Final  . Creatinine, Ser 09/20/2018 0.70  0.44 - 1.00 mg/dL Final  . Calcium 09/20/2018 8.3* 8.9 - 10.3 mg/dL Final  . GFR calc non Af Amer 09/20/2018 >60  >60 mL/min Final  . GFR calc Af Amer 09/20/2018 >60  >60 mL/min Final  . Anion gap 09/20/2018 7  5 - 15 Final   Performed at [facility], [place] [address], [place], [place] [address]  Hospital Outpatient Visit on 09/15/2018  Component Date Value Ref Range Status  . SARS-CoV-2, NAA 09/15/2018 NOT DETECTED  NOT DETECTED Final   Comment: (NOTE) This test was developed and its performance characteristics determined by [employer]. This test has not been FDA cleared or approved. This test has been authorized by FDA under an Emergency Use Authorization (EUA). This test is only authorized for the duration of time the declaration that circumstances exist justifying the authorization of the emergency use of in vitro diagnostic tests for detection of SARS-CoV-2 virus and/or diagnosis of COVID-19 infection under section 564(b)(1) of the Act, 21 U.S.C. 154MGQ-6(P)(6), unless the authorization is terminated or revoked sooner. When diagnostic testing is negative, the possibility of a false negative result should be considered in the context of a patient's recent exposures and the presence  of clinical signs and symptoms consistent with COVID-19. An individual without symptoms of COVID-19 and who is not shedding SARS-CoV-2 virus would expect to have a negative (not detected) result in this assay. Performed                           At: [facility] [place], [place] [id number] Rush Farmer MD [id number]   . Coronavirus Source 09/15/2018 NASOPHARYNGEAL   Final   Performed at [facility], [place] [address], [place], [place] [address]  Hospital Outpatient Visit on 09/14/2018  Component Date Value Ref Range Status  .  MRSA, PCR 09/14/2018 NEGATIVE  NEGATIVE Final  . Staphylococcus aureus 09/14/2018 NEGATIVE  NEGATIVE Final   Comment: (NOTE) The Xpert SA Assay (FDA approved for NASAL specimens in patients 30 years of age and older), is one component of a comprehensive surveillance program. It is not intended to diagnose infection nor to guide or monitor treatment. Performed at [facility], [place] [address], [place], [place] [address]   . aPTT 09/14/2018 31  24 - 36 seconds Final   Performed at [facility], [place] [address], [place], [place] [address]  . WBC 09/14/2018 6.7  4.0 - 10.5 K/uL Final  . RBC 09/14/2018 4.64  3.87 - 5.11 MIL/uL Final  . Hemoglobin 09/14/2018 14.5  12.0 - 15.0 g/dL Final  . HCT 09/14/2018 43.4  36.0 - 46.0 % Final  . MCV 09/14/2018 93.5  80.0 - 100.0 fL Final  . MCH 09/14/2018 31.3  26.0 - 34.0 pg Final  . MCHC 09/14/2018 33.4  30.0 - 36.0 g/dL Final  . RDW 09/14/2018 12.3  11.5 - 15.5 % Final  . Platelets 09/14/2018 418* 150 - 400 K/uL Final  . nRBC 09/14/2018 0.0  0.0 - 0.2 % Final   Performed at [place] [address], [place], [place] [address]  . Sodium 09/14/2018 139  135 - 145 mmol/L Final  . Potassium 09/14/2018 3.5  3.5 - 5.1 mmol/L Final  . Chloride 09/14/2018 100  98 - 111 mmol/L Final  . CO2 09/14/2018 28  22 - 32 mmol/L Final  . Glucose, Bld 09/14/2018 112* 70 - 99 mg/dL Final  . BUN 09/14/2018 14  8 - 23 mg/dL Final  . Creatinine, Ser 09/14/2018 0.78  0.44 - 1.00 mg/dL Final  . Calcium 09/14/2018 10.0  8.9 - 10.3 mg/dL Final  . Total Protein 09/14/2018 8.3* 6.5 - 8.1 g/dL Final  . Albumin 09/14/2018 4.7  3.5 - 5.0 g/dL Final  . AST 09/14/2018 23  15 - 41 U/L Final  . ALT 09/14/2018 19  0 - 44 U/L Final  . Alkaline Phosphatase 09/14/2018 99  38 - 126 U/L Final  . Total Bilirubin 09/14/2018 0.7  0.3 - 1.2 mg/dL Final  . GFR calc non Af Amer 09/14/2018 >60  >60 mL/min Final  . GFR calc Af Amer  09/14/2018 >60  >60 mL/min Final  . Anion gap 09/14/2018 11  5 - 15 Final   Performed at [facility], [place] [address], [place], [place] [address]  . Prothrombin Time 09/14/2018 13.3  11.4 - 15.2 seconds Final  . INR 09/14/2018 1.0  0.8 - 1.2 Final   Comment: (NOTE) INR goal varies based on device and disease states. Performed at [facility], [place] [address] [place], [place] [address]   . ABO/RH(D) 09/14/2018 A  POS   Final  . Antibody Screen 09/14/2018 NEG   Final  . Sample Expiration 09/14/2018 09/22/2018,2359   Final  . Extend sample reason 09/14/2018    Final                   Value:NO TRANSFUSIONS OR PREGNANCY IN THE PAST 3 MONTHS Performed at [facility], [place] [address], [place], [place] [address]      X-Rays:Dg Pelvis Portable  Result Date: 09/19/2018 CLINICAL DATA:  Right hip replacement EXAM: PORTABLE PELVIS 1-2 VIEWS COMPARISON:  06/30/2017 FINDINGS: Remote changes of left hip replacement and new changes of right hip replacement. Normal AP alignment. No hardware bony complicating feature. IMPRESSION: Right hip replacement.  No complicating feature. Electronically Signed   By: Rolm Baptise M.D.   On: 09/19/2018 12:20   Dg C-arm 1-60 Min-no Report  Result Date: 09/19/2018 Fluoroscopy was utilized by the requesting physician.  No radiographic interpretation.   Dg Hip Operative Unilat With Pelvis Right  Result Date: 09/19/2018 CLINICAL DATA:  Elective surgery. EXAM: OPERATIVE right HIP (WITH PELVIS IF PERFORMED) 2 VIEWS TECHNIQUE: Fluoroscopic spot image(s) were submitted for interpretation post-operatively. COMPARISON:  06/30/2017 FINDINGS: Interval placement of right total hip arthroplasty with prosthesis intact and normally located. Recommend correlation with findings at the time of the procedure. Partially visualized left hip arthroplasty unchanged. IMPRESSION: Right hip arthroplasty without complicating features.  Electronically Signed   By: Marin Olp M.D.   On: 09/19/2018 11:41    EKG: Orders placed or performed in visit on 09/14/18  . EKG 12-Lead  . EKG 12-Lead  . EKG 12-Lead     Hospital Course: Christina Zhang is a 73 y.o. who was admitted to [facility]. They were brought to the operating room on 09/19/2018 and underwent Procedure(s): [place].  Patient tolerated the procedure well and was later transferred to the recovery room and then to the orthopaedic floor for postoperative care. They were given PO and IV analgesics for pain control following their surgery. They were given 24 hours of postoperative antibiotics of  Anti-infectives (From admission, onward)   Start     Dose/Rate Route Frequency Ordered Stop   09/19/18 1600  ceFAZolin (ANCEF) IVPB 2g/100 mL premix     2 g 200 mL/hr over 30 Minutes Intravenous Every 6 hours 09/19/18 1253 09/19/18 2206   09/19/18 0815  ceFAZolin (ANCEF) IVPB 2g/100 mL premix     2 g 200 mL/hr over 30 Minutes Intravenous On call to O.R. 09/19/18 4332 09/19/18 0946     and started on DVT prophylaxis in the form of Aspirin.   PT and OT were ordered for total joint protocol. Discharge planning consulted to help with postop disposition and equipment needs.  Patient had a good night on the evening of surgery. They started to get up OOB with therapy on POD #0. Pt was seen during rounds and was ready to go home pending progress with therapy. Hemovac drain was pulled without difficulty. She worked with therapy on POD #1 and was meeting her goals. Pt was discharged to home later that day in stable condition.  Diet: Regular diet Activity: WBAT Follow-up: in 2 weeks Disposition: Home with HEP Discharged Condition: stable   Discharge Instructions    Call MD / Call 911   Complete by:  As directed    If you experience chest pain or shortness of breath, CALL 911 and be transported to the hospital emergency room.  If you develope  a fever above 101 F, pus (white drainage) or increased drainage or redness at the wound, or calf pain, call your surgeon's office.   Change dressing   Complete by:  As directed    You may change your dressing on Wednesday, then change the dressing daily with sterile 4 x 4 inch gauze dressing and paper tape.   Constipation Prevention   Complete by:  As directed    Drink plenty of fluids.  Prune juice may be helpful.  You may use a stool softener, such as Colace (over the counter) 100 mg twice a day.  Use MiraLax (over the counter) for constipation as needed.   Diet - low sodium heart healthy   Complete by:  As directed    Discharge instructions   Complete by:  As directed    Dr. Gaynelle Arabian Total Joint Specialist [facility] [address] [place], [place] [address] [phone]  ANTERIOR APPROACH TOTAL HIP REPLACEMENT POSTOPERATIVE DIRECTIONS   Hip Rehabilitation, Guidelines Following Surgery  The results of a hip operation are greatly improved after range of motion and muscle strengthening exercises. Follow all safety measures which are given to protect your hip. If any of these exercises cause increased pain or swelling in your joint, decrease the amount until you are comfortable again. Then slowly increase the exercises. Call your caregiver if you have problems or questions.   HOME CARE INSTRUCTIONS  Remove items at home which could result in a fall. This includes throw rugs or furniture in walking pathways.  ICE to the affected hip every three hours for 30 minutes at a time and then as needed for pain and swelling.  Continue to use ice on the hip for pain and swelling from surgery. You may notice swelling that will progress down to the foot and ankle.  This is normal after surgery.  Elevate the leg when you are not up walking on it.   Continue to use the breathing machine which will help keep your temperature down.  It is common for your temperature to cycle up and down  following surgery, especially at night when you are not up moving around and exerting yourself.  The breathing machine keeps your lungs expanded and your temperature down.  DIET You may resume your previous home diet once your are discharged from the hospital.  DRESSING / WOUND CARE / SHOWERING You may change your dressing 3-5 days after surgery.  Then change the dressing every day with sterile gauze.  Please use good hand washing techniques before changing the dressing.  Do not use any lotions or creams on the incision until instructed by your surgeon. You may start showering once you are discharged home but do not submerge the incision under water. Just pat the incision dry and apply a dry gauze dressing on daily. Change the surgical dressing daily and reapply a dry dressing each time.  ACTIVITY Walk with your walker as instructed. Use walker as long as suggested by your caregivers. Avoid periods of inactivity such as sitting longer than an hour when not asleep. This helps prevent blood clots.  You may resume a sexual relationship in one month or when given the OK by your doctor.  You may return to work once you are cleared by your doctor.  Do not drive a car for 6 weeks or until released by you surgeon.  Do not drive while taking narcotics.  WEIGHT BEARING Weight bearing as tolerated with assist  device (walker, cane, etc) as directed, use it as long as suggested by your surgeon or therapist, typically at least 4-6 weeks.  POSTOPERATIVE CONSTIPATION PROTOCOL Constipation - defined medically as fewer than three stools per week and severe constipation as less than one stool per week.  One of the most common issues patients have following surgery is constipation.  Even if you have a regular bowel pattern at home, your normal regimen is likely to be disrupted due to multiple reasons following surgery.  Combination of anesthesia, postoperative narcotics, change in appetite and fluid intake all  can affect your bowels.  In order to avoid complications following surgery, here are some recommendations in order to help you during your recovery period.  Colace (docusate) - Pick up an over-the-counter form of Colace or another stool softener and take twice a day as long as you are requiring postoperative pain medications.  Take with a full glass of water daily.  If you experience loose stools or diarrhea, hold the colace until you stool forms back up.  If your symptoms do not get better within 1 week or if they get worse, check with your doctor.  Dulcolax (bisacodyl) - Pick up over-the-counter and take as directed by the product packaging as needed to assist with the movement of your bowels.  Take with a full glass of water.  Use this product as needed if not relieved by Colace only.   MiraLax (polyethylene glycol) - Pick up over-the-counter to have on hand.  MiraLax is a solution that will increase the amount of water in your bowels to assist with bowel movements.  Take as directed and can mix with a glass of water, juice, soda, coffee, or tea.  Take if you go more than two days without a movement. Do not use MiraLax more than once per day. Call your doctor if you are still constipated or irregular after using this medication for 7 days in a row.  If you continue to have problems with postoperative constipation, please contact the office for further assistance and recommendations.  If you experience "the worst abdominal pain ever" or develop nausea or vomiting, please contact the office immediatly for further recommendations for treatment.  ITCHING  If you experience itching with your medications, try taking only a single pain pill, or even half a pain pill at a time.  You can also use Benadryl over the counter for itching or also to help with sleep.   TED HOSE STOCKINGS Wear the elastic stockings on both legs for three weeks following surgery during the day but you may remove then at night for  sleeping.  MEDICATIONS See your medication summary on the "After Visit Summary" that the nursing staff will review with you prior to discharge.  You may have some home medications which will be placed on hold until you complete the course of blood thinner medication.  It is important for you to complete the blood thinner medication as prescribed by your surgeon.  Continue your approved medications as instructed at time of discharge.  PRECAUTIONS If you experience chest pain or shortness of breath - call 911 immediately for transfer to the hospital emergency department.  If you develop a fever greater that 101 F, purulent drainage from wound, increased redness or drainage from wound, foul odor from the wound/dressing, or calf pain - CONTACT YOUR SURGEON.  FOLLOW-UP APPOINTMENTS Make sure you keep all of your appointments after your operation with your surgeon and caregivers. You should call the office at the above phone number and make an appointment for approximately two weeks after the date of your surgery or on the date instructed by your surgeon outlined in the "After Visit Summary".  RANGE OF MOTION AND STRENGTHENING EXERCISES  These exercises are designed to help you keep full movement of your hip joint. Follow your caregiver's or physical therapist's instructions. Perform all exercises about fifteen times, three times per day or as directed. Exercise both hips, even if you have had only one joint replacement. These exercises can be done on a training (exercise) mat, on the floor, on a table or on a bed. Use whatever works the best and is most comfortable for you. Use music or television while you are exercising so that the exercises are a pleasant break in your day. This will make your life better with the exercises acting as a break in routine you can look forward to.  Lying on your back, slowly slide your foot toward your buttocks, raising your  knee up off the floor. Then slowly slide your foot back down until your leg is straight again.  Lying on your back spread your legs as far apart as you can without causing discomfort.  Lying on your side, raise your upper leg and foot straight up from the floor as far as is comfortable. Slowly lower the leg and repeat.  Lying on your back, tighten up the muscle in the front of your thigh (quadriceps muscles). You can do this by keeping your leg straight and trying to raise your heel off the floor. This helps strengthen the largest muscle supporting your knee.  Lying on your back, tighten up the muscles of your buttocks both with the legs straight and with the knee bent at a comfortable angle while keeping your heel on the floor.   IF YOU ARE TRANSFERRED TO A SKILLED REHAB FACILITY If the patient is transferred to a skilled rehab facility following release from the hospital, a list of the current medications will be sent to the facility for the patient to continue.  When discharged from the skilled rehab facility, please have the facility set up the patient's [place] prior to being released. Also, the skilled facility will be responsible for providing the patient with their medications at time of release from the facility to include their pain medication, the muscle relaxants, and their blood thinner medication. If the patient is still at the rehab facility at time of the two week follow up appointment, the skilled rehab facility will also need to assist the patient in arranging follow up appointment in our office and any transportation needs.  MAKE SURE YOU:  Understand these instructions.  Get help right away if you are not doing well or get worse.    Pick up stool softner and laxative for home use following surgery while on pain medications. Do not submerge incision under water. Please use good hand washing techniques while changing dressing each day. May shower starting three  days after surgery. Please use a clean towel to pat the incision dry following showers. Continue to use ice for pain and swelling after surgery. Do not use any lotions or creams on the incision until instructed by your surgeon.   Do not sit on low chairs, stoools or toilet seats, as it may be difficult to get up from low  surfaces   Complete by:  As directed    Driving restrictions   Complete by:  As directed    No driving for two weeks   TED hose   Complete by:  As directed    Use stockings (TED hose) for three weeks on both leg(s).  You may remove them at night for sleeping.   Weight bearing as tolerated   Complete by:  As directed      Allergies as of 09/20/2018      Reactions   Hydrocodone Nausea And Vomiting   vicodin   Metronidazole Swelling   lips swelled   Oxycodone Other (See Comments)   "MADE MY HEART RACE"   Tramadol Nausea And Vomiting   Ciprofloxacin Rash      Medication List    TAKE these medications   aspirin 325 MG EC tablet Take 1 tablet (325 mg total) by mouth 2 (two) times daily for 20 days. Then take one 81 mg aspirin once a day for three weeks. Then discontinue aspirin.   cholecalciferol 25 MCG (1000 UT) tablet Commonly known as:  VITAMIN D3 Take 1,000 Units by mouth daily.   hydrochlorothiazide 25 MG tablet Commonly known as:  HYDRODIURIL Take 12.5 mg by mouth every morning.   HYDROmorphone 2 MG tablet Commonly known as:  DILAUDID Take 1-2 tablets (2-4 mg total) by mouth every 6 (six) hours as needed for moderate pain.   loratadine 10 MG tablet Commonly known as:  CLARITIN Take 10 mg by mouth daily as needed for allergies.   methocarbamol 500 MG tablet Commonly known as:  ROBAXIN Take 1 tablet (500 mg total) by mouth every 6 (six) hours as needed for muscle spasms.   sertraline 100 MG tablet Commonly known as:  ZOLOFT Take 100 mg by mouth daily.            Discharge Care Instructions  (From admission, onward)         Start      Ordered   09/20/18 0000  Weight bearing as tolerated     09/20/18 0703   09/20/18 0000  Change dressing    Comments:  You may change your dressing on Wednesday, then change the dressing daily with sterile 4 x 4 inch gauze dressing and paper tape.   09/20/18 0703         Follow-up Information    Gaynelle Arabian, MD. Go on 10/04/2018.   Specialty:  Orthopedic Surgery Why:  You are scheduled for a post-operative appointment on 10-04-2018 at 2:15 pm.  Contact information: [address] [place] [address] [phone]           Signed: Theresa Duty, PA-C Orthopedic Surgery 09/21/2018, 9:03 AM

## 2018-09-24 ENCOUNTER — Other Ambulatory Visit: Payer: Self-pay

## 2018-09-24 ENCOUNTER — Inpatient Hospital Stay (HOSPITAL_COMMUNITY)
Admission: EM | Admit: 2018-09-24 | Discharge: 2018-09-28 | DRG: 536 | Disposition: A | Discharge status: Skilled Nursing Facility | Payer: Medicare Other | Attending: Orthopedic Surgery | Admitting: Orthopedic Surgery

## 2018-09-24 ENCOUNTER — Emergency Department (HOSPITAL_COMMUNITY): Payer: Medicare Other

## 2018-09-24 ENCOUNTER — Encounter (HOSPITAL_COMMUNITY): Payer: Self-pay

## 2018-09-24 DIAGNOSIS — Z03818 Encounter for observation for suspected exposure to other biological agents ruled out: Secondary | ICD-10-CM | POA: Diagnosis not present

## 2018-09-24 DIAGNOSIS — Z96651 Presence of right artificial knee joint: Secondary | ICD-10-CM | POA: Diagnosis present

## 2018-09-24 DIAGNOSIS — I10 Essential (primary) hypertension: Secondary | ICD-10-CM | POA: Diagnosis not present

## 2018-09-24 DIAGNOSIS — E876 Hypokalemia: Secondary | ICD-10-CM | POA: Diagnosis present

## 2018-09-24 DIAGNOSIS — M25551 Pain in right hip: Secondary | ICD-10-CM | POA: Diagnosis not present

## 2018-09-24 DIAGNOSIS — R0902 Hypoxemia: Secondary | ICD-10-CM | POA: Diagnosis not present

## 2018-09-24 DIAGNOSIS — M9701XA Periprosthetic fracture around internal prosthetic right hip joint, initial encounter: Secondary | ICD-10-CM | POA: Diagnosis not present

## 2018-09-24 DIAGNOSIS — F32A Depression, unspecified: Secondary | ICD-10-CM | POA: Diagnosis present

## 2018-09-24 DIAGNOSIS — Z96642 Presence of left artificial hip joint: Secondary | ICD-10-CM | POA: Diagnosis present

## 2018-09-24 DIAGNOSIS — Z96643 Presence of artificial hip joint, bilateral: Secondary | ICD-10-CM | POA: Diagnosis not present

## 2018-09-24 DIAGNOSIS — D649 Anemia, unspecified: Secondary | ICD-10-CM | POA: Diagnosis not present

## 2018-09-24 DIAGNOSIS — X58XXXA Exposure to other specified factors, initial encounter: Secondary | ICD-10-CM | POA: Diagnosis present

## 2018-09-24 DIAGNOSIS — E538 Deficiency of other specified B group vitamins: Secondary | ICD-10-CM | POA: Diagnosis present

## 2018-09-24 DIAGNOSIS — Z01818 Encounter for other preprocedural examination: Secondary | ICD-10-CM | POA: Diagnosis not present

## 2018-09-24 DIAGNOSIS — Z7982 Long term (current) use of aspirin: Secondary | ICD-10-CM | POA: Diagnosis not present

## 2018-09-24 DIAGNOSIS — S72001G Fracture of unspecified part of neck of right femur, subsequent encounter for closed fracture with delayed healing: Secondary | ICD-10-CM

## 2018-09-24 DIAGNOSIS — Z96641 Presence of right artificial hip joint: Secondary | ICD-10-CM

## 2018-09-24 DIAGNOSIS — Z79899 Other long term (current) drug therapy: Secondary | ICD-10-CM | POA: Diagnosis not present

## 2018-09-24 DIAGNOSIS — Y9301 Activity, walking, marching and hiking: Secondary | ICD-10-CM | POA: Diagnosis present

## 2018-09-24 DIAGNOSIS — Z1159 Encounter for screening for other viral diseases: Secondary | ICD-10-CM

## 2018-09-24 DIAGNOSIS — D638 Anemia in other chronic diseases classified elsewhere: Secondary | ICD-10-CM | POA: Diagnosis present

## 2018-09-24 DIAGNOSIS — I959 Hypotension, unspecified: Secondary | ICD-10-CM | POA: Diagnosis not present

## 2018-09-24 DIAGNOSIS — R12 Heartburn: Secondary | ICD-10-CM | POA: Diagnosis present

## 2018-09-24 DIAGNOSIS — M25511 Pain in right shoulder: Secondary | ICD-10-CM | POA: Diagnosis not present

## 2018-09-24 DIAGNOSIS — R29898 Other symptoms and signs involving the musculoskeletal system: Secondary | ICD-10-CM | POA: Diagnosis not present

## 2018-09-24 DIAGNOSIS — F329 Major depressive disorder, single episode, unspecified: Secondary | ICD-10-CM | POA: Diagnosis present

## 2018-09-24 DIAGNOSIS — S72114A Nondisplaced fracture of greater trochanter of right femur, initial encounter for closed fracture: Principal | ICD-10-CM | POA: Diagnosis present

## 2018-09-24 DIAGNOSIS — Y92013 Bedroom of single-family (private) house as the place of occurrence of the external cause: Secondary | ICD-10-CM | POA: Diagnosis not present

## 2018-09-24 DIAGNOSIS — R52 Pain, unspecified: Secondary | ICD-10-CM | POA: Diagnosis not present

## 2018-09-24 LAB — CBC WITH DIFFERENTIAL/PLATELET
Abs Immature Granulocytes: 0.11 10*3/uL — ABNORMAL HIGH (ref 0.00–0.07)
Basophils Absolute: 0 10*3/uL (ref 0.0–0.1)
Basophils Relative: 0 %
Eosinophils Absolute: 0.1 10*3/uL (ref 0.0–0.5)
Eosinophils Relative: 1 %
HCT: 32.7 % — ABNORMAL LOW (ref 36.0–46.0)
Hemoglobin: 10.7 g/dL — ABNORMAL LOW (ref 12.0–15.0)
Immature Granulocytes: 1 %
Lymphocytes Relative: 24 %
Lymphs Abs: 2.3 10*3/uL (ref 0.7–4.0)
MCH: 31 pg (ref 26.0–34.0)
MCHC: 32.7 g/dL (ref 30.0–36.0)
MCV: 94.8 fL (ref 80.0–100.0)
Monocytes Absolute: 0.9 10*3/uL (ref 0.1–1.0)
Monocytes Relative: 10 %
Neutro Abs: 6.2 10*3/uL (ref 1.7–7.7)
Neutrophils Relative %: 64 %
Platelets: 458 10*3/uL — ABNORMAL HIGH (ref 150–400)
RBC: 3.45 MIL/uL — ABNORMAL LOW (ref 3.87–5.11)
RDW: 12.4 % (ref 11.5–15.5)
WBC: 9.7 10*3/uL (ref 4.0–10.5)
nRBC: 0 % (ref 0.0–0.2)

## 2018-09-24 LAB — BASIC METABOLIC PANEL
Anion gap: 10 (ref 5–15)
BUN: 11 mg/dL (ref 8–23)
CO2: 25 mmol/L (ref 22–32)
Calcium: 8.8 mg/dL — ABNORMAL LOW (ref 8.9–10.3)
Chloride: 103 mmol/L (ref 98–111)
Creatinine, Ser: 0.59 mg/dL (ref 0.44–1.00)
GFR calc Af Amer: >60 mL/min (ref >60)
GFR calc non Af Amer: >60 mL/min (ref >60)
Glucose, Bld: 98 mg/dL (ref 70–99)
Potassium: 3.4 mmol/L — ABNORMAL LOW (ref 3.5–5.1)
Sodium: 138 mmol/L (ref 135–145)

## 2018-09-24 LAB — SARS CORONAVIRUS 2 BY RT PCR (HOSPITAL ORDER, PERFORMED IN ~~LOC~~ HOSPITAL LAB): SARS Coronavirus 2: NEGATIVE

## 2018-09-24 MED ORDER — SERTRALINE HCL 100 MG PO TABS
100.0000 mg | ORAL_TABLET | Freq: Every day | ORAL | Status: DC
  Administered 2018-09-25 – 2018-09-28 (×4): 100 mg via ORAL
  Filled 2018-09-24 (×4): qty 1

## 2018-09-24 MED ORDER — ASPIRIN 325 MG PO TABS
325.0000 mg | ORAL_TABLET | Freq: Two times a day (BID) | ORAL | Status: DC
  Administered 2018-09-24 – 2018-09-25 (×2): 325 mg via ORAL
  Filled 2018-09-24 (×2): qty 1

## 2018-09-24 MED ORDER — SODIUM CHLORIDE 0.9% FLUSH
3.0000 mL | Freq: Two times a day (BID) | INTRAVENOUS | Status: DC
  Administered 2018-09-24 – 2018-09-28 (×5): 3 mL via INTRAVENOUS

## 2018-09-24 MED ORDER — POLYETHYLENE GLYCOL 3350 17 G PO PACK: 17.0000 g | PACK | Freq: Every day | ORAL | Status: DC | PRN

## 2018-09-24 MED ORDER — DIAZEPAM 5 MG PO TABS
5.0000 mg | ORAL_TABLET | Freq: Once | ORAL | Status: AC
  Administered 2018-09-24: 5 mg via ORAL
  Filled 2018-09-24: qty 1

## 2018-09-24 MED ORDER — HYDROCODONE-ACETAMINOPHEN 5-325 MG PO TABS
1.0000 | ORAL_TABLET | ORAL | Status: DC | PRN
  Administered 2018-09-24: 1 via ORAL
  Administered 2018-09-25 – 2018-09-27 (×3): 2 via ORAL
  Filled 2018-09-24 (×4): qty 2

## 2018-09-24 MED ORDER — METHOCARBAMOL 500 MG PO TABS
500.0000 mg | ORAL_TABLET | Freq: Four times a day (QID) | ORAL | Status: DC | PRN
  Administered 2018-09-24 – 2018-09-27 (×3): 500 mg via ORAL
  Filled 2018-09-24 (×3): qty 1

## 2018-09-24 MED ORDER — HYDROMORPHONE HCL 2 MG PO TABS
2.0000 mg | ORAL_TABLET | Freq: Once | ORAL | Status: AC
  Administered 2018-09-24: 2 mg via ORAL
  Filled 2018-09-24: qty 1

## 2018-09-24 MED ORDER — ASPIRIN EC 325 MG PO TBEC: 325.0000 mg | DELAYED_RELEASE_TABLET | Freq: Two times a day (BID) | ORAL | Status: DC

## 2018-09-24 MED ORDER — ENOXAPARIN SODIUM 40 MG/0.4ML ~~LOC~~ SOLN
40.0000 mg | SUBCUTANEOUS | Status: DC
  Administered 2018-09-24 – 2018-09-27 (×4): 40 mg via SUBCUTANEOUS
  Filled 2018-09-24 (×5): qty 0.4

## 2018-09-24 MED ORDER — HYDROMORPHONE HCL 2 MG PO TABS: 2.0000 mg | ORAL_TABLET | Freq: Four times a day (QID) | ORAL | Status: DC | PRN

## 2018-09-24 MED ORDER — ACETAMINOPHEN 650 MG RE SUPP: 650.0000 mg | Freq: Four times a day (QID) | RECTAL | Status: DC | PRN

## 2018-09-24 MED ORDER — OXYCODONE-ACETAMINOPHEN 5-325 MG PO TABS
1.0000 | ORAL_TABLET | Freq: Once | ORAL | Status: DC
  Filled 2018-09-24: qty 1

## 2018-09-24 MED ORDER — HYDROCHLOROTHIAZIDE 25 MG PO TABS
12.5000 mg | ORAL_TABLET | Freq: Every morning | ORAL | Status: DC
  Administered 2018-09-25 – 2018-09-28 (×4): 12.5 mg via ORAL
  Filled 2018-09-24 (×4): qty 1

## 2018-09-24 MED ORDER — ACETAMINOPHEN 325 MG PO TABS: 650.0000 mg | ORAL_TABLET | Freq: Four times a day (QID) | ORAL | Status: DC | PRN

## 2018-09-24 NOTE — H&P (Signed)
History and Physical    Christina Zhang:[id number] DOB: [birth year] DOA: 09/24/2018  PCP: Caryl Bis, MD  Patient coming from: Home  I have personally briefly reviewed patient's old medical records in [place]  Chief Complaint: Right hip pain  HPI: Christina Zhang is a 73 y.o. female with medical history significant of hypertension and recent total right hip arthroplasty on 09/19/2018 presented to the emergency department with a complaint of hip pain.  According to patient, she tried to stand up from her bed today and she was fine up until then and then she started hurting her right hip when she started take her foot forward and felt a pull.  She was unable to move her right leg so she was helped out by her daughter nearby.  EMS was called and patient was brought into the emergency department.  Her pain is currently 4 out of 10.  She is comfortable.  Initial x-ray of hip was negative.  Orthopedics was consulted and recommended CT of the hip which revealed acute nondisplaced periprosthetic fracture of the greater trochanter.  Orthopedics recommended admission to hospital service and they will consult.  ED Course: Upon initial presentation, patient was hemodynamically stable.  Labs are still pending.  CT hip shows acute nondisplaced periprosthetic fracture of the right greater trochanter.  Review of Systems: As per HPI otherwise 10 point review of systems negative.    Past Medical History:  Diagnosis Date  . Depression   . Diverticulosis   . Hypertension   . OA (osteoarthritis)    left hip  . PONV (postoperative nausea and vomiting)    after ortoscopic knee surgery yrs ago  . Wears glasses     Past Surgical History:  Procedure Laterality Date  . CARPAL TUNNEL RELEASE Right early 2000s  . DILATION AND CURETTAGE OF UTERUS  1977  . KNEE ARTHROSCOPY Right 2007 approx.  Marland Kitchen TOTAL HIP ARTHROPLASTY Left 06/30/2017   Procedure: TOTAL LEFT HIP ARTHROPLASTY ANTERIOR APPROACH;   Surgeon: Gaynelle Arabian, MD;  Location: [facility];  Service: Orthopedics;  Laterality: Left;  . TOTAL HIP ARTHROPLASTY Right 09/19/2018   Procedure: TOTAL HIP ARTHROPLASTY ANTERIOR APPROACH;  Surgeon: Gaynelle Arabian, MD;  Location: [facility];  Service: Orthopedics;  Laterality: Right;  . TOTAL KNEE ARTHROPLASTY  12/09/2011   Procedure: TOTAL KNEE ARTHROPLASTY;  Surgeon: Gearlean Alf, MD;  Location: [facility];  Service: Orthopedics;  Laterality: Left;  . TOTAL KNEE ARTHROPLASTY Right 07/22/2015   Procedure: RIGHT TOTAL KNEE ARTHROPLASTY;  Surgeon: Gaynelle Arabian, MD;  Location: [facility];  Service: Orthopedics;  Laterality: Right;  . TUBAL LIGATION Bilateral 1977     reports that she has never smoked. She has never used smokeless tobacco. She reports current alcohol use. She reports that she does not use drugs.  Allergies  Allergen Reactions  . Hydrocodone Nausea And Vomiting    vicodin  . Metronidazole Swelling    lips swelled  . Oxycodone Other (See Comments)    "MADE MY HEART RACE"  . Tramadol Nausea And Vomiting  . Ciprofloxacin Rash    No family history on file.  Prior to Admission medications   Medication Sig Start Date End Date Taking? Authorizing Provider  aspirin EC 325 MG EC tablet Take 1 tablet (325 mg total) by mouth 2 (two) times daily for 20 days. Then take one 81 mg aspirin once a day for three weeks. Then discontinue aspirin. 09/20/18 10/10/18 Yes Edmisten, Ok Anis, PA  cholecalciferol (VITAMIN  D3) 25 MCG (1000 UT) tablet Take 1,000 Units by mouth daily.   Yes [provider]  hydrochlorothiazide (HYDRODIURIL) 25 MG tablet Take 12.5 mg by mouth every morning.    Yes [provider]  HYDROmorphone (DILAUDID) 2 MG tablet Take 1-2 tablets (2-4 mg total) by mouth every 6 (six) hours as needed for moderate pain. 09/20/18  Yes Edmisten, Kristie L, PA  loratadine (CLARITIN) 10 MG tablet Take 10 mg by mouth daily as needed for allergies.   Yes [provider]   methocarbamol (ROBAXIN) 500 MG tablet Take 1 tablet (500 mg total) by mouth every 6 (six) hours as needed for muscle spasms. 09/20/18  Yes Edmisten, Kristie L, PA  sertraline (ZOLOFT) 100 MG tablet Take 100 mg by mouth daily.   Yes [provider]    Physical Exam: Vitals:   09/24/18 1255 09/24/18 1256 09/24/18 1620  BP: (!) 176/96  (!) 143/71  Pulse: 94  83  Resp: 16  16  Temp: 98 F (36.7 C)    TempSrc: Oral    SpO2: 99% 94% 99%    Constitutional: NAD, calm, comfortable Vitals:   09/24/18 1255 09/24/18 1256 09/24/18 1620  BP: (!) 176/96  (!) 143/71  Pulse: 94  83  Resp: 16  16  Temp: 98 F (36.7 C)    TempSrc: Oral    SpO2: 99% 94% 99%   Eyes: PERRL, lids and conjunctivae normal ENMT: Mucous membranes are moist. Posterior pharynx clear of any exudate or lesions.Normal dentition.  Neck: normal, supple, no masses, no thyromegaly Respiratory: clear to auscultation bilaterally, no wheezing, no crackles. Normal respiratory effort. No accessory muscle use.  Cardiovascular: Regular rate and rhythm, no murmurs / rubs / gallops. No extremity edema. 2+ pedal pulses. No carotid bruits.  Abdomen: no tenderness, no masses palpated. No hepatosplenomegaly. Bowel sounds positive.  Musculoskeletal: no clubbing / cyanosis. No joint deformity upper and lower extremities. Good ROM in all other extremities except right lower extremity, no contractures. Normal muscle tone.  Her incision site has minimal erythema but it is nontender and no warmth and intact Steri-Strips.  Passive range of motion was not performed due to new fracture.  Patient was able to move the toes and feet but not leg. Skin: no rashes, lesions, ulcers. No induration Neurologic: CN 2-12 grossly intact. Sensation intact, DTR normal. Strength 5/5 in all 4.  Psychiatric: Normal judgment and insight. Alert and oriented x 3. Normal mood.    Labs on Admission: I have personally reviewed following labs and imaging studies   CBC: Recent Labs  Lab 09/20/18 0335  WBC 11.9*  HGB 10.0*  HCT 31.4*  MCV 95.7  PLT 329   Basic Metabolic Panel: Recent Labs  Lab 09/20/18 0335  NA 140  K 3.4*  CL 107  CO2 26  GLUCOSE 139*  BUN 13  CREATININE 0.70  CALCIUM 8.3*   GFR: Estimated Creatinine Clearance: 77.1 mL/min (by C-G formula based on SCr of 0.7 mg/dL). Liver Function Tests: No results for input(s): AST, ALT, ALKPHOS, BILITOT, PROT, ALBUMIN in the last 168 hours. No results for input(s): LIPASE, AMYLASE in the last 168 hours. No results for input(s): AMMONIA in the last 168 hours. Coagulation Profile: No results for input(s): INR, PROTIME in the last 168 hours. Cardiac Enzymes: No results for input(s): CKTOTAL, CKMB, CKMBINDEX, TROPONINI in the last 168 hours. BNP (last 3 results) No results for input(s): PROBNP in the last 8760 hours. HbA1C: No results for input(s): HGBA1C  in the last 72 hours. CBG: No results for input(s): GLUCAP in the last 168 hours. Lipid Profile: No results for input(s): CHOL, HDL, LDLCALC, TRIG, CHOLHDL, LDLDIRECT in the last 72 hours. Thyroid Function Tests: No results for input(s): TSH, T4TOTAL, FREET4, T3FREE, THYROIDAB in the last 72 hours. Anemia Panel: No results for input(s): VITAMINB12, FOLATE, FERRITIN, TIBC, IRON, RETICCTPCT in the last 72 hours. Urine analysis:    Component Value Date/Time   COLORURINE YELLOW 07/12/2015 [place] 07/12/2015 1234   LABSPEC 1.016 07/12/2015 1234   PHURINE 5.5 07/12/2015 1234   GLUCOSEU NEGATIVE 07/12/2015 1234   HGBUR NEGATIVE 07/12/2015 [place] 07/12/2015 [place] 07/12/2015 1234   PROTEINUR NEGATIVE 07/12/2015 1234   UROBILINOGEN 1.0 12/03/2011 1403   NITRITE NEGATIVE 07/12/2015 [place] 07/12/2015 1234    Radiological Exams on Admission: Ct Hip Right Wo Contrast  Result Date: 09/24/2018 CLINICAL DATA:  Felt a pop in the right hip while  walking. Recent right hip replacement 5 days ago. EXAM: CT OF THE RIGHT HIP WITHOUT CONTRAST TECHNIQUE: Multidetector CT imaging of the right hip was performed according to the standard protocol. Multiplanar CT image reconstructions were also generated. COMPARISON:  Pelvic x-rays from same day and Sep 19, 2018. FINDINGS: Bones/Joint/Cartilage Prior right total hip arthroplasty. There is an acute nondisplaced periprosthetic fracture of the greater trochanter. No dislocation. No large joint effusion. Chronic subchondral cystic change in the acetabulum. Ligaments Suboptimally assessed by CT. Muscles and Tendons Grossly intact. Soft tissues Small amount of soft tissue edema with a few foci of subcutaneous emphysema in the anterior right hip, likely postsurgical. No fluid collection or soft tissue mass. IMPRESSION: 1. Prior right total hip arthroplasty with acute nondisplaced periprosthetic fracture of the greater trochanter. Electronically Signed   By: Titus Dubin M.D.   On: 09/24/2018 17:14   Dg Hip Unilat W Or Wo Pelvis 2-3 Views Right  Result Date: 09/24/2018 CLINICAL DATA:  Hip replacement Sep 19, 2018. Pop while walking. Unable to move right leg. EXAM: DG HIP (WITH OR WITHOUT PELVIS) 2-3V RIGHT COMPARISON:  None. FINDINGS: There is no evidence of hip fracture or dislocation. There is no evidence of arthropathy or other focal bone abnormality. IMPRESSION: Negative. Electronically Signed   By: Dorise Bullion III M.D   On: 09/24/2018 14:19    EKG: Independently reviewed.   Assessment/Plan Active Problems:   Hip fracture requiring operative repair, right, closed, with delayed healing, subsequent encounter   Essential hypertension   Acute nondisplaced periprosthetic fracture of greater trochanter: Patient was already seen by nurse practitioner from orthopedics.  According to her, Dr. Wynelle Link is not available and that he will be back on Monday morning and will likely perform a surgery on her and in the  meantime we will provide supportive care with IV fluids and pain management and strict bedrest.  Essential hypertension: Controlled.  Resume home medications and monitor.  DVT prophylaxis: Lovenox Code Status: Full code Family Communication: Cussed with patient.  No family present. Disposition Plan: Admit to MedSurg floor as inpatient. Consults called: Orthopedics Admission status: Inpatient   Darliss Cheney MD [facility] Pager [phone]  If 7PM-7AM, please contact night-coverage [email or web address] Password [facility]  09/24/2018, 5:53 PM

## 2018-09-24 NOTE — ED Notes (Signed)
ED TO INPATIENT HANDOFF REPORT  ED Nurse Name and Phone #: [phone]  S Name/Age/Gender Christina Zhang 73 y.o. female Room/Bed: WA07/WA07  Code Status   Code Status: Prior  Home/SNF/Other Home Patient oriented to: self, place, time and situation Is this baseline? Yes   Triage Complete: Triage complete  Chief Complaint Hip deformity, hx of replacement   Triage Note Patient presented to ed with c/o right hip dislocation. Patient state she had hip replacement done 09/19/18. Patient state she was walking and she heard it pop.    Allergies Allergies  Allergen Reactions  . Hydrocodone Nausea And Vomiting    vicodin  . Metronidazole Swelling    lips swelled  . Oxycodone Other (See Comments)    "MADE MY HEART RACE"  . Tramadol Nausea And Vomiting  . Ciprofloxacin Rash    Level of Care/Admitting Diagnosis ED Disposition    ED Disposition Condition [facility] [facility] Area: [place] [[id number]]  Level of Care: Med-Surg [16]  Covid Evaluation: N/A  Diagnosis: Hip fracture requiring operative repair, right, closed, with delayed healing, subsequent encounter [phone]  Admitting Physician: Darliss Cheney [[id number]]  Attending Physician: Darliss Cheney [phone]  Estimated length of stay: 3 - 4 days  Certification:: I certify this patient will need inpatient services for at least 2 midnights  PT Class (Do Not Modify): Inpatient [101]  PT Acc Code (Do Not Modify): Private [1]       B Medical/Surgery History Past Medical History:  Diagnosis Date  . Depression   . Diverticulosis   . Hypertension   . OA (osteoarthritis)    left hip  . PONV (postoperative nausea and vomiting)    after ortoscopic knee surgery yrs ago  . Wears glasses    Past Surgical History:  Procedure Laterality Date  . CARPAL TUNNEL RELEASE Right early 2000s  . DILATION AND CURETTAGE OF UTERUS  1977  . KNEE ARTHROSCOPY Right 2007 approx.  Marland Kitchen TOTAL HIP ARTHROPLASTY Left  06/30/2017   Procedure: TOTAL LEFT HIP ARTHROPLASTY ANTERIOR APPROACH;  Surgeon: Gaynelle Arabian, MD;  Location: [facility];  Service: Orthopedics;  Laterality: Left;  . TOTAL HIP ARTHROPLASTY Right 09/19/2018   Procedure: TOTAL HIP ARTHROPLASTY ANTERIOR APPROACH;  Surgeon: Gaynelle Arabian, MD;  Location: [facility];  Service: Orthopedics;  Laterality: Right;  . TOTAL KNEE ARTHROPLASTY  12/09/2011   Procedure: TOTAL KNEE ARTHROPLASTY;  Surgeon: Gearlean Alf, MD;  Location: [facility];  Service: Orthopedics;  Laterality: Left;  . TOTAL KNEE ARTHROPLASTY Right 07/22/2015   Procedure: RIGHT TOTAL KNEE ARTHROPLASTY;  Surgeon: Gaynelle Arabian, MD;  Location: [facility];  Service: Orthopedics;  Laterality: Right;  . TUBAL LIGATION Bilateral 1977     A IV Location/Drains/Wounds Patient Lines/Drains/Airways Status   Active Line/Drains/Airways    Name:   Placement date:   Placement time:   Site:   Days:   Peripheral IV 09/24/18 Right Antecubital   09/24/18    1819    Antecubital   less than 1   Incision (Closed) 09/19/18 Hip Right   09/19/18    1105     5          Intake/Output Last 24 hours No intake or output data in the 24 hours ending 09/24/18 1843  Labs/Imaging Results for orders placed or performed during the hospital encounter of 09/24/18 (from the past 48 hour(s))  CBC with Differential     Status: Abnormal   Collection Time: 09/24/18  5:26 PM  Result Value Ref Range   WBC 9.7 4.0 - 10.5 K/uL   RBC 3.45 (L) 3.87 - 5.11 MIL/uL   Hemoglobin 10.7 (L) 12.0 - 15.0 g/dL   HCT 32.7 (L) 36.0 - 46.0 %   MCV 94.8 80.0 - 100.0 fL   MCH 31.0 26.0 - 34.0 pg   MCHC 32.7 30.0 - 36.0 g/dL   RDW 12.4 11.5 - 15.5 %   Platelets 458 (H) 150 - 400 K/uL   nRBC 0.0 0.0 - 0.2 %   Neutrophils Relative % 64 %   Neutro Abs 6.2 1.7 - 7.7 K/uL   Lymphocytes Relative 24 %   Lymphs Abs 2.3 0.7 - 4.0 K/uL   Monocytes Relative 10 %   Monocytes Absolute 0.9 0.1 - 1.0 K/uL   Eosinophils Relative 1 %   Eosinophils Absolute  0.1 0.0 - 0.5 K/uL   Basophils Relative 0 %   Basophils Absolute 0.0 0.0 - 0.1 K/uL   Immature Granulocytes 1 %   Abs Immature Granulocytes 0.11 (H) 0.00 - 0.07 K/uL    Comment: Performed at [facility], [place] [address] [place], [place] [address]   Ct Hip Right Wo Contrast  Result Date: 09/24/2018 CLINICAL DATA:  Felt a pop in the right hip while walking. Recent right hip replacement 5 days ago. EXAM: CT OF THE RIGHT HIP WITHOUT CONTRAST TECHNIQUE: Multidetector CT imaging of the right hip was performed according to the standard protocol. Multiplanar CT image reconstructions were also generated. COMPARISON:  Pelvic x-rays from same day and Sep 19, 2018. FINDINGS: Bones/Joint/Cartilage Prior right total hip arthroplasty. There is an acute nondisplaced periprosthetic fracture of the greater trochanter. No dislocation. No large joint effusion. Chronic subchondral cystic change in the acetabulum. Ligaments Suboptimally assessed by CT. Muscles and Tendons Grossly intact. Soft tissues Small amount of soft tissue edema with a few foci of subcutaneous emphysema in the anterior right hip, likely postsurgical. No fluid collection or soft tissue mass. IMPRESSION: 1. Prior right total hip arthroplasty with acute nondisplaced periprosthetic fracture of the greater trochanter. Electronically Signed   By: Titus Dubin M.D.   On: 09/24/2018 17:14   Dg Chest Port 1 View  Result Date: 09/24/2018 CLINICAL DATA:  Preoperative imaging. EXAM: PORTABLE CHEST 1 VIEW COMPARISON:  12/03/2011 FINDINGS: Lungs are clear. Heart and mediastinum are within normal limits. Trachea is midline. Negative for a pneumothorax. No acute bone abnormality. Degenerative changes at the right [facility] joint. IMPRESSION: No acute cardiopulmonary disease. Electronically Signed   By: Markus Daft M.D.   On: 09/24/2018 17:57   Dg Hip Unilat W Or Wo Pelvis 2-3 Views Right  Result Date: 09/24/2018 CLINICAL DATA:  Hip replacement Sep 19, 2018. Pop while walking. Unable to move right leg. EXAM: DG HIP (WITH OR WITHOUT PELVIS) 2-3V RIGHT COMPARISON:  None. FINDINGS: There is no evidence of hip fracture or dislocation. There is no evidence of arthropathy or other focal bone abnormality. IMPRESSION: Negative. Electronically Signed   By: Dorise Bullion III M.D   On: 09/24/2018 14:19    Pending Labs Unresulted Labs (From admission, onward)    Start     Ordered   09/24/18 3149  Basic metabolic panel  Once,   STAT     09/24/18 1728   09/24/18 1725  SARS Coronavirus 2 (CEPHEID - Performed in [place] hospital lab), [place]  (Asymptomatic Patients Labs)  Once,   R    Question:  Rule Out  Answer:  Yes   09/24/18 1728   Signed and Held  HIV antibody (Routine Testing)  Once,   R     Signed and Held   Signed and Held  CBC  (enoxaparin (LOVENOX)    CrCl >/= 30 ml/min)  Once,   R    Comments:  Baseline for enoxaparin therapy IF NOT ALREADY DRAWN.  Notify MD if PLT < 100 K.    Signed and Held   Signed and Held  Creatinine, serum  (enoxaparin (LOVENOX)    CrCl >/= 30 ml/min)  Once,   R    Comments:  Baseline for enoxaparin therapy IF NOT ALREADY DRAWN.    Signed and Held   Signed and Held  Creatinine, serum  (enoxaparin (LOVENOX)    CrCl >/= 30 ml/min)  Weekly,   R    Comments:  while on enoxaparin therapy    Signed and Held   Signed and Held  Comprehensive metabolic panel  Tomorrow morning,   R     Signed and Held   Signed and Held  CBC  Tomorrow morning,   R     Signed and Held          Vitals/Pain Today's Vitals   09/24/18 1524 09/24/18 1620 09/24/18 1817 09/24/18 1818  BP:  (!) 143/71  (!) 147/63  Pulse:  83  80  Resp:  16  13  Temp:      TempSrc:      SpO2:  99%  96%  PainSc: 4   2      Isolation Precautions No active isolations  Medications Medications  aspirin tablet 325 mg (has no administration in time range)  HYDROmorphone (DILAUDID) tablet 2-4 mg (has no administration in time range)  diazepam  (VALIUM) tablet 5 mg (5 mg Oral Given 09/24/18 1524)  HYDROmorphone (DILAUDID) tablet 2 mg (2 mg Oral Given 09/24/18 1532)    Mobility walks with device Moderate fall risk   Focused Assessments othro   R Recommendations: See Admitting Provider Note  Report given to: Terri  Additional Notes: none

## 2018-09-24 NOTE — ED Provider Notes (Addendum)
[place] DEPT Provider Note   CSN: [id number] Arrival date & time: 09/24/18  1240    History   Chief Complaint No chief complaint on file.   HPI Christina Zhang is a 73 y.o. female with recent total right hip arthroplasty anterior approach on 5/11 by Dr Wynelle Link presents for evaluation of sudden onset, severe right anterior hip pain that radiates to the right proximal thigh.  Patient tried to stand up from bed today and took a step forward with her right foot when she felt the sudden pull.  She was unable to move and her daughter placed a chair behind her to sit.  EMS was called.  She has not ambulated since.  Since the surgery she has been feeling well, doing her exercises and only taking Dilaudid 2 mg at nighttime because she has felt like her pain is under good control without it.  Denies any recent new exercises, trauma, falls or over exertional activity that would have exacerbated pain.  Incision is minimally tender without redness, warmth, discharge.  No associated fevers.  Has had intermittent tingling to the anterior thigh since the surgery.  No distal right leg swelling, numbness.  No associated back pain.  She called [facility] hotline who advised her to come to the ED.     HPI  Past Medical History:  Diagnosis Date  . Depression   . Diverticulosis   . Hypertension   . OA (osteoarthritis)    left hip  . PONV (postoperative nausea and vomiting)    after ortoscopic knee surgery yrs ago  . Wears glasses     Patient Active Problem List   Diagnosis Date Noted  . Hip fracture requiring operative repair, right, closed, with delayed healing, subsequent encounter 09/24/2018  . Essential hypertension 09/24/2018  . OA (osteoarthritis) of hip 06/30/2017  . Postop Hypokalemia 12/11/2011  . OA (osteoarthritis) of knee 12/09/2011    Past Surgical History:  Procedure Laterality Date  . CARPAL TUNNEL RELEASE Right early 2000s  . DILATION AND  CURETTAGE OF UTERUS  1977  . KNEE ARTHROSCOPY Right 2007 approx.  Marland Kitchen TOTAL HIP ARTHROPLASTY Left 06/30/2017   Procedure: TOTAL LEFT HIP ARTHROPLASTY ANTERIOR APPROACH;  Surgeon: Gaynelle Arabian, MD;  Location: [facility];  Service: Orthopedics;  Laterality: Left;  . TOTAL HIP ARTHROPLASTY Right 09/19/2018   Procedure: TOTAL HIP ARTHROPLASTY ANTERIOR APPROACH;  Surgeon: Gaynelle Arabian, MD;  Location: [facility];  Service: Orthopedics;  Laterality: Right;  . TOTAL KNEE ARTHROPLASTY  12/09/2011   Procedure: TOTAL KNEE ARTHROPLASTY;  Surgeon: Gearlean Alf, MD;  Location: [facility];  Service: Orthopedics;  Laterality: Left;  . TOTAL KNEE ARTHROPLASTY Right 07/22/2015   Procedure: RIGHT TOTAL KNEE ARTHROPLASTY;  Surgeon: Gaynelle Arabian, MD;  Location: [facility];  Service: Orthopedics;  Laterality: Right;  . TUBAL LIGATION Bilateral 1977     OB History   No obstetric history on file.      Home Medications    Prior to Admission medications   Medication Sig Start Date End Date Taking? Authorizing Provider  aspirin EC 325 MG EC tablet Take 1 tablet (325 mg total) by mouth 2 (two) times daily for 20 days. Then take one 81 mg aspirin once a day for three weeks. Then discontinue aspirin. 09/20/18 10/10/18 Yes Edmisten, Kristie L, PA  cholecalciferol (VITAMIN D3) 25 MCG (1000 UT) tablet Take 1,000 Units by mouth daily.   Yes [provider]  hydrochlorothiazide (HYDRODIURIL) 25 MG tablet Take 12.5  mg by mouth every morning.    Yes [provider]  HYDROmorphone (DILAUDID) 2 MG tablet Take 1-2 tablets (2-4 mg total) by mouth every 6 (six) hours as needed for moderate pain. 09/20/18  Yes Edmisten, Kristie L, PA  loratadine (CLARITIN) 10 MG tablet Take 10 mg by mouth daily as needed for allergies.   Yes [provider]  methocarbamol (ROBAXIN) 500 MG tablet Take 1 tablet (500 mg total) by mouth every 6 (six) hours as needed for muscle spasms. 09/20/18  Yes Edmisten, Kristie L, PA  sertraline (ZOLOFT)  100 MG tablet Take 100 mg by mouth daily.   Yes [provider]    Family History No family history on file.  Social History Social History   Tobacco Use  . Smoking status: Never Smoker  . Smokeless tobacco: Never Used  Substance Use Topics  . Alcohol use: Yes    Comment: occ wine  . Drug use: No     Allergies   Hydrocodone; Metronidazole; Oxycodone; Tramadol; and Ciprofloxacin   Review of Systems Review of Systems  Musculoskeletal: Positive for arthralgias and gait problem.  All other systems reviewed and are negative.    Physical Exam Updated Vital Signs BP (!) 141/62 (BP Location: Left Arm)   Pulse 80   Temp 98.4 F (36.9 C) (Oral)   Resp 14   Ht 5\' 6"  (1.676 m)   Wt 98.5 kg   SpO2 99%   BMI 35.05 kg/m   Physical Exam Vitals signs and nursing note reviewed.  Constitutional:      General: She is not in acute distress.    Appearance: She is well-developed.     Comments: NAD.  HENT:     Head: Normocephalic and atraumatic.     Right Ear: External ear normal.     Left Ear: External ear normal.     Nose: Nose normal.  Eyes:     General: No scleral icterus.    Conjunctiva/sclera: Conjunctivae normal.  Neck:     Musculoskeletal: Normal range of motion and neck supple.  Cardiovascular:     Rate and Rhythm: Normal rate and regular rhythm.     Heart sounds: Normal heart sounds.     Comments: 1+ DP pulses bilaterally.  No lower extremity edema.  No calf tenderness. Pulmonary:     Effort: Pulmonary effort is normal.     Breath sounds: Normal breath sounds.  Musculoskeletal: Normal range of motion.        General: Tenderness present.     Comments: L-spine: No midline or paraspinal muscle tenderness.  No SI joint tenderness.  No sciatic notch tenderness. Pelvis: No instability with AP/lateral compression.  Mild TTP directly over the surgical incision in the right inguinal crease.  TTP to the right inguinal crease.  TTP to the anterior proximal thigh.   Patient did not tolerate passive ROM of the right hip, exquisite pain with straight leg IR/ER. Right lower extremity: Right knee without focal tenderness to bony prominences, edema.  Right ankle is normal with full ROM, painless.  Skin:    General: Skin is warm and dry.     Capillary Refill: Capillary refill takes less than 2 seconds.  Neurological:     Mental Status: She is alert and oriented to person, place, and time.     Comments: Sensation intact in the distal RLE.  5/5 strength with ankle dorsiflexion/plantarflexion.  Psychiatric:        Behavior: Behavior normal.  Thought Content: Thought content normal.        Judgment: Judgment normal.      ED Treatments / Results  Labs (all labs ordered are listed, but only abnormal results are displayed) Labs Reviewed  CBC WITH DIFFERENTIAL/PLATELET - Abnormal; Notable for the following components:      Result Value   RBC 3.45 (*)    Hemoglobin 10.7 (*)    HCT 32.7 (*)    Platelets 458 (*)    Abs Immature Granulocytes 0.11 (*)    All other components within normal limits  BASIC METABOLIC PANEL - Abnormal; Notable for the following components:   Potassium 3.4 (*)    Calcium 8.8 (*)    All other components within normal limits  SARS CORONAVIRUS 2 (HOSPITAL ORDER, [facility])  HIV ANTIBODY (ROUTINE TESTING W REFLEX)  CBC  CREATININE, SERUM  COMPREHENSIVE METABOLIC PANEL  CBC    EKG None  Radiology Ct Hip Right Wo Contrast  Result Date: 09/24/2018 CLINICAL DATA:  Felt a pop in the right hip while walking. Recent right hip replacement 5 days ago. EXAM: CT OF THE RIGHT HIP WITHOUT CONTRAST TECHNIQUE: Multidetector CT imaging of the right hip was performed according to the standard protocol. Multiplanar CT image reconstructions were also generated. COMPARISON:  Pelvic x-rays from same day and Sep 19, 2018. FINDINGS: Bones/Joint/Cartilage Prior right total hip arthroplasty. There is an acute  nondisplaced periprosthetic fracture of the greater trochanter. No dislocation. No large joint effusion. Chronic subchondral cystic change in the acetabulum. Ligaments Suboptimally assessed by CT. Muscles and Tendons Grossly intact. Soft tissues Small amount of soft tissue edema with a few foci of subcutaneous emphysema in the anterior right hip, likely postsurgical. No fluid collection or soft tissue mass. IMPRESSION: 1. Prior right total hip arthroplasty with acute nondisplaced periprosthetic fracture of the greater trochanter. Electronically Signed   By: Titus Dubin M.D.   On: 09/24/2018 17:14   Dg Chest Port 1 View  Result Date: 09/24/2018 CLINICAL DATA:  Preoperative imaging. EXAM: PORTABLE CHEST 1 VIEW COMPARISON:  12/03/2011 FINDINGS: Lungs are clear. Heart and mediastinum are within normal limits. Trachea is midline. Negative for a pneumothorax. No acute bone abnormality. Degenerative changes at the right [facility] joint. IMPRESSION: No acute cardiopulmonary disease. Electronically Signed   By: Markus Daft M.D.   On: 09/24/2018 17:57   Dg Hip Unilat W Or Wo Pelvis 2-3 Views Right  Result Date: 09/24/2018 CLINICAL DATA:  Hip replacement Sep 19, 2018. Pop while walking. Unable to move right leg. EXAM: DG HIP (WITH OR WITHOUT PELVIS) 2-3V RIGHT COMPARISON:  None. FINDINGS: There is no evidence of hip fracture or dislocation. There is no evidence of arthropathy or other focal bone abnormality. IMPRESSION: Negative. Electronically Signed   By: Dorise Bullion III M.D   On: 09/24/2018 14:19    Procedures Procedures (including critical care time)  Medications Ordered in ED Medications  hydrochlorothiazide (HYDRODIURIL) tablet 12.5 mg (has no administration in time range)  sertraline (ZOLOFT) tablet 100 mg (has no administration in time range)  methocarbamol (ROBAXIN) tablet 500 mg (has no administration in time range)  enoxaparin (LOVENOX) injection 40 mg (has no administration in time range)   sodium chloride flush (NS) 0.9 % injection 3 mL (has no administration in time range)  acetaminophen (TYLENOL) tablet 650 mg (has no administration in time range)    Or  acetaminophen (TYLENOL) suppository 650 mg (has no administration in time range)  HYDROcodone-acetaminophen (NORCO/VICODIN)  5-325 MG per tablet 1-2 tablet (has no administration in time range)  polyethylene glycol (MIRALAX / GLYCOLAX) packet 17 g (has no administration in time range)  aspirin tablet 325 mg (has no administration in time range)  HYDROmorphone (DILAUDID) tablet 2-4 mg (has no administration in time range)  diazepam (VALIUM) tablet 5 mg (5 mg Oral Given 09/24/18 1524)  HYDROmorphone (DILAUDID) tablet 2 mg (2 mg Oral Given 09/24/18 1532)     Initial Impression / Assessment and Plan / ED Course  I have reviewed the triage vital signs and the nursing notes.  Pertinent labs & imaging results that were available during my care of the patient were reviewed by me and considered in my medical decision making (see chart for details).  Clinical Course as of Sep 23 1948  Sat Sep 24, 2018  1519 Spoke to Dr Lyla Glassing who is in the OR. States if CT is negative pt may be discharged with partial weight bearing with walking at home and office f/u.    [CG]  1720 CT scan with nondisplaced fracture of the greater trochanter; Dr. Venora Maples discussed case with Dr. Lyla Glassing who requested to view CT results when they return; will operate on Monday 5/18 and someone from his team will evaluate pt today. Will consult hospitalist for admission   [MV]  1720 Discussed case with hospitalist Dr. Doristine Bosworth who    [MV]    Clinical Course User Index [CG] Kinnie Feil, PA-C [MV] Eustaquio Maize, PA-C      No significant preceding trauma, fall, exertional activity however given recent surgery, age and likely poor bone quality there could be occult injury.  X-ray is negative.  I attempted to ambulate patient, she could not lift leg off bed,  drag leg up on bed due to significant pain with good effort.  No signs of incision dehiscence or infection. Distal RLE is NVI. No signs of DVT and this is unlikely. Will page [facility] but anticipate CT for further evaluation of occult injury.  Oral dilaudid/valium given for symptom control.  Discussed with ED MD.   Final Clinical Impressions(s) / ED Diagnoses   1515: Pt handed off to oncoming EDPA who will f/u on CT and determine disposition. Anticipate dc if symptoms well controlled, ambulatory at baseline and unremarkable imaging. Patient updated.  Final diagnoses:  Right hip pain  History of total right hip replacement  Periprosthetic fracture around internal prosthetic right hip joint, initial encounter [facility])    ED Discharge Orders    None        Kinnie Feil, PA-C 09/24/18 1521    Kinnie Feil, PA-C 09/24/18 1950    Julianne Rice, MD 09/26/18 [phone]

## 2018-09-24 NOTE — ED Triage Notes (Signed)
Patient presented to ed with c/o right hip dislocation. Patient state she had hip replacement done 09/19/18. Patient state she was walking and she heard it pop.

## 2018-09-24 NOTE — Consult Note (Signed)
ORTHOPAEDIC CONSULTATION  REQUESTING PHYSICIAN: Darliss Cheney, MD  PCP:  Caryl Bis, MD  Chief Complaint: Right hip pain, s/p right total hip arthroplasty  HPI: Christina Zhang is a 73 y.o. female who complains of increased right hip pain, 5 days s/p right total hip replacement by Dr. Wynelle Link. She was discharged home on 09/20/18, and states she was doing well. She reports that she was doing her HEP without much difficulty. Today, she was getting up from the bed, took a step forward, and subsequently felt significant pain in the right hip. She states she was unable to move, and her daughter called EMS. She denies injury or fall. Orthopedics was consulted. In the ED, plain radiographs showed no evidence of hip fracture or dislocation. CT was obtained which was read as prior right total hip arthroplasty with acute nondisplaced periprosthetic fracture of the greater trochanter. She is taking ASA 325 BID, but has not had a dose today.   Past Medical History:  Diagnosis Date  . Depression   . Diverticulosis   . Hypertension   . OA (osteoarthritis)    left hip  . PONV (postoperative nausea and vomiting)    after ortoscopic knee surgery yrs ago  . Wears glasses    Past Surgical History:  Procedure Laterality Date  . CARPAL TUNNEL RELEASE Right early 2000s  . DILATION AND CURETTAGE OF UTERUS  1977  . KNEE ARTHROSCOPY Right 2007 approx.  Marland Kitchen TOTAL HIP ARTHROPLASTY Left 06/30/2017   Procedure: TOTAL LEFT HIP ARTHROPLASTY ANTERIOR APPROACH;  Surgeon: Gaynelle Arabian, MD;  Location: [facility];  Service: Orthopedics;  Laterality: Left;  . TOTAL HIP ARTHROPLASTY Right 09/19/2018   Procedure: TOTAL HIP ARTHROPLASTY ANTERIOR APPROACH;  Surgeon: Gaynelle Arabian, MD;  Location: [facility];  Service: Orthopedics;  Laterality: Right;  . TOTAL KNEE ARTHROPLASTY  12/09/2011   Procedure: TOTAL KNEE ARTHROPLASTY;  Surgeon: Gearlean Alf, MD;  Location: [facility];  Service: Orthopedics;  Laterality: Left;  . TOTAL  KNEE ARTHROPLASTY Right 07/22/2015   Procedure: RIGHT TOTAL KNEE ARTHROPLASTY;  Surgeon: Gaynelle Arabian, MD;  Location: [facility];  Service: Orthopedics;  Laterality: Right;  . TUBAL LIGATION Bilateral 1977   Social History   Socioeconomic History  . Marital status: Widowed    Spouse name: Not on file  . Number of children: Not on file  . Years of education: Not on file  . Highest education level: Not on file  Occupational History  . Not on file  Social Needs  . Financial resource strain: Not on file  . Food insecurity:    Worry: Not on file    Inability: Not on file  . Transportation needs:    Medical: Not on file    Non-medical: Not on file  Tobacco Use  . Smoking status: Never Smoker  . Smokeless tobacco: Never Used  Substance and Sexual Activity  . Alcohol use: Yes    Comment: occ wine  . Drug use: No  . Sexual activity: Not on file  Lifestyle  . Physical activity:    Days per week: Not on file    Minutes per session: Not on file  . Stress: Not on file  Relationships  . Social connections:    Talks on phone: Not on file    Gets together: Not on file    Attends religious service: Not on file    Active member of club or organization: Not on file    Attends meetings of clubs or  organizations: Not on file    Relationship status: Not on file  Other Topics Concern  . Not on file  Social History Narrative  . Not on file   No family history on file. Allergies  Allergen Reactions  . Hydrocodone Nausea And Vomiting    vicodin  . Metronidazole Swelling    lips swelled  . Oxycodone Other (See Comments)    "MADE MY HEART RACE"  . Tramadol Nausea And Vomiting  . Ciprofloxacin Rash   Prior to Admission medications   Medication Sig Start Date End Date Taking? Authorizing Provider  aspirin EC 325 MG EC tablet Take 1 tablet (325 mg total) by mouth 2 (two) times daily for 20 days. Then take one 81 mg aspirin once a day for three weeks. Then discontinue aspirin. 09/20/18  10/10/18 Yes Edmisten, Kristie L, PA  cholecalciferol (VITAMIN D3) 25 MCG (1000 UT) tablet Take 1,000 Units by mouth daily.   Yes [provider]  hydrochlorothiazide (HYDRODIURIL) 25 MG tablet Take 12.5 mg by mouth every morning.    Yes [provider]  HYDROmorphone (DILAUDID) 2 MG tablet Take 1-2 tablets (2-4 mg total) by mouth every 6 (six) hours as needed for moderate pain. 09/20/18  Yes Edmisten, Kristie L, PA  loratadine (CLARITIN) 10 MG tablet Take 10 mg by mouth daily as needed for allergies.   Yes [provider]  methocarbamol (ROBAXIN) 500 MG tablet Take 1 tablet (500 mg total) by mouth every 6 (six) hours as needed for muscle spasms. 09/20/18  Yes Edmisten, Kristie L, PA  sertraline (ZOLOFT) 100 MG tablet Take 100 mg by mouth daily.   Yes [provider]   Ct Hip Right Wo Contrast  Result Date: 09/24/2018 CLINICAL DATA:  Felt a pop in the right hip while walking. Recent right hip replacement 5 days ago. EXAM: CT OF THE RIGHT HIP WITHOUT CONTRAST TECHNIQUE: Multidetector CT imaging of the right hip was performed according to the standard protocol. Multiplanar CT image reconstructions were also generated. COMPARISON:  Pelvic x-rays from same day and Sep 19, 2018. FINDINGS: Bones/Joint/Cartilage Prior right total hip arthroplasty. There is an acute nondisplaced periprosthetic fracture of the greater trochanter. No dislocation. No large joint effusion. Chronic subchondral cystic change in the acetabulum. Ligaments Suboptimally assessed by CT. Muscles and Tendons Grossly intact. Soft tissues Small amount of soft tissue edema with a few foci of subcutaneous emphysema in the anterior right hip, likely postsurgical. No fluid collection or soft tissue mass. IMPRESSION: 1. Prior right total hip arthroplasty with acute nondisplaced periprosthetic fracture of the greater trochanter. Electronically Signed   By: Titus Dubin M.D.   On: 09/24/2018 17:14   Dg Hip Unilat W  Or Wo Pelvis 2-3 Views Right  Result Date: 09/24/2018 CLINICAL DATA:  Hip replacement Sep 19, 2018. Pop while walking. Unable to move right leg. EXAM: DG HIP (WITH OR WITHOUT PELVIS) 2-3V RIGHT COMPARISON:  None. FINDINGS: There is no evidence of hip fracture or dislocation. There is no evidence of arthropathy or other focal bone abnormality. IMPRESSION: Negative. Electronically Signed   By: Dorise Bullion III M.D   On: 09/24/2018 14:19    Positive ROS: All other systems have been reviewed and were otherwise negative with the exception of those mentioned in the HPI and as above.  Physical Exam: General: Alert, no acute distress Cardiovascular: No pedal edema Respiratory: No cyanosis, no use of accessory musculature Skin: No lesions in the area of chief complaint Neurologic: Sensation intact distally  Psychiatric: Patient is competent for consent with normal mood and affect  MUSCULOSKELETAL:  Right hip:  Right hip arthroplasty incision present with steri-strips in place. Skin intact, no drainage. Slight bruising surrounding the incision. Minimal tenderness to palpation about the hip. ROM limited by pain. Patient is able to flex and dorsiflex the ankle. Distal pulses and sensation intact to the RLE.   Assessment: Right hip acute nondisplaced periprosthetic fracture of the greater trochanter, s/p right total hip arthroplasty   Plan: Discussed the case with Dr. Wynelle Link, who plans to see the patient on Monday to determine treatment plan. Hospitalist service is admitting the patient for pain control. We will order PT for TDWB to the RLE. Patient may continue on Aspirin 325 BID, but hold Monday morning dose. She will be NPO on Sunday at midnight in case of surgical intervention on Monday. Spoke with daughter, Cannon Kettle. She is a PT, and lives in [place]. She had concerns about patient's disposition at discharge, as she will be home alone. We will update her again on Monday with the plan.     Maurice March, PA-C Cell [phone]    09/24/2018 5:54 PM

## 2018-09-24 NOTE — ED Provider Notes (Signed)
Care assumed from Carmon Sails, [place], at shift change, please see their notes for full documentation of patient's complaint/HPI. Briefly, pt here with sudden onset of right hip pain that began today; pt had total hip arthroplasty done on 05/11 by Dr. Edmonia Lynch with [facility]. Results so far show negative xray of right hip. Awaiting CT Hip. Rosemarie Ax has discussed case with Dr. Sid Falcon nurse who suggests that pt can follow up outpatient if CT negative with partial weight bearing with walker and pain control.   Physical Exam  BP (!) 176/96   Pulse 94   Temp 98 F (36.7 C) (Oral)   Resp 16   SpO2 94%   Physical Exam Vitals signs and nursing note reviewed.  Constitutional:      Appearance: She is not ill-appearing.  HENT:     Head: Normocephalic and atraumatic.  Eyes:     Conjunctiva/sclera: Conjunctivae normal.  Cardiovascular:     Rate and Rhythm: Normal rate and regular rhythm.  Pulmonary:     Effort: Pulmonary effort is normal.     Breath sounds: Normal breath sounds.  Musculoskeletal:     Comments: Tenderness to palpation along incision to right hip  Skin:    General: Skin is warm and dry.     Coloration: Skin is not jaundiced.  Neurological:     Mental Status: She is alert.     ED Course/Procedures   Clinical Course as of Sep 23 1805  Sat Sep 24, 2018  1519 Spoke to Dr Lyla Glassing who is in the OR. States if CT is negative pt may be discharged with partial weight bearing with walking at home and office f/u.    [CG]  1720 CT scan with nondisplaced fracture of the greater trochanter; Dr. Venora Maples discussed case with Dr. Lyla Glassing who requested to view CT results when they return; will operate on Monday 5/18 and someone from his team will evaluate pt today. Will consult hospitalist for admission   [MV]  1720 Discussed case with hospitalist Dr. Doristine Bosworth who will admit patient. Admitting orders placed as well as covid screening test.  [MV]    Clinical Course User Index [CG]  Kinnie Feil, PA-C [MV] Eustaquio Maize, PA-C    Procedures  MDM Number of Diagnoses or Management Options History of total right hip replacement:  Periprosthetic fracture around internal prosthetic right hip joint, initial encounter [facility]):  Right hip pain:        Eustaquio Maize, PA-C 09/24/18 2127    Isla Pence, MD 09/27/18 1556

## 2018-09-24 NOTE — ED Notes (Signed)
Bed: WA07 Expected date:  Expected time:  Means of arrival:  Comments: 73 yo hip dislocation

## 2018-09-25 ENCOUNTER — Other Ambulatory Visit: Payer: Self-pay

## 2018-09-25 DIAGNOSIS — M9701XA Periprosthetic fracture around internal prosthetic right hip joint, initial encounter: Secondary | ICD-10-CM

## 2018-09-25 DIAGNOSIS — F32A Depression, unspecified: Secondary | ICD-10-CM | POA: Diagnosis present

## 2018-09-25 DIAGNOSIS — F329 Major depressive disorder, single episode, unspecified: Secondary | ICD-10-CM

## 2018-09-25 DIAGNOSIS — D649 Anemia, unspecified: Secondary | ICD-10-CM | POA: Diagnosis present

## 2018-09-25 LAB — COMPREHENSIVE METABOLIC PANEL
ALT: 21 U/L (ref 0–44)
AST: 23 U/L (ref 15–41)
Albumin: 2.9 g/dL — ABNORMAL LOW (ref 3.5–5.0)
Alkaline Phosphatase: 85 U/L (ref 38–126)
Anion gap: 9 (ref 5–15)
BUN: 14 mg/dL (ref 8–23)
CO2: 24 mmol/L (ref 22–32)
Calcium: 8.6 mg/dL — ABNORMAL LOW (ref 8.9–10.3)
Chloride: 106 mmol/L (ref 98–111)
Creatinine, Ser: 0.55 mg/dL (ref 0.44–1.00)
GFR calc Af Amer: >60 mL/min (ref >60)
GFR calc non Af Amer: >60 mL/min (ref >60)
Glucose, Bld: 92 mg/dL (ref 70–99)
Potassium: 3.3 mmol/L — ABNORMAL LOW (ref 3.5–5.1)
Sodium: 139 mmol/L (ref 135–145)
Total Bilirubin: 0.7 mg/dL (ref 0.3–1.2)
Total Protein: 6.2 g/dL — ABNORMAL LOW (ref 6.5–8.1)

## 2018-09-25 LAB — HIV ANTIBODY (ROUTINE TESTING W REFLEX): HIV Screen 4th Generation wRfx: NONREACTIVE

## 2018-09-25 LAB — CBC
HCT: 31 % — ABNORMAL LOW (ref 36.0–46.0)
Hemoglobin: 9.9 g/dL — ABNORMAL LOW (ref 12.0–15.0)
MCH: 30.7 pg (ref 26.0–34.0)
MCHC: 31.9 g/dL (ref 30.0–36.0)
MCV: 96.3 fL (ref 80.0–100.0)
Platelets: 390 10*3/uL (ref 150–400)
RBC: 3.22 MIL/uL — ABNORMAL LOW (ref 3.87–5.11)
RDW: 12.5 % (ref 11.5–15.5)
WBC: 8.6 10*3/uL (ref 4.0–10.5)
nRBC: 0 % (ref 0.0–0.2)

## 2018-09-25 MED ORDER — HYDROMORPHONE HCL 2 MG PO TABS
2.0000 mg | ORAL_TABLET | Freq: Four times a day (QID) | ORAL | Status: DC | PRN
  Administered 2018-09-27: 2 mg via ORAL
  Filled 2018-09-25: qty 1

## 2018-09-25 MED ORDER — ALUM & MAG HYDROXIDE-SIMETH 200-200-20 MG/5ML PO SUSP
30.0000 mL | Freq: Four times a day (QID) | ORAL | Status: DC | PRN
  Administered 2018-09-25 – 2018-09-28 (×2): 30 mL via ORAL
  Filled 2018-09-25 (×2): qty 30

## 2018-09-25 MED ORDER — ASPIRIN 325 MG PO TABS
325.0000 mg | ORAL_TABLET | Freq: Two times a day (BID) | ORAL | Status: DC
  Administered 2018-09-27 – 2018-09-28 (×3): 325 mg via ORAL
  Filled 2018-09-25 (×3): qty 1

## 2018-09-25 MED ORDER — SODIUM CHLORIDE 0.9% FLUSH: 10.0000 mL | INTRAVENOUS | Status: DC | PRN

## 2018-09-25 MED ORDER — POTASSIUM CHLORIDE CRYS ER 20 MEQ PO TBCR
40.0000 meq | EXTENDED_RELEASE_TABLET | Freq: Once | ORAL | Status: AC
  Administered 2018-09-25: 40 meq via ORAL
  Filled 2018-09-25: qty 2

## 2018-09-25 MED ORDER — LIDOCAINE VISCOUS HCL 2 % MT SOLN
15.0000 mL | Freq: Four times a day (QID) | OROMUCOSAL | Status: DC | PRN
  Administered 2018-09-25: 15 mL via ORAL
  Filled 2018-09-25: qty 15

## 2018-09-25 MED ORDER — SODIUM CHLORIDE 0.9 % IV SOLN
INTRAVENOUS | Status: DC
  Administered 2018-09-25 (×2): via INTRAVENOUS

## 2018-09-25 MED ORDER — ASPIRIN 325 MG PO TABS
325.0000 mg | ORAL_TABLET | Freq: Once | ORAL | Status: AC
  Administered 2018-09-25: 325 mg via ORAL
  Filled 2018-09-25: qty 1

## 2018-09-25 NOTE — Progress Notes (Signed)
Patient arrived on the unit at approximately 1923. She is alert and verbally responsive. No complaints voiced at this time. Has dry dressing to right hip incision. NPO after midnight for possible surgery in the morning.

## 2018-09-25 NOTE — Plan of Care (Signed)
  Problem: Health Behavior/Discharge Planning: Goal: Ability to manage health-related needs will improve Outcome: Progressing   Problem: Clinical Measurements: Goal: Ability to maintain clinical measurements within normal limits will improve Outcome: Progressing Goal: Will remain free from infection Outcome: Progressing Goal: Diagnostic test results will improve Outcome: Progressing Goal: Respiratory complications will improve Outcome: Progressing Goal: Cardiovascular complication will be avoided Outcome: Progressing   Problem: Activity: Goal: Risk for activity intolerance will decrease Outcome: Progressing   Problem: Nutrition: Goal: Adequate nutrition will be maintained Outcome: Progressing   Problem: Coping: Goal: Level of anxiety will decrease Outcome: Progressing   Problem: Elimination: Goal: Will not experience complications related to bowel motility Outcome: Progressing Goal: Will not experience complications related to urinary retention Outcome: Progressing   Problem: Pain Managment: Goal: General experience of comfort will improve Outcome: Progressing   Problem: Safety: Goal: Ability to remain free from injury will improve Outcome: Progressing   Problem: Skin Integrity: Goal: Risk for impaired skin integrity will decrease Outcome: Progressing   Problem: Education: Goal: Knowledge of the prescribed therapeutic regimen will improve Outcome: Progressing Goal: Understanding of discharge needs will improve Outcome: Progressing Goal: Individualized Educational Video(s) Outcome: Progressing   Problem: Activity: Goal: Ability to avoid complications of mobility impairment will improve Outcome: Progressing Goal: Ability to tolerate increased activity will improve Outcome: Progressing   Problem: Clinical Measurements: Goal: Postoperative complications will be avoided or minimized Outcome: Progressing   Problem: Pain Management: Goal: Pain level will  decrease with appropriate interventions Outcome: Progressing   Problem: Skin Integrity: Goal: Will show signs of wound healing Outcome: Progressing  Pt progressing as expected.  Home tomorrow

## 2018-09-25 NOTE — Progress Notes (Signed)
PROGRESS NOTE    Christina Zhang  SEG:[id number] DOB: Dec 26, 1945 DOA: 09/24/2018 PCP: Caryl Bis, MD    Brief Narrative:  HPI per Dr. Ardelle Lesches is a 73 y.o. female with medical history significant of hypertension and recent total right hip arthroplasty on 09/19/2018 presented to the emergency department with a complaint of hip pain.  According to patient, she tried to stand up from her bed today and she was fine up until then and then she started hurting her right hip when she started take her foot forward and felt a pull.  She was unable to move her right leg so she was helped out by her daughter nearby.  EMS was called and patient was brought into the emergency department.  Her pain is currently 4 out of 10.  She is comfortable.  Initial x-ray of hip was negative.  Orthopedics was consulted and recommended CT of the hip which revealed acute nondisplaced periprosthetic fracture of the greater trochanter.  Orthopedics recommended admission to hospital service and they will consult.  ED Course: Upon initial presentation, patient was hemodynamically stable.  Labs are still pending.  CT hip shows acute nondisplaced periprosthetic fracture of the right greater trochanter.  Assessment & Plan:   Active Problems:   Hip fracture requiring operative repair, right, closed, with delayed healing, subsequent encounter   Essential hypertension   Depression   Anemia  1 acute nondisplaced periprosthetic fracture of the great trochanter on the right Patient status post recent right hip arthroplasty 09/19/2018.  Patient was assessed PA from orthopedics.  According to PA, Dr. Wynelle Link not available and will be back tomorrow and will assess patient and patient may possibly go to the OR.  Patient to be n.p.o. after midnight in anticipation of possible surgery tomorrow.  Strict bedrest.  Continue pain management.  Placed on gentle IV fluid.  Per orthopedics.  2.  Hypertension Continue home regimen  HCTZ.  Follow.  3.  Depression Stable.  Continue home regimen Zoloft.  4.  Hypokalemia Replete.  5.  Anemia Likely anemia of chronic disease.  Patient with no overt bleeding.  Check an anemia panel.  Follow H&H.  Transfusion threshold hemoglobin less than 7.   DVT prophylaxis: Lovenox Code Status: Full Family Communication: Updated patient.  No family at bedside. Disposition Plan: To be determined.   Consultants:   Orthopedics: Griffith Citron, [place] 09/24/2018  Procedures:   CT right hip 09/24/2018  Chest x-ray 09/24/2018  Plain hips of the right hip and pelvis 09/24/2018  Antimicrobials:   None   Subjective: Patient laying in bed.  Patient with complaints of heartburn.  Patient stated had some soft stools early on.  Denies any chest pain or shortness of breath.  Objective: Vitals:   09/24/18 1947 09/24/18 2216 09/25/18 0528 09/25/18 1310  BP:  132/67 116/65 125/71  Pulse:  82 66 79  Resp:  16 16 17   Temp:  98.9 F (37.2 C) 98 F (36.7 C) 98.3 F (36.8 C)  TempSrc:  Oral Oral Oral  SpO2:  96% 93% 96%  Weight: 98.5 kg     Height: 5\' 6"  (1.676 m)       Intake/Output Summary (Last 24 hours) at 09/25/2018 1747 Last data filed at 09/25/2018 1310 Gross per 24 hour  Intake 270 ml  Output 200 ml  Net 70 ml   Filed Weights   09/24/18 1947  Weight: 98.5 kg    Examination:  General exam: Appears calm and comfortable  Respiratory system: Clear to auscultation. Respiratory effort normal. Cardiovascular system: S1 & S2 heard, RRR. No JVD, murmurs, rubs, gallops or clicks. No pedal edema. Gastrointestinal system: Abdomen is nondistended, soft and nontender. No organomegaly or masses felt. Normal bowel sounds heard. Central nervous system: Alert and oriented. No focal neurological deficits. Extremities: Symmetric 5 x 5 power. Skin: No rashes, lesions or ulcers Psychiatry: Judgement and insight appear normal. Mood & affect appropriate.     Data Reviewed: I have  personally reviewed following labs and imaging studies  CBC: Recent Labs  Lab 09/20/18 0335 09/24/18 1726 09/25/18 0434  WBC 11.9* 9.7 8.6  NEUTROABS  --  6.2  --   HGB 10.0* 10.7* 9.9*  HCT 31.4* 32.7* 31.0*  MCV 95.7 94.8 96.3  PLT 279 458* 580   Basic Metabolic Panel: Recent Labs  Lab 09/20/18 0335 09/24/18 1726 09/25/18 0434  NA 140 138 139  K 3.4* 3.4* 3.3*  CL 107 103 106  CO2 26 25 24   GLUCOSE 139* 98 92  BUN 13 11 14   CREATININE 0.70 0.59 0.55  CALCIUM 8.3* 8.8* 8.6*   GFR: Estimated Creatinine Clearance: 77.5 mL/min (by C-G formula based on SCr of 0.55 mg/dL). Liver Function Tests: Recent Labs  Lab 09/25/18 0434  AST 23  ALT 21  ALKPHOS 85  BILITOT 0.7  PROT 6.2*  ALBUMIN 2.9*   No results for input(s): LIPASE, AMYLASE in the last 168 hours. No results for input(s): AMMONIA in the last 168 hours. Coagulation Profile: No results for input(s): INR, PROTIME in the last 168 hours. Cardiac Enzymes: No results for input(s): CKTOTAL, CKMB, CKMBINDEX, TROPONINI in the last 168 hours. BNP (last 3 results) No results for input(s): PROBNP in the last 8760 hours. HbA1C: No results for input(s): HGBA1C in the last 72 hours. CBG: No results for input(s): GLUCAP in the last 168 hours. Lipid Profile: No results for input(s): CHOL, HDL, LDLCALC, TRIG, CHOLHDL, LDLDIRECT in the last 72 hours. Thyroid Function Tests: No results for input(s): TSH, T4TOTAL, FREET4, T3FREE, THYROIDAB in the last 72 hours. Anemia Panel: No results for input(s): VITAMINB12, FOLATE, FERRITIN, TIBC, IRON, RETICCTPCT in the last 72 hours. Sepsis Labs: No results for input(s): PROCALCITON, LATICACIDVEN in the last 168 hours.  Recent Results (from the past 240 hour(s))  SARS Coronavirus 2 (CEPHEID - Performed in [facility] hospital lab), Hosp Order     Status: None   Collection Time: 09/24/18  5:25 PM  Result Value Ref Range Status   SARS Coronavirus 2 NEGATIVE NEGATIVE Final     Comment: (NOTE) If result is NEGATIVE SARS-CoV-2 target nucleic acids are NOT DETECTED. The SARS-CoV-2 RNA is generally detectable in upper and lower  respiratory specimens during the acute phase of infection. The lowest  concentration of SARS-CoV-2 viral copies this assay can detect is 250  copies / mL. A negative result does not preclude SARS-CoV-2 infection  and should not be used as the sole basis for treatment or other  patient management decisions.  A negative result may occur with  improper specimen collection / handling, submission of specimen other  than nasopharyngeal swab, presence of viral mutation(s) within the  areas targeted by this assay, and inadequate number of viral copies  (<250 copies / mL). A negative result must be combined with clinical  observations, patient history, and epidemiological information. If result is POSITIVE SARS-CoV-2 target nucleic acids are DETECTED. The SARS-CoV-2 RNA is generally detectable in upper and lower  respiratory specimens dur ing  the acute phase of infection.  Positive  results are indicative of active infection with SARS-CoV-2.  Clinical  correlation with patient history and other diagnostic information is  necessary to determine patient infection status.  Positive results do  not rule out bacterial infection or co-infection with other viruses. If result is PRESUMPTIVE POSTIVE SARS-CoV-2 nucleic acids MAY BE PRESENT.   A presumptive positive result was obtained on the submitted specimen  and confirmed on repeat testing.  While 2019 novel coronavirus  (SARS-CoV-2) nucleic acids may be present in the submitted sample  additional confirmatory testing may be necessary for epidemiological  and / or clinical management purposes  to differentiate between  SARS-CoV-2 and other Sarbecovirus currently known to infect humans.  If clinically indicated additional testing with an alternate test  methodology [phone]) is advised. The SARS-CoV-2  RNA is generally  detectable in upper and lower respiratory sp ecimens during the acute  phase of infection. The expected result is Negative. Fact Sheet for Patients:  StrictlyIdeas.no Fact Sheet for Healthcare Providers: BankingDealers.co.za This test is not yet approved or cleared by the [place] FDA and has been authorized for detection and/or diagnosis of SARS-CoV-2 by FDA under an Emergency Use Authorization (EUA).  This EUA will remain in effect (meaning this test can be used) for the duration of the COVID-19 declaration under Section 564(b)(1) of the Act, 21 U.S.C. section 360bbb-3(b)(1), unless the authorization is terminated or revoked sooner. Performed at [facility], [place] [address], [place], [place] [address]          Radiology Studies: Ct Hip Right Wo Contrast  Result Date: 09/24/2018 CLINICAL DATA:  Felt a pop in the right hip while walking. Recent right hip replacement 5 days ago. EXAM: CT OF THE RIGHT HIP WITHOUT CONTRAST TECHNIQUE: Multidetector CT imaging of the right hip was performed according to the standard protocol. Multiplanar CT image reconstructions were also generated. COMPARISON:  Pelvic x-rays from same day and Sep 19, 2018. FINDINGS: Bones/Joint/Cartilage Prior right total hip arthroplasty. There is an acute nondisplaced periprosthetic fracture of the greater trochanter. No dislocation. No large joint effusion. Chronic subchondral cystic change in the acetabulum. Ligaments Suboptimally assessed by CT. Muscles and Tendons Grossly intact. Soft tissues Small amount of soft tissue edema with a few foci of subcutaneous emphysema in the anterior right hip, likely postsurgical. No fluid collection or soft tissue mass. IMPRESSION: 1. Prior right total hip arthroplasty with acute nondisplaced periprosthetic fracture of the greater trochanter. Electronically Signed   By: Titus Dubin M.D.   On:  09/24/2018 17:14   Dg Chest Port 1 View  Result Date: 09/24/2018 CLINICAL DATA:  Preoperative imaging. EXAM: PORTABLE CHEST 1 VIEW COMPARISON:  12/03/2011 FINDINGS: Lungs are clear. Heart and mediastinum are within normal limits. Trachea is midline. Negative for a pneumothorax. No acute bone abnormality. Degenerative changes at the right [facility] joint. IMPRESSION: No acute cardiopulmonary disease. Electronically Signed   By: Markus Daft M.D.   On: 09/24/2018 17:57   Dg Hip Unilat W Or Wo Pelvis 2-3 Views Right  Result Date: 09/24/2018 CLINICAL DATA:  Hip replacement Sep 19, 2018. Pop while walking. Unable to move right leg. EXAM: DG HIP (WITH OR WITHOUT PELVIS) 2-3V RIGHT COMPARISON:  None. FINDINGS: There is no evidence of hip fracture or dislocation. There is no evidence of arthropathy or other focal bone abnormality. IMPRESSION: Negative. Electronically Signed   By: Dorise Bullion III M.D   On: 09/24/2018 14:19  Scheduled Meds:  aspirin  325 mg Oral Once   [START ON 09/27/2018] aspirin  325 mg Oral BID   enoxaparin (LOVENOX) injection  40 mg Subcutaneous Q24H   hydrochlorothiazide  12.5 mg Oral q morning - 10a   sertraline  100 mg Oral Daily   sodium chloride flush  3 mL Intravenous Q12H   Continuous Infusions:   LOS: 1 day    Time spent: 35 minutes    Irine Seal, MD [facility]  If 7PM-7AM, please contact night-coverage [email or web address] 09/25/2018, 5:47 PM

## 2018-09-25 NOTE — Progress Notes (Signed)
Subjective:    Christina Zhang is a 73 y.o. female who complains of increased right hip pain, 6 days s/p right total hip replacement by Dr. Wynelle Link. She was discharged home on 09/20/18, and states she was doing well. She reports that she was doing her HEP without much difficulty. Yesterday, she was getting up from the bed, took a step forward, and subsequently felt significant pain in the right hip. She states she was unable to move, and her daughter called EMS. She denies injury or fall. Orthopedics was consulted. In the ED, plain radiographs showed no evidence of hip fracture or dislocation. CT was obtained which was read as prior right total hip arthroplasty with acute nondisplaced periprosthetic fracture of the greater trochanter.    Patient reports pain as mild.  Pain increases with movement of leg, but overall well controlled. +voiding, +flatus, tolerating PO without n/v. Denies CP, SOB, abdominal pain, calf pain.   Objective: Vital signs in last 24 hours: Temp:  [98 F (36.7 C)-98.9 F (37.2 C)] 98 F (36.7 C) (05/17 0528) Pulse Rate:  [66-94] 66 (05/17 0528) Resp:  [13-16] 16 (05/17 0528) BP: (116-176)/(62-96) 116/65 (05/17 0528) SpO2:  [93 %-99 %] 93 % (05/17 0528) Weight:  [98.5 kg] 98.5 kg (05/16 1947)  Intake/Output from previous day: 05/16 0701 - 05/17 0700 In: 150 [P.O.:150] Out: 200 [Urine:200] Intake/Output this shift: No intake/output data recorded.  Recent Labs    09/24/18 1726 09/25/18 0434  HGB 10.7* 9.9*   Recent Labs    09/24/18 1726 09/25/18 0434  WBC 9.7 8.6  RBC 3.45* 3.22*  HCT 32.7* 31.0*  PLT 458* 390   Recent Labs    09/24/18 1726 09/25/18 0434  NA 138 139  K 3.4* 3.3*  CL 103 106  CO2 25 24  BUN 11 14  CREATININE 0.59 0.55  GLUCOSE 98 92  CALCIUM 8.8* 8.6*   No results for input(s): LABPT, INR in the last 72 hours.  Neurologically intact ABD soft Neurovascular intact Sensation intact distally Intact pulses  distally Dorsiflexion/Plantar flexion intact Incision: dressing C/D/I No cellulitis present Compartment soft, no palpable cords, calfs equally tender to palpation bilaterally. Homan's negative bilaterally.    Assessment/Plan:  Assessment: Right hip acute nondisplaced periprosthetic fracture of the greater trochanter, s/p right total hip arthroplasty  Plan:  Dr. Maureen Ralphs to eval patient in am.    PT: TDWB to the RLE DVT ppx: Patient may continue on Aspirin 325 BID, but hold Monday morning dose.  Diet: NPO after midnight for possible surgery in am  Anticipated LOS equal to or greater than 2 midnights due to - Age 60 and older with one or more of the following:  - Obesity  - Expected need for hospital services (PT, OT, Nursing) required for safe  discharge  - Anticipated need for postoperative skilled nursing care or inpatient rehab  - Active co-morbidities: None OR   - Unanticipated findings during/Post Surgery: post-op fracture.   - Patient is a high risk of re-admission due to: None    [name] 09/25/2018, 8:46 AM

## 2018-09-25 NOTE — Progress Notes (Signed)
PT Cancellation Note  Patient Details Name: Christina Zhang MRN: [id number] DOB: [birth year]   Cancelled Treatment:    Reason Eval/Treat Not Completed: Patient not medically ready. Pt is on strict bedrest with non-displaced periprosthetic fx.  PA ordered PT, TDWB RLE--Dr. Wynelle Link has not yet seen this pt, pt for possible surgery tomorrow--will await MD recommendations and updated activity status prior to initiating PT Eval/mobility.   Kenyon Ana, PT  Pager: [phone] Acute Rehab Dept [facility]): [phone]   09/25/2018  [facility] 09/25/2018, 9:12 AM

## 2018-09-26 DIAGNOSIS — E538 Deficiency of other specified B group vitamins: Secondary | ICD-10-CM

## 2018-09-26 LAB — BASIC METABOLIC PANEL
Anion gap: 8 (ref 5–15)
BUN: 12 mg/dL (ref 8–23)
CO2: 24 mmol/L (ref 22–32)
Calcium: 8.5 mg/dL — ABNORMAL LOW (ref 8.9–10.3)
Chloride: 107 mmol/L (ref 98–111)
Creatinine, Ser: 0.58 mg/dL (ref 0.44–1.00)
GFR calc Af Amer: >60 mL/min (ref >60)
GFR calc non Af Amer: >60 mL/min (ref >60)
Glucose, Bld: 91 mg/dL (ref 70–99)
Potassium: 3.5 mmol/L (ref 3.5–5.1)
Sodium: 139 mmol/L (ref 135–145)

## 2018-09-26 LAB — IRON AND TIBC
Iron: 41 ug/dL (ref 28–170)
Saturation Ratios: 13 % (ref 10.4–31.8)
TIBC: 310 ug/dL (ref 250–450)
UIBC: 269 ug/dL

## 2018-09-26 LAB — CBC
HCT: 30.2 % — ABNORMAL LOW (ref 36.0–46.0)
Hemoglobin: 9.9 g/dL — ABNORMAL LOW (ref 12.0–15.0)
MCH: 31.2 pg (ref 26.0–34.0)
MCHC: 32.8 g/dL (ref 30.0–36.0)
MCV: 95.3 fL (ref 80.0–100.0)
Platelets: 435 10*3/uL — ABNORMAL HIGH (ref 150–400)
RBC: 3.17 MIL/uL — ABNORMAL LOW (ref 3.87–5.11)
RDW: 12.5 % (ref 11.5–15.5)
WBC: 10 10*3/uL (ref 4.0–10.5)
nRBC: 0 % (ref 0.0–0.2)

## 2018-09-26 LAB — FERRITIN: Ferritin: 70 ng/mL (ref 11–307)

## 2018-09-26 LAB — VITAMIN B12: Vitamin B-12: 174 pg/mL — ABNORMAL LOW (ref 180–914)

## 2018-09-26 LAB — FOLATE: Folate: 9.2 ng/mL (ref >5.9)

## 2018-09-26 MED ORDER — VITAMIN B-12 1000 MCG PO TABS: 1000.0000 ug | ORAL_TABLET | Freq: Every day | ORAL | Status: AC

## 2018-09-26 MED ORDER — POTASSIUM CHLORIDE CRYS ER 20 MEQ PO TBCR
40.0000 meq | EXTENDED_RELEASE_TABLET | Freq: Once | ORAL | Status: AC
  Administered 2018-09-26: 40 meq via ORAL
  Filled 2018-09-26: qty 2

## 2018-09-26 MED ORDER — CYANOCOBALAMIN 1000 MCG/ML IJ SOLN
1000.0000 ug | Freq: Every day | INTRAMUSCULAR | Status: DC
  Administered 2018-09-26 – 2018-09-28 (×3): 1000 ug via SUBCUTANEOUS
  Filled 2018-09-26 (×4): qty 1

## 2018-09-26 NOTE — Progress Notes (Signed)
Subjective: Christina Zhang had sudden onset of right hip pain 2 days ago necessitating evaluation in the ED and subsequent admission for a periprosthetic femur fracture. Her CT showed a non-displaced greater trochanter fracture. She complains of pain with hip movement but less pain at rest now.  Objective: Vital signs in last 24 hours: Temp:  [97.6 F (36.4 C)-98.3 F (36.8 C)] 97.6 F (36.4 C) (05/18 0524) Pulse Rate:  [69-94] 69 (05/18 0524) Resp:  [16-17] 16 (05/18 0524) BP: (125-151)/(65-79) 127/65 (05/18 0524) SpO2:  [93 %-96 %] 93 % (05/18 0524)  Intake/Output from previous day: 05/17 0701 - 05/18 0700 In: 995.1 [P.O.:360; I.V.:635.1] Out: 400 [Urine:400] Intake/Output this shift: Total I/O In: 635.1 [I.V.:635.1] Out: 400 [Urine:400]  Recent Labs    09/24/18 1726 09/25/18 0434 09/26/18 0315  HGB 10.7* 9.9* 9.9*   Recent Labs    09/25/18 0434 09/26/18 0315  WBC 8.6 10.0  RBC 3.22* 3.17*  HCT 31.0* 30.2*  PLT 390 435*   Recent Labs    09/25/18 0434 09/26/18 0315  NA 139 139  K 3.3* 3.5  CL 106 107  CO2 24 24  BUN 14 12  CREATININE 0.55 0.58  GLUCOSE 92 91  CALCIUM 8.6* 8.5*   No results for input(s): LABPT, INR in the last 72 hours.  Neurovascular intact Dorsiflexion/Plantar flexion intact Incision: no drainage No cellulitis present Compartment soft Tender lateral hip. Discomfort on attempted range of motion. No deformity    Assessment/Plan: She has a non-displaced right greater trochanter periprosthetic fracture. Fortunately this will not need surgical treatment. SHe may begin physical therapy for partial weight bearing right lower extremity. I told her that there will be moderate pain for several days then this will slowly improve. As long as her comfort level improves and she does well with PT anticipate she will be ready for discharge tomorrow or Wednesday. Appreciate admission by Dr. Grandville Silos. OK to transfer to my service as she does not have any  pressing medical issues except for the hip.    Christina Zhang 09/26/2018, 6:37 AM

## 2018-09-26 NOTE — Evaluation (Signed)
Physical Therapy Evaluation Patient Details Name: Christina Zhang MRN: [id number] DOB: June 24, 1945 Today's Date: 09/26/2018   History of Present Illness  Christina Zhang is a 73 y.o. female with medical history significant of hypertension and recent total right hip arthroplasty on 09/19/2018 presented to the emergency department with a complaint of hip pain.  According to patient, she tried to stand up from her bed today and she was fine up until then and then she started hurting her right hip when she started take her foot forward and felt a pull.  She was unable to move her right leg so she was helped out by her daughter nearby.  EMS was called and patient was brought into the emergency department.  Initial x-ray of hip was negative.  Orthopedics was consulted and recommended CT of the hip which revealed acute nondisplaced periprosthetic fracture of the greater trochanter.  Orthopedics recommended admission to hospital. No surgery recommended per chart review.     Clinical Impression  Patient was found awake and alert in bed. Patient requested use of BSC. Therapist instructed patient on weight bearing restrictions prior to mobilization. Patient required min assist with bedrail and HOB elevated. Patient required mod A with use of RW for transfers from sit to stand and for stand pivot transfers to and from [facility]. Patient fatigued following this and patient requested bath, so once sitting EOB therapist notified nursing staff and left patient with nursing staff for bath with all other needs met. Patient would benefit from continued skilled physical therapy to continue progressing patient's independence with transfers, ambulation, and overall functional mobility while in the hospital and at recommended venue below.     Follow Up Recommendations SNF    Equipment Recommendations  None recommended by PT    Recommendations for Other Services       Precautions / Restrictions Precautions Precautions:  Fall Precaution Comments: Patient was educated before and during transfer on following precautions Restrictions Weight Bearing Restrictions: Yes RLE Weight Bearing: Touchdown weight bearing      Mobility  Bed Mobility Overal bed mobility: Needs Assistance Bed Mobility: Supine to Sit     Supine to sit: Min assist;HOB elevated     General bed mobility comments: Min assist for supine to sit for RLE management, increased time to scoot to EOB with use of bed rail.   Transfers Overall transfer level: Needs assistance Equipment used: Rolling walker (2 wheeled) Transfers: Sit to/from [facility] Sit to Stand: Mod assist Stand pivot transfers: Mod assist       General transfer comment: Moderate assistance with education on weight-bearing precautions prior to transfer and throughout. Moderate assistance and forward flexed trunk with stand pivot transfer to and from the [facility] with RW.   Ambulation/Gait                Stairs            Wheelchair Mobility    Modified Rankin (Stroke Patients Only)       Balance Overall balance assessment: Needs assistance Sitting-balance support: No upper extremity supported;Feet supported Sitting balance-Leahy Scale: Good Sitting balance - Comments: Good sitting balance   Standing balance support: Bilateral upper extremity supported Standing balance-Leahy Scale: Fair Standing balance comment: Required RW to follow weight bearing restrictions and for balance.                              Pertinent Vitals/Pain Pain Assessment: 0-10  Pain Score: 3  Pain Location: Right hip Pain Descriptors / Indicators: Aching Pain Intervention(s): Limited activity within patient's tolerance;Monitored during session    [place] expects to be discharged to:: Private residence Living Arrangements: Alone Available Help at Discharge: Family;Available 24 hours/day(daughter will be staying with pt for a  short time following discharge, pt's daughter is a physical therapist) Type of Home: House Home Access: Stairs to enter Entrance Stairs-Rails: Right Entrance Stairs-Number of Steps: 3 Home Layout: One level Home Equipment: [place] - 2 wheels;Cane - single point;Shower seat - built in;Bedside commode;Shower seat      Prior Function Level of Independence: Independent with assistive device(s)         Comments: Before surgery pt reports using cane as needed PTA, but typically did not use AD. Pt required no assist to perform ADLs PTA. Since returning home following surgery patient has required assistance for mobility and ADLs.      Hand Dominance   Dominant Hand: Right    Extremity/Trunk Assessment   Upper Extremity Assessment Upper Extremity Assessment: Overall WFL for tasks assessed    Lower Extremity Assessment Lower Extremity Assessment: Generalized weakness;RLE deficits/detail RLE Deficits / Details: Patient very painful in right hip, unable to fully assess  RLE: Unable to fully assess due to pain RLE Sensation: WNL    Cervical / Trunk Assessment Cervical / Trunk Assessment: Normal  Communication   Communication: No difficulties  Cognition Arousal/Alertness: Awake/alert Behavior During Therapy: WFL for tasks assessed/performed Overall Cognitive Status: Within Functional Limits for tasks assessed                                        General Comments      Exercises     Assessment/Plan    PT Assessment Patient needs continued PT services  PT Problem List Decreased strength;Decreased mobility;Decreased activity tolerance;Decreased balance;Decreased knowledge of use of DME;Pain       PT Treatment Interventions DME instruction;Functional mobility training;Balance training;Patient/family education;Gait training;Therapeutic activities;Neuromuscular re-education;Therapeutic exercise;Stair training    PT Goals (Current goals can be found in the Care  Plan section)  Acute Rehab PT Goals Patient Stated Goal: Improved independence, patient stated she may need further rehab following discharge PT Goal Formulation: With patient Time For Goal Achievement: 10/03/18 Potential to Achieve Goals: Good    Frequency 7X/week   Barriers to discharge        Co-evaluation               AM-PAC PT "6 Clicks" Mobility  Outcome Measure Help needed turning from your back to your side while in a flat bed without using bedrails?: A Lot Help needed moving from lying on your back to sitting on the side of a flat bed without using bedrails?: A Lot Help needed moving to and from a bed to a chair (including a wheelchair)?: A Lot Help needed standing up from a chair using your arms (e.g., wheelchair or bedside chair)?: A Lot Help needed to walk in hospital room?: A Lot Help needed climbing 3-5 steps with a railing? : Total 6 Click Score: 11    End of Session Equipment Utilized During Treatment: Gait belt Activity Tolerance: Patient limited by fatigue Patient left: with nursing/sitter in room;Other (comment)(Sitting EOB for bedside bath with Nursing Staff) Nurse Communication: Mobility status;Other (comment)(Patient request for bath) PT Visit Diagnosis: Other abnormalities of gait and mobility (R26.89);Difficulty in  walking, not elsewhere classified (R26.2);Muscle weakness (generalized) (M62.81)    Time: 1220-1300 PT Time Calculation (min) (ACUTE ONLY): 40 min   Charges:   PT Evaluation $PT Eval Moderate Complexity: 1 Mod        Clarene Critchley PT, DPT 1:29 PM, 09/26/18 [phone]

## 2018-09-26 NOTE — Plan of Care (Signed)
  Problem: Acute Rehab PT Goals(only PT should resolve) Goal: Pt Will Go Supine/Side To Sit Outcome: Progressing Flowsheets (Taken 09/26/2018 1331) Pt will go Supine/Side to Sit: with modified independence Goal: Patient Will Transfer Sit To/From Stand Outcome: Progressing Flowsheets (Taken 09/26/2018 1331) Patient will transfer sit to/from stand: with supervision Goal: Pt Will Transfer Bed To Chair/Chair To Bed Outcome: Progressing Flowsheets (Taken 09/26/2018 1331) Pt will Transfer Bed to Chair/Chair to Bed: with supervision Goal: Pt Will Ambulate Outcome: Progressing Flowsheets (Taken 09/26/2018 1331) Pt will Ambulate: 10 feet; with rolling walker Goal: Pt Will Go Up/Down Stairs Outcome: Progressing Flowsheets (Taken 09/26/2018 1331) Pt will Go Up / Down Stairs: 3-5 stairs   Clarene Critchley PT, DPT 1:32 PM, 09/26/18 [phone]

## 2018-09-26 NOTE — Progress Notes (Signed)
PROGRESS NOTE    Christina Zhang  ZOX:[id number] DOB: July 10, 1945 DOA: 09/24/2018 PCP: Caryl Bis, MD    Brief Narrative:  HPI per Dr. Ardelle Zhang is a 73 y.o. female with medical history significant of hypertension and recent total right hip arthroplasty on 09/19/2018 presented to the emergency department with a complaint of hip pain.  According to patient, she tried to stand up from her bed today and she was fine up until then and then she started hurting her right hip when she started take her foot forward and felt a pull.  She was unable to move her right leg so she was helped out by her daughter nearby.  EMS was called and patient was brought into the emergency department.  Her pain is currently 4 out of 10.  She is comfortable.  Initial x-ray of hip was negative.  Orthopedics was consulted and recommended CT of the hip which revealed acute nondisplaced periprosthetic fracture of the greater trochanter.  Orthopedics recommended admission to hospital service and they will consult.  ED Course: Upon initial presentation, patient was hemodynamically stable.  Labs are still pending.  CT hip shows acute nondisplaced periprosthetic fracture of the right greater trochanter.  Assessment & Plan:   Principal Problem:   Periprosthetic fracture around internal prosthetic right hip joint [facility]) Active Problems:   Hip fracture requiring operative repair, right, closed, with delayed healing, subsequent encounter   Essential hypertension   Depression   Anemia   Vitamin B12 deficiency  1 acute nondisplaced periprosthetic fracture of the great trochanter on the right Patient status post recent right hip arthroplasty 09/19/2018.  Patient was assessed PA from orthopedics.  Patient seen by Dr. Wynelle Link this morning who is recommending conservative treatment at this time and feels no surgical treatment needed.  PT to evaluate.  Pain management.  Place patient back on a regular diet and discontinue  IV fluids.  We will transfer service to orthopedics service.  Per orthopedics.    2.  Hypertension Blood pressure stable.  Continue home regimen HCTZ.  Follow.  3.  Depression Stable.  Continue home regimen Zoloft.  4.  Hypokalemia Repleted.  K. Dur 40 mEq p.o. x1.  5.  Anemia of chronic disease/vitamin B12 deficiency Patient with no overt bleeding.  Hemoglobin stable at 9.9.  Vitamin B12 levels at 174.  Placed on vitamin B12 1000 MCG's subcutaneously daily while hospitalized and on discharge will recommend vitamin B12 1000 MCG's orally daily with outpatient follow-up with PCP.    DVT prophylaxis: Lovenox Code Status: Full Family Communication: Updated patient.  No family at bedside. Disposition Plan: Transfer to Dr. Wynelle Link service with orthopedics.    Consultants:   Orthopedics: Griffith Citron, [place] 09/24/2018  Procedures:   CT right hip 09/24/2018  Chest x-ray 09/24/2018  Plain hips of the right hip and pelvis 09/24/2018  Antimicrobials:   None   Subjective: Patient laying in bed.  Patient denies any chest pain.  No shortness of breath.  No abdominal pain.    Objective: Vitals:   09/25/18 0528 09/25/18 1310 09/25/18 2105 09/26/18 0524  BP: 116/65 125/71 (!) 151/79 127/65  Pulse: 66 79 94 69  Resp: 16 17 16 16   Temp: 98 F (36.7 C) 98.3 F (36.8 C) 98.2 F (36.8 C) 97.6 F (36.4 C)  TempSrc: Oral Oral Oral Oral  SpO2: 93% 96% 96% 93%  Weight:      Height:        Intake/Output Summary (Last  24 hours) at 09/26/2018 1037 Last data filed at 09/26/2018 0921 Gross per 24 hour  Intake 1115.13 ml  Output 700 ml  Net 415.13 ml   Filed Weights   09/24/18 1947  Weight: 98.5 kg    Examination:  General exam: NAD Respiratory system: CTA B.  No wheezes, no crackles, no rhonchi.  Normal respiratory effort.   Cardiovascular system: Regular rate rhythm no murmurs rubs or gallops.  No JVD.  No lower extremity edema.  Gastrointestinal system: Abdomen is soft,  nontender, nondistended, positive bowel sounds.  No rebound.  No guarding.  Central nervous system: Alert and oriented. No focal neurological deficits. Extremities: Symmetric 5 x 5 power. Skin: No rashes, lesions or ulcers Psychiatry: Judgement and insight appear normal. Mood & affect appropriate.     Data Reviewed: I have personally reviewed following labs and imaging studies  CBC: Recent Labs  Lab 09/20/18 0335 09/24/18 1726 09/25/18 0434 09/26/18 0315  WBC 11.9* 9.7 8.6 10.0  NEUTROABS  --  6.2  --   --   HGB 10.0* 10.7* 9.9* 9.9*  HCT 31.4* 32.7* 31.0* 30.2*  MCV 95.7 94.8 96.3 95.3  PLT 279 458* 390 277*   Basic Metabolic Panel: Recent Labs  Lab 09/20/18 0335 09/24/18 1726 09/25/18 0434 09/26/18 0315  NA 140 138 139 139  K 3.4* 3.4* 3.3* 3.5  CL 107 103 106 107  CO2 26 25 24 24   GLUCOSE 139* 98 92 91  BUN 13 11 14 12   CREATININE 0.70 0.59 0.55 0.58  CALCIUM 8.3* 8.8* 8.6* 8.5*   GFR: Estimated Creatinine Clearance: 77.5 mL/min (by C-G formula based on SCr of 0.58 mg/dL). Liver Function Tests: Recent Labs  Lab 09/25/18 0434  AST 23  ALT 21  ALKPHOS 85  BILITOT 0.7  PROT 6.2*  ALBUMIN 2.9*   No results for input(s): LIPASE, AMYLASE in the last 168 hours. No results for input(s): AMMONIA in the last 168 hours. Coagulation Profile: No results for input(s): INR, PROTIME in the last 168 hours. Cardiac Enzymes: No results for input(s): CKTOTAL, CKMB, CKMBINDEX, TROPONINI in the last 168 hours. BNP (last 3 results) No results for input(s): PROBNP in the last 8760 hours. HbA1C: No results for input(s): HGBA1C in the last 72 hours. CBG: No results for input(s): GLUCAP in the last 168 hours. Lipid Profile: No results for input(s): CHOL, HDL, LDLCALC, TRIG, CHOLHDL, LDLDIRECT in the last 72 hours. Thyroid Function Tests: No results for input(s): TSH, T4TOTAL, FREET4, T3FREE, THYROIDAB in the last 72 hours. Anemia Panel: Recent Labs    09/26/18 0315    VITAMINB12 174*  FOLATE 9.2  FERRITIN 70  TIBC 310  IRON 41   Sepsis Labs: No results for input(s): PROCALCITON, LATICACIDVEN in the last 168 hours.  Recent Results (from the past 240 hour(s))  SARS Coronavirus 2 (CEPHEID - Performed in [place] hospital lab), Hosp Order     Status: None   Collection Time: 09/24/18  5:25 PM  Result Value Ref Range Status   SARS Coronavirus 2 NEGATIVE NEGATIVE Final    Comment: (NOTE) If result is NEGATIVE SARS-CoV-2 target nucleic acids are NOT DETECTED. The SARS-CoV-2 RNA is generally detectable in upper and lower  respiratory specimens during the acute phase of infection. The lowest  concentration of SARS-CoV-2 viral copies this assay can detect is 250  copies / mL. A negative result does not preclude SARS-CoV-2 infection  and should not be used as the sole basis for treatment or other  patient management decisions.  A negative result may occur with  improper specimen collection / handling, submission of specimen other  than nasopharyngeal swab, presence of viral mutation(s) within the  areas targeted by this assay, and inadequate number of viral copies  (<250 copies / mL). A negative result must be combined with clinical  observations, patient history, and epidemiological information. If result is POSITIVE SARS-CoV-2 target nucleic acids are DETECTED. The SARS-CoV-2 RNA is generally detectable in upper and lower  respiratory specimens dur ing the acute phase of infection.  Positive  results are indicative of active infection with SARS-CoV-2.  Clinical  correlation with patient history and other diagnostic information is  necessary to determine patient infection status.  Positive results do  not rule out bacterial infection or co-infection with other viruses. If result is PRESUMPTIVE POSTIVE SARS-CoV-2 nucleic acids MAY BE PRESENT.   A presumptive positive result was obtained on the submitted specimen  and confirmed on repeat testing.   While 2019 novel coronavirus  (SARS-CoV-2) nucleic acids may be present in the submitted sample  additional confirmatory testing may be necessary for epidemiological  and / or clinical management purposes  to differentiate between  SARS-CoV-2 and other Sarbecovirus currently known to infect humans.  If clinically indicated additional testing with an alternate test  methodology [phone]) is advised. The SARS-CoV-2 RNA is generally  detectable in upper and lower respiratory sp ecimens during the acute  phase of infection. The expected result is Negative. Fact Sheet for Patients:  StrictlyIdeas.no Fact Sheet for Healthcare Providers: BankingDealers.co.za This test is not yet approved or cleared by the [place] FDA and has been authorized for detection and/or diagnosis of SARS-CoV-2 by FDA under an Emergency Use Authorization (EUA).  This EUA will remain in effect (meaning this test can be used) for the duration of the COVID-19 declaration under Section 564(b)(1) of the Act, 21 U.S.C. section 360bbb-3(b)(1), unless the authorization is terminated or revoked sooner. Performed at [facility], [place] [address], [place], [place] [address]          Radiology Studies: Ct Hip Right Wo Contrast  Result Date: 09/24/2018 CLINICAL DATA:  Felt a pop in the right hip while walking. Recent right hip replacement 5 days ago. EXAM: CT OF THE RIGHT HIP WITHOUT CONTRAST TECHNIQUE: Multidetector CT imaging of the right hip was performed according to the standard protocol. Multiplanar CT image reconstructions were also generated. COMPARISON:  Pelvic x-rays from same day and Sep 19, 2018. FINDINGS: Bones/Joint/Cartilage Prior right total hip arthroplasty. There is an acute nondisplaced periprosthetic fracture of the greater trochanter. No dislocation. No large joint effusion. Chronic subchondral cystic change in the acetabulum. Ligaments  Suboptimally assessed by CT. Muscles and Tendons Grossly intact. Soft tissues Small amount of soft tissue edema with a few foci of subcutaneous emphysema in the anterior right hip, likely postsurgical. No fluid collection or soft tissue mass. IMPRESSION: 1. Prior right total hip arthroplasty with acute nondisplaced periprosthetic fracture of the greater trochanter. Electronically Signed   By: Titus Dubin M.D.   On: 09/24/2018 17:14   Dg Chest Port 1 View  Result Date: 09/24/2018 CLINICAL DATA:  Preoperative imaging. EXAM: PORTABLE CHEST 1 VIEW COMPARISON:  12/03/2011 FINDINGS: Lungs are clear. Heart and mediastinum are within normal limits. Trachea is midline. Negative for a pneumothorax. No acute bone abnormality. Degenerative changes at the right [facility] joint. IMPRESSION: No acute cardiopulmonary disease. Electronically Signed   By: Markus Daft M.D.   On: 09/24/2018  17:57   Dg Hip Unilat W Or Wo Pelvis 2-3 Views Right  Result Date: 09/24/2018 CLINICAL DATA:  Hip replacement Sep 19, 2018. Pop while walking. Unable to move right leg. EXAM: DG HIP (WITH OR WITHOUT PELVIS) 2-3V RIGHT COMPARISON:  None. FINDINGS: There is no evidence of hip fracture or dislocation. There is no evidence of arthropathy or other focal bone abnormality. IMPRESSION: Negative. Electronically Signed   By: Dorise Bullion III M.D   On: 09/24/2018 14:19        Scheduled Meds:  Derrill Memo ON 09/27/2018] aspirin  325 mg Oral BID   cyanocobalamin  1,000 mcg Subcutaneous Daily   enoxaparin (LOVENOX) injection  40 mg Subcutaneous Q24H   hydrochlorothiazide  12.5 mg Oral q morning - 10a   sertraline  100 mg Oral Daily   sodium chloride flush  3 mL Intravenous Q12H   Continuous Infusions:   LOS: 2 days    Time spent: 35 minutes    Irine Seal, MD [facility]  If 7PM-7AM, please contact night-coverage [email or web address] 09/26/2018, 10:37 AM

## 2018-09-27 LAB — BASIC METABOLIC PANEL
Anion gap: 10 (ref 5–15)
BUN: 11 mg/dL (ref 8–23)
CO2: 22 mmol/L (ref 22–32)
Calcium: 8.7 mg/dL — ABNORMAL LOW (ref 8.9–10.3)
Chloride: 107 mmol/L (ref 98–111)
Creatinine, Ser: 0.59 mg/dL (ref 0.44–1.00)
GFR calc Af Amer: >60 mL/min (ref >60)
GFR calc non Af Amer: >60 mL/min (ref >60)
Glucose, Bld: 96 mg/dL (ref 70–99)
Potassium: 3.5 mmol/L (ref 3.5–5.1)
Sodium: 139 mmol/L (ref 135–145)

## 2018-09-27 NOTE — Progress Notes (Signed)
   Subjective:    Patient reports pain as moderate.   Patient seen in rounds by Dr. Wynelle Link. Patient is well, and has had no acute complaints or problems other than pain in the right hip. No issues overnight. Denies chest pain or Sob. Voiding without difficulty and positive flatus.  Plan is to go Home after hospital stay.  Objective: Vital signs in last 24 hours: Temp:  [98 F (36.7 C)-98.6 F (37 C)] 98.5 F (36.9 C) (05/19 0650) Pulse Rate:  [67-90] 87 (05/19 0650) Resp:  [16] 16 (05/18 2045) BP: (122-140)/(68-70) 140/69 (05/19 0650) SpO2:  [95 %-98 %] 95 % (05/19 0650)  Intake/Output from previous day:  Intake/Output Summary (Last 24 hours) at 09/27/2018 0741 Last data filed at 09/27/2018 0140 Gross per 24 hour  Intake 1243 ml  Output 1850 ml  Net -607 ml    Intake/Output this shift: No intake/output data recorded.  Labs: Recent Labs    09/24/18 1726 09/25/18 0434 09/26/18 0315  HGB 10.7* 9.9* 9.9*   Recent Labs    09/25/18 0434 09/26/18 0315  WBC 8.6 10.0  RBC 3.22* 3.17*  HCT 31.0* 30.2*  PLT 390 435*   Recent Labs    09/26/18 0315 09/27/18 0427  NA 139 139  K 3.5 3.5  CL 107 107  CO2 24 22  BUN 12 11  CREATININE 0.58 0.59  GLUCOSE 91 96  CALCIUM 8.5* 8.7*   No results for input(s): LABPT, INR in the last 72 hours.  Exam: General - Patient is Alert and Oriented Extremity - Neurologically intact Neurovascular intact Sensation intact distally Dorsiflexion/Plantar flexion intact Motor Function - intact, moving foot and toes well on exam. Right hip incision is healing well, no drainage, erythema or signs of infection.  Past Medical History:  Diagnosis Date  . Depression   . Diverticulosis   . Hypertension   . OA (osteoarthritis)    left hip  . PONV (postoperative nausea and vomiting)    after ortoscopic knee surgery yrs ago  . Wears glasses     Assessment/Plan:    Principal Problem:   Periprosthetic fracture around internal  prosthetic right hip joint (HCC) Active Problems:   Hip fracture requiring operative repair, right, closed, with delayed healing, subsequent encounter   Essential hypertension   Depression   Anemia   Vitamin B12 deficiency  Estimated body mass index is 35.05 kg/m as calculated from the following:   Height as of this encounter: 5\' 6"  (1.676 m).   Weight as of this encounter: 98.5 kg. Up with therapy  DVT Prophylaxis - Aspirin (still on regimen from [facility] last week) Partial weight bearing  Home health physical therapy ordered. Plan for possible discharge this afternoon if she is doing well and pain is manageable. Follow-up in the office in 1 week.   Theresa Duty, PA-C Orthopedic Surgery 09/27/2018, 7:41 AM

## 2018-09-27 NOTE — Care Management Important Message (Signed)
Important Message  Patient Details IM Letter given to [name] SW to present to the Patient Name: Christina Zhang MRN: [id number] Date of Birth: 27-May-1945   Medicare Important Message Given:  Yes    Kerin Salen 09/27/2018, 10:07 AMImportant Message  Patient Details  Name: Christina Zhang MRN: [id number] Date of Birth: 22-Jan-1946   Medicare Important Message Given:  Yes    Kerin Salen 09/27/2018, 10:07 AM

## 2018-09-27 NOTE — Progress Notes (Signed)
Patient took Vicodin for pain and became nauseous she later informed me that she was allergic, patient did vomit. I will not administer hydrocodone any longer.

## 2018-09-27 NOTE — Progress Notes (Signed)
Physical Therapy Treatment Patient Details Name: Christina Zhang MRN: [id number] DOB: [birth year] Today's Date: 09/27/2018    History of Present Illness Christina Zhang is a 73 y.o. female with medical history significant of hypertension and recent total right hip arthroplasty on 09/19/2018 presented to the emergency department with a complaint of hip pain.  According to patient, she tried to stand up from her bed today and she was fine up until then and then she started hurting her right hip when she started take her foot forward and felt a pull.  She was unable to move her right leg so she was helped out by her daughter nearby.  EMS was called and patient was brought into the emergency department.  Initial x-ray of hip was negative.  Orthopedics was consulted and recommended CT of the hip which revealed acute nondisplaced periprosthetic fracture of the greater trochanter.  Orthopedics recommended admission to hospital. No surgery recommended per chart review.     PT Comments    Pt reports the pain medicine she received earlier makes her nauseated however pt has been able to sleep.  Pt agreeable to mobilize again this afternoon.  Pt able to ambulate 28 feet however remains limited by pain and nausea.  Pt anticipates d/c home tomorrow.    Follow Up Recommendations  SNF(plan is for home with daughter however)     Equipment Recommendations  None recommended by PT    Recommendations for Other Services       Precautions / Restrictions Precautions Precautions: Fall Restrictions Weight Bearing Restrictions: Yes RLE Weight Bearing: Touchdown weight bearing    Mobility  Bed Mobility Overal bed mobility: Needs Assistance Bed Mobility: Supine to Sit;Sit to Supine     Supine to sit: Min assist;HOB elevated Sit to supine: Min assist;HOB elevated   General bed mobility comments: assist for R LE, increased time and effort  Transfers Overall transfer level: Needs assistance Equipment used:  Rolling walker (2 wheeled) Transfers: Sit to/from Stand Sit to Stand: Min guard         General transfer comment: verbal cues for TDWB, UE and LE positioning  Ambulation/Gait Ambulation/Gait assistance: Min assist;Min guard Gait Distance (Feet): 28 Feet Assistive device: Rolling walker (2 wheeled) Gait Pattern/deviations: Step-to pattern;Antalgic Gait velocity: decr    General Gait Details: cues for TDWB, UEs through RW to assist with maintaining WB restrictions, able to improve distance a little however pt continues with pain and nausea so distance limited   Stairs             Wheelchair Mobility    Modified Rankin (Stroke Patients Only)       Balance                                            Cognition Arousal/Alertness: Awake/alert Behavior During Therapy: WFL for tasks assessed/performed Overall Cognitive Status: Within Functional Limits for tasks assessed                                        Exercises Total Joint Exercises Ankle Circles/Pumps: AROM;10 reps;Both Quad Sets: AROM;10 reps;Both Gluteal Sets: AROM;10 reps;Both Heel Slides: AAROM;10 reps;Right Hip ABduction/ADduction: AAROM;10 reps;Right Long Arc Quad: AROM;10 reps;Seated;Right Marching in Standing: AROM;5 reps;Right;Seated    General Comments  Pertinent Vitals/Pain Pain Assessment: 0-10 Pain Score: 5  Pain Location: Right hip Pain Descriptors / Indicators: Aching;Sore Pain Intervention(s): Monitored during session;Repositioned;Premedicated before session    Home Living                      Prior Function            PT Goals (current goals can now be found in the care plan section) Progress towards PT goals: Progressing toward goals    Frequency    7X/week      PT Plan Current plan remains appropriate    Co-evaluation              AM-PAC PT "6 Clicks" Mobility   Outcome Measure  Help needed turning from  your back to your side while in a flat bed without using bedrails?: A Little Help needed moving from lying on your back to sitting on the side of a flat bed without using bedrails?: A Little Help needed moving to and from a bed to a chair (including a wheelchair)?: A Little Help needed standing up from a chair using your arms (e.g., wheelchair or bedside chair)?: A Little Help needed to walk in hospital room?: A Lot Help needed climbing 3-5 steps with a railing? : Total 6 Click Score: 15    End of Session Equipment Utilized During Treatment: Gait belt Activity Tolerance: Patient limited by pain Patient left: in bed;with call bell/phone within reach;with bed alarm set Nurse Communication: Mobility status;Patient requests pain meds PT Visit Diagnosis: Other abnormalities of gait and mobility (R26.89);Difficulty in walking, not elsewhere classified (R26.2);Muscle weakness (generalized) (M62.81)     Time: 9480-1655 PT Time Calculation (min) (ACUTE ONLY): 17 min  Charges:  $Gait Training: 8-22 mins                    Carmelia Bake, PT, DPT Acute Rehabilitation Services Office: [phone] Pager: [phone]  Trena Platt 09/27/2018, 4:10 PM

## 2018-09-27 NOTE — Progress Notes (Signed)
Physical Therapy Treatment Patient Details Name: Christina Zhang MRN: [id number] DOB: [birth year] Today's Date: 09/27/2018    History of Present Illness Christina Zhang is a 73 y.o. female with medical history significant of hypertension and recent total right hip arthroplasty on 09/19/2018 presented to the emergency department with a complaint of hip pain.  According to patient, she tried to stand up from her bed today and she was fine up until then and then she started hurting her right hip when she started take her foot forward and felt a pull.  She was unable to move her right leg so she was helped out by her daughter nearby.  EMS was called and patient was brought into the emergency department.  Initial x-ray of hip was negative.  Orthopedics was consulted and recommended CT of the hip which revealed acute nondisplaced periprosthetic fracture of the greater trochanter.  Orthopedics recommended admission to hospital. No surgery recommended per chart review.     PT Comments    Pt assisted with ambulating however only able to tolerate 4 feet and required recliner.  Pt encouraged to continue with exercises however no resistance due to weight bearing restrictions (also discouraged standing exercises at this time).  Pt performed exercises in recliner to tolerance.  Pt does not appear ready for d/c home today.   Follow Up Recommendations  SNF(however plan if for home with daughter)     Equipment Recommendations  None recommended by PT    Recommendations for Other Services       Precautions / Restrictions Precautions Precautions: Fall Restrictions Weight Bearing Restrictions: Yes RLE Weight Bearing: Touchdown weight bearing    Mobility  Bed Mobility Overal bed mobility: Needs Assistance Bed Mobility: Supine to Sit     Supine to sit: Min assist;HOB elevated     General bed mobility comments: assist for R LE, increased time and effort  Transfers Overall transfer level: Needs  assistance Equipment used: Rolling walker (2 wheeled) Transfers: Sit to/from Stand Sit to Stand: Min assist         General transfer comment: assist to rise and steady, cues for TDWB R LE (pt reports Dr. Wynelle Link told her she could do PWB if needed however encouraged TDWB if possible), cues for LE positioning  Ambulation/Gait Ambulation/Gait assistance: Min assist Gait Distance (Feet): 4 Feet Assistive device: Rolling walker (2 wheeled) Gait Pattern/deviations: Step-to pattern;Antalgic     General Gait Details: pt reports increased pain and limited distance, encouraged TDWB however pt more performing PWB, only able to tolerate 4 feet at this time and required recliner   Stairs             Wheelchair Mobility    Modified Rankin (Stroke Patients Only)       Balance                                            Cognition Arousal/Alertness: Awake/alert Behavior During Therapy: WFL for tasks assessed/performed Overall Cognitive Status: Within Functional Limits for tasks assessed                                        Exercises Total Joint Exercises Ankle Circles/Pumps: AROM;10 reps;Both Quad Sets: AROM;10 reps;Both Gluteal Sets: AROM;10 reps;Both Heel Slides: AAROM;10 reps;Right Hip ABduction/ADduction: AAROM;10 reps;Right  Long Arc Quad: AROM;10 reps;Seated;Right Marching in Standing: AROM;5 reps;Right;Seated    General Comments        Pertinent Vitals/Pain Pain Assessment: 0-10 Pain Score: 6  Pain Location: Right hip Pain Descriptors / Indicators: Aching;Sore Pain Intervention(s): Monitored during session;Limited activity within patient's tolerance;Repositioned;Patient requesting pain meds-RN notified    Home Living                      Prior Function            PT Goals (current goals can now be found in the care plan section) Progress towards PT goals: Progressing toward goals    Frequency     7X/week      PT Plan Current plan remains appropriate    Co-evaluation              AM-PAC PT "6 Clicks" Mobility   Outcome Measure  Help needed turning from your back to your side while in a flat bed without using bedrails?: A Lot Help needed moving from lying on your back to sitting on the side of a flat bed without using bedrails?: A Little Help needed moving to and from a bed to a chair (including a wheelchair)?: A Little Help needed standing up from a chair using your arms (e.g., wheelchair or bedside chair)?: A Little Help needed to walk in hospital room?: A Lot Help needed climbing 3-5 steps with a railing? : Total 6 Click Score: 14    End of Session Equipment Utilized During Treatment: Gait belt Activity Tolerance: Patient limited by pain Patient left: with nursing/sitter in room;in chair;with call bell/phone within reach;with chair alarm set Nurse Communication: Mobility status;Patient requests pain meds PT Visit Diagnosis: Other abnormalities of gait and mobility (R26.89);Difficulty in walking, not elsewhere classified (R26.2);Muscle weakness (generalized) (M62.81)     Time: 0277-4128 PT Time Calculation (min) (ACUTE ONLY): 26 min  Charges:  $Gait Training: 8-22 mins $Therapeutic Exercise: 8-22 mins                     Carmelia Bake, PT, DPT Acute Rehabilitation Services Office: [phone] Pager: [phone]  Trena Platt 09/27/2018, 1:41 PM

## 2018-09-27 NOTE — Progress Notes (Signed)
After Care Plan per Orthopedics:  Home health physical therapy ordered. Plan for possible discharge this afternoon if she is doing well and pain is manageable. Follow-up in the office in 1 week.   [name], Marlinda Mike, MSW Clinical Social Worker  [phone] 09/27/2018  10:09 AM

## 2018-09-28 NOTE — Care Plan (Signed)
Ortho Bundle Case Management Note  Patient Details  Name: Christina Zhang MRN: [id number] Date of Birth: [birth year]  DCP:  Home with dtr DME:  No needs HHC orders given to [facility].                     DME Arranged:  N/A DME Agency:  NA  HH Arranged:  PT HH Agency:  [facility] (formerly [facility])  Additional Comments: Please contact me with any questions of if this plan should need to change.  Marianne Sofia, RN,CCM [facility]  [phone] 09/28/2018, 10:48 AM

## 2018-09-28 NOTE — Progress Notes (Signed)
Physical Therapy Treatment Patient Details Name: Christina Zhang MRN: [id number] DOB: Sep 09, 1945 Today's Date: 09/28/2018    History of Present Illness Christina Zhang is a 73 y.o. female with medical history significant of hypertension and recent total right hip arthroplasty on 09/19/2018 presented to the emergency department with a complaint of hip pain.  According to patient, she tried to stand up from her bed today and she was fine up until then and then she started hurting her right hip when she started take her foot forward and felt a pull.  She was unable to move her right leg so she was helped out by her daughter nearby.  EMS was called and patient was brought into the emergency department.  Initial x-ray of hip was negative.  Orthopedics was consulted and recommended CT of the hip which revealed acute nondisplaced periprosthetic fracture of the greater trochanter.  Orthopedics recommended admission to hospital. No surgery recommended per chart review.     PT Comments    Pt declined premedication however able to better tolerate ambulating today.  Pt overall min/guard for mobility for safety at this time.  Pt performed LE exercises and provided with handout.  Educated on gentle exercises and no resistance.  Pt aware of TDWB however demonstrates more PWB and encouraged to perform less WBing if able to assist with pain and healing.  Pt had no further questions and feels ready to d/c home with her daughter today.   Follow Up Recommendations  Home health PT;Supervision/Assistance - 24 hour     Equipment Recommendations  None recommended by PT    Recommendations for Other Services       Precautions / Restrictions Precautions Precautions: Fall Restrictions Weight Bearing Restrictions: Yes RLE Weight Bearing: Touchdown weight bearing Other Position/Activity Restrictions: R LE TDWB in order however ortho notes are stating PWB, pt reports PWB okay by MD    Mobility  Bed Mobility Overal  bed mobility: Needs Assistance Bed Mobility: Supine to Sit     Supine to sit: Min guard;HOB elevated     General bed mobility comments: pt self assist for R LE, increased time and effort  Transfers Overall transfer level: Needs assistance Equipment used: Rolling walker (2 wheeled) Transfers: Sit to/from Stand Sit to Stand: Min guard         General transfer comment: verbal cues for TDWB, UE and LE positioning  Ambulation/Gait Ambulation/Gait assistance: Min guard Gait Distance (Feet): 40 Feet Assistive device: Rolling walker (2 wheeled) Gait Pattern/deviations: Step-to pattern;Antalgic     General Gait Details: cues for TDWB/PWB, UEs through RW to assist with maintaining WB restrictions, able to improve distance, reports pain improved compared to yesterday   Stairs             Wheelchair Mobility    Modified Rankin (Stroke Patients Only)       Balance                                            Cognition Arousal/Alertness: Awake/alert Behavior During Therapy: WFL for tasks assessed/performed Overall Cognitive Status: Within Functional Limits for tasks assessed                                        Exercises Total Joint Exercises Ankle Circles/Pumps: AROM;10  reps;Both Quad Sets: AROM;10 reps;Both Short Arc Quad: AROM;10 reps;Right Heel Slides: AAROM;10 reps;Right Hip ABduction/ADduction: AAROM;10 reps;Right    General Comments        Pertinent Vitals/Pain Pain Assessment: 0-10 Pain Score: 5  Pain Location: Right hip Pain Descriptors / Indicators: Aching;Sore Pain Intervention(s): Monitored during session;Repositioned    Home Living                      Prior Function            PT Goals (current goals can now be found in the care plan section) Progress towards PT goals: Progressing toward goals    Frequency    7X/week      PT Plan Discharge plan needs to be updated     Co-evaluation              AM-PAC PT "6 Clicks" Mobility   Outcome Measure  Help needed turning from your back to your side while in a flat bed without using bedrails?: A Little Help needed moving from lying on your back to sitting on the side of a flat bed without using bedrails?: A Little Help needed moving to and from a bed to a chair (including a wheelchair)?: A Little Help needed standing up from a chair using your arms (e.g., wheelchair or bedside chair)?: A Little Help needed to walk in hospital room?: A Little Help needed climbing 3-5 steps with a railing? : A Lot 6 Click Score: 17    End of Session Equipment Utilized During Treatment: Gait belt Activity Tolerance: Patient limited by pain Patient left: with call bell/phone within reach;in chair   PT Visit Diagnosis: Muscle weakness (generalized) (M62.81);Difficulty in walking, not elsewhere classified (R26.2)     Time: 2440-1027 PT Time Calculation (min) (ACUTE ONLY): 25 min  Charges:  $Gait Training: 8-22 mins $Therapeutic Exercise: 8-22 mins                     Carmelia Bake, PT, DPT Acute Rehabilitation Services Office: [phone] Pager: [phone]  Trena Platt 09/28/2018, 12:33 PM

## 2018-09-28 NOTE — Progress Notes (Signed)
   Subjective:    Patient reports pain as moderate.   Patient seen in rounds with Dr. Wynelle Link. Patient is well, and has had no acute complaints or problems other than pain in the right hip. She had nausea/vomiting yesterday, which has resolved since switching back to Dilaudid. Voiding without difficulty, positive flatus.  We will continue therapy today.   Objective: Vital signs in last 24 hours: Temp:  [97.5 F (36.4 C)-98.5 F (36.9 C)] 98.5 F (36.9 C) (05/20 0612) Pulse Rate:  [77-78] 77 (05/20 0612) Resp:  [16-19] 16 (05/20 0612) BP: (119-142)/(62-74) 119/62 (05/20 0612) SpO2:  [96 %-98 %] 96 % (05/20 0612)  Intake/Output from previous day:  Intake/Output Summary (Last 24 hours) at 09/28/2018 0816 Last data filed at 09/28/2018 0600 Gross per 24 hour  Intake 363 ml  Output 1575 ml  Net -1212 ml     Intake/Output this shift: No intake/output data recorded.  Labs: Recent Labs    09/26/18 0315  HGB 9.9*   Recent Labs    09/26/18 0315  WBC 10.0  RBC 3.17*  HCT 30.2*  PLT 435*   Recent Labs    09/26/18 0315 09/27/18 0427  NA 139 139  K 3.5 3.5  CL 107 107  CO2 24 22  BUN 12 11  CREATININE 0.58 0.59  GLUCOSE 91 96  CALCIUM 8.5* 8.7*   No results for input(s): LABPT, INR in the last 72 hours.  Exam: General - Patient is Alert and Oriented Extremity - Neurologically intact Sensation intact distally Intact pulses distally Dorsiflexion/Plantar flexion intact Dressing - no drainage Motor Function - intact, moving foot and toes well on exam.   Past Medical History:  Diagnosis Date  . Depression   . Diverticulosis   . Hypertension   . OA (osteoarthritis)    left hip  . PONV (postoperative nausea and vomiting)    after ortoscopic knee surgery yrs ago  . Wears glasses     Assessment/Plan:    Principal Problem:   Periprosthetic fracture around internal prosthetic right hip joint (HCC) Active Problems:   Hip fracture requiring operative repair,  right, closed, with delayed healing, subsequent encounter   Essential hypertension   Depression   Anemia   Vitamin B12 deficiency  Estimated body mass index is 35.05 kg/m as calculated from the following:   Height as of this encounter: 5\' 6"  (1.676 m).   Weight as of this encounter: 98.5 kg. Advance diet Up with therapy D/C IV fluids  DVT Prophylaxis - Lovenox while in the hospital, switch back to Aspirin protocol at discharge PWB to the RLE  Plan is to go Home after hospital stay. She will be PWB to the RLE until follow up.She had nausea/vomiting during PT yesterday, which was related to her pain medication. She was switched back to Dilaudid yesterday evening, and feels as though this has improved. Plan for discharge today after 1-2 sessions of therapy if she is meeting her goals. She will resume the aspirin protocol at discharge as previously scheduled. Follow up in the office with Dr. Wynelle Link in 1 week as scheduled.  Griffith Citron, PA-C Orthopedic Surgery 09/28/2018, 8:16 AM

## 2018-10-01 DIAGNOSIS — M9701XD Periprosthetic fracture around internal prosthetic right hip joint, subsequent encounter: Secondary | ICD-10-CM | POA: Diagnosis not present

## 2018-10-01 DIAGNOSIS — S72111D Displaced fracture of greater trochanter of right femur, subsequent encounter for closed fracture with routine healing: Secondary | ICD-10-CM | POA: Diagnosis not present

## 2018-10-01 DIAGNOSIS — D649 Anemia, unspecified: Secondary | ICD-10-CM | POA: Diagnosis not present

## 2018-10-01 DIAGNOSIS — E538 Deficiency of other specified B group vitamins: Secondary | ICD-10-CM | POA: Diagnosis not present

## 2018-10-01 DIAGNOSIS — F329 Major depressive disorder, single episode, unspecified: Secondary | ICD-10-CM | POA: Diagnosis not present

## 2018-10-01 DIAGNOSIS — I10 Essential (primary) hypertension: Secondary | ICD-10-CM | POA: Diagnosis not present

## 2018-10-02 NOTE — Discharge Summary (Signed)
Physician Discharge Summary   Patient ID: Christina Zhang MRN: [id number] DOB/AGE: 71-Jun-1947 71 y.o.  Admit date: 09/24/2018 Discharge date: 09/28/2018  Primary Diagnosis: Non-displaced right greater trochanter periprosthetic fracture  Admission Diagnoses:  Past Medical History:  Diagnosis Date   Depression    Diverticulosis    Hypertension    OA (osteoarthritis)    left hip   PONV (postoperative nausea and vomiting)    after ortoscopic knee surgery yrs ago   Wears glasses    Discharge Diagnoses:   Principal Problem:   Periprosthetic fracture around internal prosthetic right hip joint ([facility]) Active Problems:   Hip fracture requiring operative repair, right, closed, with delayed healing, subsequent encounter   Essential hypertension   Depression   Anemia   Vitamin B12 deficiency  Estimated body mass index is 35.05 kg/m as calculated from the following:   Height as of this encounter: 5\' 6"  (1.676 m).   Weight as of this encounter: 98.5 kg.    Consults: orthopedic surgery  HPI: Christina Zhang had sudden onset of right hip pain on 09/24/18 necessitating evaluation in the ED and subsequent admission for a periprosthetic femur fracture. Her CT showed a non-displaced greater trochanter fracture.   Laboratory Data: Admission on 09/24/2018, Discharged on 09/28/2018  Component Date Value Ref Range Status   SARS Coronavirus 2 09/24/2018 NEGATIVE  NEGATIVE Final   Comment: (NOTE) If result is NEGATIVE SARS-CoV-2 target nucleic acids are NOT DETECTED. The SARS-CoV-2 RNA is generally detectable in upper and lower  respiratory specimens during the acute phase of infection. The lowest  concentration of SARS-CoV-2 viral copies this assay can detect is 250  copies / mL. A negative result does not preclude SARS-CoV-2 infection  and should not be used as the sole basis for treatment or other  patient management decisions.  A negative result may occur with  improper specimen  collection / handling, submission of specimen other  than nasopharyngeal swab, presence of viral mutation(s) within the  areas targeted by this assay, and inadequate number of viral copies  (<250 copies / mL). A negative result must be combined with clinical  observations, patient history, and epidemiological information. If result is POSITIVE SARS-CoV-2 target nucleic acids are DETECTED. The SARS-CoV-2 RNA is generally detectable in upper and lower  respiratory specimens dur                          ing the acute phase of infection.  Positive  results are indicative of active infection with SARS-CoV-2.  Clinical  correlation with patient history and other diagnostic information is  necessary to determine patient infection status.  Positive results do  not rule out bacterial infection or co-infection with other viruses. If result is PRESUMPTIVE POSTIVE SARS-CoV-2 nucleic acids MAY BE PRESENT.   A presumptive positive result was obtained on the submitted specimen  and confirmed on repeat testing.  While 2019 novel coronavirus  (SARS-CoV-2) nucleic acids may be present in the submitted sample  additional confirmatory testing may be necessary for epidemiological  and / or clinical management purposes  to differentiate between  SARS-CoV-2 and other Sarbecovirus currently known to infect humans.  If clinically indicated additional testing with an alternate test  methodology [id number]) is advised. The SARS-CoV-2 RNA is generally  detectable in upper and lower respiratory sp  ecimens during the acute  phase of infection. The expected result is Negative. Fact Sheet for Patients:  StrictlyIdeas.no Fact Sheet for Healthcare Providers: BankingDealers.co.za This test is not yet approved or cleared by the [place] FDA and has been authorized for detection and/or diagnosis of SARS-CoV-2 by FDA under an Emergency Use  Authorization (EUA).  This EUA will remain in effect (meaning this test can be used) for the duration of the COVID-19 declaration under Section 564(b)(1) of the Act, 21 U.S.C. section 360bbb-3(b)(1), unless the authorization is terminated or revoked sooner. Performed at [facility], [place] [address] [place], [place] [address]    WBC 09/24/2018 9.7  4.0 - 10.5 K/uL Final   RBC 09/24/2018 3.45* 3.87 - 5.11 MIL/uL Final   Hemoglobin 09/24/2018 10.7* 12.0 - 15.0 g/dL Final   HCT 09/24/2018 32.7* 36.0 - 46.0 % Final   MCV 09/24/2018 94.8  80.0 - 100.0 fL Final   MCH 09/24/2018 31.0  26.0 - 34.0 pg Final   MCHC 09/24/2018 32.7  30.0 - 36.0 g/dL Final   RDW 09/24/2018 12.4  11.5 - 15.5 % Final   Platelets 09/24/2018 458* 150 - 400 K/uL Final   nRBC 09/24/2018 0.0  0.0 - 0.2 % Final   Neutrophils Relative % 09/24/2018 64  % Final   Neutro Abs 09/24/2018 6.2  1.7 - 7.7 K/uL Final   Lymphocytes Relative 09/24/2018 24  % Final   Lymphs Abs 09/24/2018 2.3  0.7 - 4.0 K/uL Final   Monocytes Relative 09/24/2018 10  % Final   Monocytes Absolute 09/24/2018 0.9  0.1 - 1.0 K/uL Final   Eosinophils Relative 09/24/2018 1  % Final   Eosinophils Absolute 09/24/2018 0.1  0.0 - 0.5 K/uL Final   Basophils Relative 09/24/2018 0  % Final   Basophils Absolute 09/24/2018 0.0  0.0 - 0.1 K/uL Final   Immature Granulocytes 09/24/2018 1  % Final   Abs Immature Granulocytes 09/24/2018 0.11* 0.00 - 0.07 K/uL Final   Performed at [facility], [place] [address] [place], [place] [address]   Sodium 09/24/2018 138  135 - 145 mmol/L Final   Potassium 09/24/2018 3.4* 3.5 - 5.1 mmol/L Final   Chloride 09/24/2018 103  98 - 111 mmol/L Final   CO2 09/24/2018 25  22 - 32 mmol/L Final   Glucose, Bld 09/24/2018 98  70 - 99 mg/dL Final   BUN 09/24/2018 11  8 - 23 mg/dL Final   Creatinine, Ser 09/24/2018 0.59  0.44 - 1.00 mg/dL Final   Calcium 09/24/2018 8.8* 8.9  - 10.3 mg/dL Final   GFR calc non Af Amer 09/24/2018 >60  >60 mL/min Final   GFR calc Af Amer 09/24/2018 >60  >60 mL/min Final   Anion gap 09/24/2018 10  5 - 15 Final   Performed at [facility], [place] [address], [place], [place] [address]   HIV Screen 4th Generation wRfx 09/24/2018 Non Reactive  Non Reactive Final   Comment: (NOTE) Performed At: [facility] [place], [place] [id number] Rush Farmer MD SW:[id number]    Sodium 09/25/2018 139  135 - 145 mmol/L Final   Potassium 09/25/2018 3.3* 3.5 - 5.1 mmol/L Final   Chloride 09/25/2018 106  98 - 111 mmol/L Final   CO2 09/25/2018 24  22 - 32 mmol/L Final   Glucose, Bld 09/25/2018 92  70 - 99 mg/dL Final   BUN 09/25/2018 14  8 - 23 mg/dL Final   Creatinine,  Ser 09/25/2018 0.55  0.44 - 1.00 mg/dL Final   Calcium 09/25/2018 8.6* 8.9 - 10.3 mg/dL Final   Total Protein 09/25/2018 6.2* 6.5 - 8.1 g/dL Final   Albumin 09/25/2018 2.9* 3.5 - 5.0 g/dL Final   AST 09/25/2018 23  15 - 41 U/L Final   ALT 09/25/2018 21  0 - 44 U/L Final   Alkaline Phosphatase 09/25/2018 85  38 - 126 U/L Final   Total Bilirubin 09/25/2018 0.7  0.3 - 1.2 mg/dL Final   GFR calc non Af Amer 09/25/2018 >60  >60 mL/min Final   GFR calc Af Amer 09/25/2018 >60  >60 mL/min Final   Anion gap 09/25/2018 9  5 - 15 Final   Performed at [facility], [place] [address] [place], [place] [address]   WBC 09/25/2018 8.6  4.0 - 10.5 K/uL Final   RBC 09/25/2018 3.22* 3.87 - 5.11 MIL/uL Final   Hemoglobin 09/25/2018 9.9* 12.0 - 15.0 g/dL Final   HCT 09/25/2018 31.0* 36.0 - 46.0 % Final   MCV 09/25/2018 96.3  80.0 - 100.0 fL Final   MCH 09/25/2018 30.7  26.0 - 34.0 pg Final   MCHC 09/25/2018 31.9  30.0 - 36.0 g/dL Final   RDW 09/25/2018 12.5  11.5 - 15.5 % Final   Platelets 09/25/2018 390  150 - 400 K/uL Final   nRBC 09/25/2018 0.0  0.0 - 0.2 % Final   Performed at [facility], [place] [address] [place], [place] [address]   Vitamin B-12 09/26/2018 174* 180 - 914 pg/mL Final   Comment: (NOTE) This assay is not validated for testing neonatal or myeloproliferative syndrome specimens for Vitamin B12 levels. Performed at [facility], [place] [address] [place], [place] [address]    Folate 09/26/2018 9.2  >5.9 ng/mL Final   Performed at [facility], [place] [address] [place], [place] [address]   Iron 09/26/2018 41  28 - 170 ug/dL Final   TIBC 09/26/2018 310  250 - 450 ug/dL Final   Saturation Ratios 09/26/2018 13  10.4 - 31.8 % Final   UIBC 09/26/2018 269  ug/dL Final   Performed at [place] [address], [place], [place] [address]   Ferritin 09/26/2018 70  11 - 307 ng/mL Final   Performed at [place] [address], [place], [place] [address]   Sodium 09/26/2018 139  135 - 145 mmol/L Final   Potassium 09/26/2018 3.5  3.5 - 5.1 mmol/L Final   Chloride 09/26/2018 107  98 - 111 mmol/L Final   CO2 09/26/2018 24  22 - 32 mmol/L Final   Glucose, Bld 09/26/2018 91  70 - 99 mg/dL Final   BUN 09/26/2018 12  8 - 23 mg/dL Final   Creatinine, Ser 09/26/2018 0.58  0.44 - 1.00 mg/dL Final   Calcium 09/26/2018 8.5* 8.9 - 10.3 mg/dL Final   GFR calc non Af Amer 09/26/2018 >60  >60 mL/min Final   GFR calc Af Amer 09/26/2018 >60  >60 mL/min Final   Anion gap 09/26/2018 8  5 - 15 Final   Performed at [facility], [place] [address] [place], [place] [address]   WBC 09/26/2018 10.0  4.0 - 10.5 K/uL Final   RBC 09/26/2018 3.17* 3.87 - 5.11 MIL/uL Final   Hemoglobin 09/26/2018 9.9* 12.0 - 15.0 g/dL Final   HCT 09/26/2018 30.2* 36.0 - 46.0 % Final   MCV 09/26/2018 95.3  80.0 -  100.0 fL Final   MCH 09/26/2018 31.2  26.0 - 34.0 pg Final   MCHC 09/26/2018 32.8  30.0 - 36.0 g/dL Final   RDW 09/26/2018 12.5  11.5 - 15.5 % Final   Platelets 09/26/2018  435* 150 - 400 K/uL Final   nRBC 09/26/2018 0.0  0.0 - 0.2 % Final   Performed at [facility], [place] [address] [place], [place] [address]   Sodium 09/27/2018 139  135 - 145 mmol/L Final   Potassium 09/27/2018 3.5  3.5 - 5.1 mmol/L Final   Chloride 09/27/2018 107  98 - 111 mmol/L Final   CO2 09/27/2018 22  22 - 32 mmol/L Final   Glucose, Bld 09/27/2018 96  70 - 99 mg/dL Final   BUN 09/27/2018 11  8 - 23 mg/dL Final   Creatinine, Ser 09/27/2018 0.59  0.44 - 1.00 mg/dL Final   Calcium 09/27/2018 8.7* 8.9 - 10.3 mg/dL Final   GFR calc non Af Amer 09/27/2018 >60  >60 mL/min Final   GFR calc Af Amer 09/27/2018 >60  >60 mL/min Final   Anion gap 09/27/2018 10  5 - 15 Final   Performed at [facility], [place] [address] [place], [place] [address]  Admission on 09/19/2018, Discharged on 09/20/2018  Component Date Value Ref Range Status   WBC 09/20/2018 11.9* 4.0 - 10.5 K/uL Final   RBC 09/20/2018 3.28* 3.87 - 5.11 MIL/uL Final   Hemoglobin 09/20/2018 10.0* 12.0 - 15.0 g/dL Final   HCT 09/20/2018 31.4* 36.0 - 46.0 % Final   MCV 09/20/2018 95.7  80.0 - 100.0 fL Final   MCH 09/20/2018 30.5  26.0 - 34.0 pg Final   MCHC 09/20/2018 31.8  30.0 - 36.0 g/dL Final   RDW 09/20/2018 12.5  11.5 - 15.5 % Final   Platelets 09/20/2018 279  150 - 400 K/uL Final   nRBC 09/20/2018 0.0  0.0 - 0.2 % Final   Performed at [facility], [place] [address], [place], [place] [address]   Sodium 09/20/2018 140  135 - 145 mmol/L Final   Potassium 09/20/2018 3.4* 3.5 - 5.1 mmol/L Final   Chloride 09/20/2018 107  98 - 111 mmol/L Final   CO2 09/20/2018 26  22 - 32 mmol/L Final   Glucose, Bld 09/20/2018 139* 70 - 99 mg/dL Final   BUN 09/20/2018 13  8 - 23 mg/dL Final   Creatinine, Ser 09/20/2018 0.70  0.44 - 1.00 mg/dL Final   Calcium 09/20/2018 8.3* 8.9 - 10.3 mg/dL Final   GFR calc non Af Amer 09/20/2018 >60  >60 mL/min Final   GFR calc  Af Amer 09/20/2018 >60  >60 mL/min Final   Anion gap 09/20/2018 7  5 - 15 Final   Performed at [facility], [place] [address] [place], [place] [address]  Hospital Outpatient Visit on 09/15/2018  Component Date Value Ref Range Status   SARS-CoV-2, NAA 09/15/2018 NOT DETECTED  NOT DETECTED Final   Comment: (NOTE) This test was developed and its performance characteristics determined by [employer]. This test has not been FDA cleared or approved. This test has been authorized by FDA under an Emergency Use Authorization (EUA). This test is only authorized for the duration of time the declaration that circumstances exist justifying the authorization of the emergency use of in vitro diagnostic tests for detection of SARS-CoV-2 virus and/or diagnosis of COVID-19 infection under section 564(b)(1) of the Act, 21 U.S.C. 539JQB-3(A)(1), unless the authorization is terminated  or revoked sooner. When diagnostic testing is negative, the possibility of a false negative result should be considered in the context of a patient's recent exposures and the presence of clinical signs and symptoms consistent with COVID-19. An individual without symptoms of COVID-19 and who is not shedding SARS-CoV-2 virus would expect to have a negative (not detected) result in this assay. Performed                           At: [facility] [place], [place] [id number] Rush Farmer MD [id number]    Coronavirus Source 09/15/2018 NASOPHARYNGEAL   Final   Performed at [facility], [place] [address] [place], [place] [address]  Hospital Outpatient Visit on 09/14/2018  Component Date Value Ref Range Status   MRSA, PCR 09/14/2018 NEGATIVE  NEGATIVE Final   Staphylococcus aureus 09/14/2018 NEGATIVE  NEGATIVE Final   Comment: (NOTE) The Xpert SA Assay (FDA approved for NASAL specimens in patients 89 years of age and older), is one component of a  comprehensive surveillance program. It is not intended to diagnose infection nor to guide or monitor treatment. Performed at [facility], [place] [address], [place], [place] [address]    aPTT 09/14/2018 31  24 - 36 seconds Final   Performed at [facility], [place] [address], [place], [place] [address]   WBC 09/14/2018 6.7  4.0 - 10.5 K/uL Final   RBC 09/14/2018 4.64  3.87 - 5.11 MIL/uL Final   Hemoglobin 09/14/2018 14.5  12.0 - 15.0 g/dL Final   HCT 09/14/2018 43.4  36.0 - 46.0 % Final   MCV 09/14/2018 93.5  80.0 - 100.0 fL Final   MCH 09/14/2018 31.3  26.0 - 34.0 pg Final   MCHC 09/14/2018 33.4  30.0 - 36.0 g/dL Final   RDW 09/14/2018 12.3  11.5 - 15.5 % Final   Platelets 09/14/2018 418* 150 - 400 K/uL Final   nRBC 09/14/2018 0.0  0.0 - 0.2 % Final   Performed at [facility], [place] [address], [place], [place] [address]   Sodium 09/14/2018 139  135 - 145 mmol/L Final   Potassium 09/14/2018 3.5  3.5 - 5.1 mmol/L Final   Chloride 09/14/2018 100  98 - 111 mmol/L Final   CO2 09/14/2018 28  22 - 32 mmol/L Final   Glucose, Bld 09/14/2018 112* 70 - 99 mg/dL Final   BUN 09/14/2018 14  8 - 23 mg/dL Final   Creatinine, Ser 09/14/2018 0.78  0.44 - 1.00 mg/dL Final   Calcium 09/14/2018 10.0  8.9 - 10.3 mg/dL Final   Total Protein 09/14/2018 8.3* 6.5 - 8.1 g/dL Final   Albumin 09/14/2018 4.7  3.5 - 5.0 g/dL Final   AST 09/14/2018 23  15 - 41 U/L Final   ALT 09/14/2018 19  0 - 44 U/L Final   Alkaline Phosphatase 09/14/2018 99  38 - 126 U/L Final   Total Bilirubin 09/14/2018 0.7  0.3 - 1.2 mg/dL Final   GFR calc non Af Amer 09/14/2018 >60  >60 mL/min Final   GFR calc Af Amer 09/14/2018 >60  >60 mL/min Final   Anion gap 09/14/2018 11  5 - 15 Final   Performed at [facility], [place] [address], [place], [place] [address]   Prothrombin Time 09/14/2018 13.3  11.4 - 15.2 seconds Final   INR  09/14/2018 1.0  0.8 - 1.2 Final   Comment: (  NOTE) INR goal varies based on device and disease states. Performed at [facility], [place] [address], [place], [place] [address]    ABO/RH(D) 09/14/2018 A POS   Final   Antibody Screen 09/14/2018 NEG   Final   Sample Expiration 09/14/2018 09/22/2018,2359   Final   Extend sample reason 09/14/2018    Final                   Value:NO TRANSFUSIONS OR PREGNANCY IN THE PAST 3 MONTHS Performed at [place] [address] [place], [place] [address]      X-Rays:Dg Pelvis Portable  Result Date: 09/19/2018 CLINICAL DATA:  Right hip replacement EXAM: PORTABLE PELVIS 1-2 VIEWS COMPARISON:  06/30/2017 FINDINGS: Remote changes of left hip replacement and new changes of right hip replacement. Normal AP alignment. No hardware bony complicating feature. IMPRESSION: Right hip replacement.  No complicating feature. Electronically Signed   By: Rolm Baptise M.D.   On: 09/19/2018 12:20   Ct Hip Right Wo Contrast  Result Date: 09/24/2018 CLINICAL DATA:  Felt a pop in the right hip while walking. Recent right hip replacement 5 days ago. EXAM: CT OF THE RIGHT HIP WITHOUT CONTRAST TECHNIQUE: Multidetector CT imaging of the right hip was performed according to the standard protocol. Multiplanar CT image reconstructions were also generated. COMPARISON:  Pelvic x-rays from same day and Sep 19, 2018. FINDINGS: Bones/Joint/Cartilage Prior right total hip arthroplasty. There is an acute nondisplaced periprosthetic fracture of the greater trochanter. No dislocation. No large joint effusion. Chronic subchondral cystic change in the acetabulum. Ligaments Suboptimally assessed by CT. Muscles and Tendons Grossly intact. Soft tissues Small amount of soft tissue edema with a few foci of subcutaneous emphysema in the anterior right hip, likely postsurgical. No fluid collection or soft tissue mass. IMPRESSION: 1. Prior right total hip arthroplasty  with acute nondisplaced periprosthetic fracture of the greater trochanter. Electronically Signed   By: Titus Dubin M.D.   On: 09/24/2018 17:14   Dg Chest Port 1 View  Result Date: 09/24/2018 CLINICAL DATA:  Preoperative imaging. EXAM: PORTABLE CHEST 1 VIEW COMPARISON:  12/03/2011 FINDINGS: Lungs are clear. Heart and mediastinum are within normal limits. Trachea is midline. Negative for a pneumothorax. No acute bone abnormality. Degenerative changes at the right [facility] joint. IMPRESSION: No acute cardiopulmonary disease. Electronically Signed   By: Markus Daft M.D.   On: 09/24/2018 17:57   Dg C-arm 1-60 Min-no Report  Result Date: 09/19/2018 Fluoroscopy was utilized by the requesting physician.  No radiographic interpretation.   Dg Hip Operative Unilat With Pelvis Right  Result Date: 09/19/2018 CLINICAL DATA:  Elective surgery. EXAM: OPERATIVE right HIP (WITH PELVIS IF PERFORMED) 2 VIEWS TECHNIQUE: Fluoroscopic spot image(s) were submitted for interpretation post-operatively. COMPARISON:  06/30/2017 FINDINGS: Interval placement of right total hip arthroplasty with prosthesis intact and normally located. Recommend correlation with findings at the time of the procedure. Partially visualized left hip arthroplasty unchanged. IMPRESSION: Right hip arthroplasty without complicating features. Electronically Signed   By: Marin Olp M.D.   On: 09/19/2018 11:41   Dg Hip Unilat W Or Wo Pelvis 2-3 Views Right  Result Date: 09/24/2018 CLINICAL DATA:  Hip replacement Sep 19, 2018. Pop while walking. Unable to move right leg. EXAM: DG HIP (WITH OR WITHOUT PELVIS) 2-3V RIGHT COMPARISON:  None. FINDINGS: There is no evidence of hip fracture or dislocation. There is no evidence of arthropathy or other focal bone abnormality. IMPRESSION: Negative. Electronically Signed   By:  Dorise Bullion III M.D   On: 09/24/2018 14:19    EKG: Orders placed or performed during the hospital encounter of 09/24/18   ED EKG   ED  EKG   EKG     Hospital Course: Christina Zhang is a 73 y.o. who was admitted to [facility] for non-displaced right greater trochanter periprosthetic fracture, 5 days s/p right total hip arthroplasty. Orthopedics was consulted. She was admitted by Hospitalist service for pain control. Physical therapy was consulted for assistance with TDWB to the RLE. She was continued on her post-operative Aspirin protocol. Continued with DVT prophylaxis, pain control, and PT on 09/25/18. Dr. Wynelle Link saw the patient on 09/26/18, and determined that she would not need surgical treatment. She was transferred to Orthopedics service. She was changed to [facility] to the RLE. On 09/27/18, she was doing well during rounds. Home health PT was ordered for safe discharge home. She continued to work with therapy. She reported some nausea/vomiting associated with her pain medication, which resolved when switched to Dilaudid. On 09/28/18, she was seen in rounds and was ready to go home pending progress with therapy. Patient worked with therapy for one additional session and was meeting their goals. She was discharged to home later that day in stable condition. Anti-infectives (From admission, onward)   None      Diet: Regular diet Activity: PWB Follow-up: in 1 weeks Disposition: Home Discharged Condition: good   Discharge Instructions    Call MD / Call 911   Complete by:  As directed    If you experience chest pain or shortness of breath, CALL 911 and be transported to the hospital emergency room.  If you develope a fever above 101 F, pus (white drainage) or increased drainage or redness at the wound, or calf pain, call your surgeon's office.   Constipation Prevention   Complete by:  As directed    Drink plenty of fluids.  Prune juice may be helpful.  You may use a stool softener, such as Colace (over the counter) 100 mg twice a day.  Use MiraLax (over the counter) for constipation as needed.   Diet - low sodium heart  healthy   Complete by:  As directed    Partial weight bearing   Complete by:  As directed      Allergies as of 09/28/2018      Reactions   Hydrocodone Nausea And Vomiting   vicodin   Metronidazole Swelling   lips swelled   Oxycodone Other (See Comments)   "MADE MY HEART RACE"   Tramadol Nausea And Vomiting   Ciprofloxacin Rash      Medication List    TAKE these medications   aspirin 325 MG EC tablet Take 1 tablet (325 mg total) by mouth 2 (two) times daily for 20 days. Then take one 81 mg aspirin once a day for three weeks. Then discontinue aspirin.   cholecalciferol 25 MCG (1000 UT) tablet Commonly known as:  VITAMIN D3 Take 1,000 Units by mouth daily.   hydrochlorothiazide 25 MG tablet Commonly known as:  HYDRODIURIL Take 12.5 mg by mouth every morning.   HYDROmorphone 2 MG tablet Commonly known as:  DILAUDID Take 1-2 tablets (2-4 mg total) by mouth every 6 (six) hours as needed for moderate pain.   loratadine 10 MG tablet Commonly known as:  CLARITIN Take 10 mg by mouth daily as needed for allergies.   methocarbamol 500 MG tablet Commonly known as:  ROBAXIN Take 1 tablet (  500 mg total) by mouth every 6 (six) hours as needed for muscle spasms.   sertraline 100 MG tablet Commonly known as:  ZOLOFT Take 100 mg by mouth daily.   vitamin B-12 1000 MCG tablet Commonly known as:  CYANOCOBALAMIN Take 1 tablet (1,000 mcg total) by mouth daily.            Discharge Care Instructions  (From admission, onward)         Start     Ordered   09/27/18 0000  Partial weight bearing     09/27/18 0747           Signed: Griffith Citron, PA-C Orthopedic Surgery 10/02/2018, 7:05 PM

## 2018-10-05 DIAGNOSIS — D649 Anemia, unspecified: Secondary | ICD-10-CM | POA: Diagnosis not present

## 2018-10-05 DIAGNOSIS — I10 Essential (primary) hypertension: Secondary | ICD-10-CM | POA: Diagnosis not present

## 2018-10-05 DIAGNOSIS — E538 Deficiency of other specified B group vitamins: Secondary | ICD-10-CM | POA: Diagnosis not present

## 2018-10-05 DIAGNOSIS — S72111D Displaced fracture of greater trochanter of right femur, subsequent encounter for closed fracture with routine healing: Secondary | ICD-10-CM | POA: Diagnosis not present

## 2018-10-05 DIAGNOSIS — F329 Major depressive disorder, single episode, unspecified: Secondary | ICD-10-CM | POA: Diagnosis not present

## 2018-10-05 DIAGNOSIS — M9701XD Periprosthetic fracture around internal prosthetic right hip joint, subsequent encounter: Secondary | ICD-10-CM | POA: Diagnosis not present

## 2018-10-07 DIAGNOSIS — S72111D Displaced fracture of greater trochanter of right femur, subsequent encounter for closed fracture with routine healing: Secondary | ICD-10-CM | POA: Diagnosis not present

## 2018-10-07 DIAGNOSIS — D649 Anemia, unspecified: Secondary | ICD-10-CM | POA: Diagnosis not present

## 2018-10-07 DIAGNOSIS — I10 Essential (primary) hypertension: Secondary | ICD-10-CM | POA: Diagnosis not present

## 2018-10-07 DIAGNOSIS — M9701XD Periprosthetic fracture around internal prosthetic right hip joint, subsequent encounter: Secondary | ICD-10-CM | POA: Diagnosis not present

## 2018-10-07 DIAGNOSIS — F329 Major depressive disorder, single episode, unspecified: Secondary | ICD-10-CM | POA: Diagnosis not present

## 2018-10-07 DIAGNOSIS — E538 Deficiency of other specified B group vitamins: Secondary | ICD-10-CM | POA: Diagnosis not present

## 2018-10-11 DIAGNOSIS — Z96641 Presence of right artificial hip joint: Secondary | ICD-10-CM | POA: Diagnosis not present

## 2018-10-11 DIAGNOSIS — M9701XD Periprosthetic fracture around internal prosthetic right hip joint, subsequent encounter: Secondary | ICD-10-CM | POA: Diagnosis not present

## 2018-10-12 DIAGNOSIS — F329 Major depressive disorder, single episode, unspecified: Secondary | ICD-10-CM | POA: Diagnosis not present

## 2018-10-12 DIAGNOSIS — D649 Anemia, unspecified: Secondary | ICD-10-CM | POA: Diagnosis not present

## 2018-10-12 DIAGNOSIS — S72111D Displaced fracture of greater trochanter of right femur, subsequent encounter for closed fracture with routine healing: Secondary | ICD-10-CM | POA: Diagnosis not present

## 2018-10-12 DIAGNOSIS — I10 Essential (primary) hypertension: Secondary | ICD-10-CM | POA: Diagnosis not present

## 2018-10-12 DIAGNOSIS — M9701XD Periprosthetic fracture around internal prosthetic right hip joint, subsequent encounter: Secondary | ICD-10-CM | POA: Diagnosis not present

## 2018-10-12 DIAGNOSIS — E538 Deficiency of other specified B group vitamins: Secondary | ICD-10-CM | POA: Diagnosis not present

## 2018-10-14 DIAGNOSIS — D649 Anemia, unspecified: Secondary | ICD-10-CM | POA: Diagnosis not present

## 2018-10-14 DIAGNOSIS — M9701XD Periprosthetic fracture around internal prosthetic right hip joint, subsequent encounter: Secondary | ICD-10-CM | POA: Diagnosis not present

## 2018-10-14 DIAGNOSIS — S72111D Displaced fracture of greater trochanter of right femur, subsequent encounter for closed fracture with routine healing: Secondary | ICD-10-CM | POA: Diagnosis not present

## 2018-10-14 DIAGNOSIS — F329 Major depressive disorder, single episode, unspecified: Secondary | ICD-10-CM | POA: Diagnosis not present

## 2018-10-14 DIAGNOSIS — E538 Deficiency of other specified B group vitamins: Secondary | ICD-10-CM | POA: Diagnosis not present

## 2018-10-14 DIAGNOSIS — I10 Essential (primary) hypertension: Secondary | ICD-10-CM | POA: Diagnosis not present

## 2018-11-01 DIAGNOSIS — Z96641 Presence of right artificial hip joint: Secondary | ICD-10-CM | POA: Diagnosis not present

## 2018-11-01 DIAGNOSIS — Z471 Aftercare following joint replacement surgery: Secondary | ICD-10-CM | POA: Diagnosis not present

## 2018-11-24 DIAGNOSIS — F331 Major depressive disorder, recurrent, moderate: Secondary | ICD-10-CM | POA: Diagnosis not present

## 2018-11-24 DIAGNOSIS — I10 Essential (primary) hypertension: Secondary | ICD-10-CM | POA: Diagnosis not present

## 2018-11-24 DIAGNOSIS — E785 Hyperlipidemia, unspecified: Secondary | ICD-10-CM | POA: Diagnosis not present

## 2018-12-01 DIAGNOSIS — E785 Hyperlipidemia, unspecified: Secondary | ICD-10-CM | POA: Diagnosis not present

## 2018-12-01 DIAGNOSIS — F331 Major depressive disorder, recurrent, moderate: Secondary | ICD-10-CM | POA: Diagnosis not present

## 2018-12-01 DIAGNOSIS — J301 Allergic rhinitis due to pollen: Secondary | ICD-10-CM | POA: Diagnosis not present

## 2018-12-01 DIAGNOSIS — E6609 Other obesity due to excess calories: Secondary | ICD-10-CM | POA: Diagnosis not present

## 2018-12-08 DIAGNOSIS — E559 Vitamin D deficiency, unspecified: Secondary | ICD-10-CM | POA: Diagnosis not present

## 2018-12-08 DIAGNOSIS — R5382 Chronic fatigue, unspecified: Secondary | ICD-10-CM | POA: Diagnosis not present

## 2018-12-08 DIAGNOSIS — D519 Vitamin B12 deficiency anemia, unspecified: Secondary | ICD-10-CM | POA: Diagnosis not present

## 2018-12-27 DIAGNOSIS — Z96641 Presence of right artificial hip joint: Secondary | ICD-10-CM | POA: Diagnosis not present

## 2019-01-19 DIAGNOSIS — H40033 Anatomical narrow angle, bilateral: Secondary | ICD-10-CM | POA: Diagnosis not present

## 2019-01-19 DIAGNOSIS — H04123 Dry eye syndrome of bilateral lacrimal glands: Secondary | ICD-10-CM | POA: Diagnosis not present

## 2019-01-24 DIAGNOSIS — M6281 Muscle weakness (generalized): Secondary | ICD-10-CM | POA: Diagnosis not present

## 2019-01-24 DIAGNOSIS — R2689 Other abnormalities of gait and mobility: Secondary | ICD-10-CM | POA: Diagnosis not present

## 2019-01-24 DIAGNOSIS — Z471 Aftercare following joint replacement surgery: Secondary | ICD-10-CM | POA: Diagnosis not present

## 2019-01-24 DIAGNOSIS — Z96641 Presence of right artificial hip joint: Secondary | ICD-10-CM | POA: Diagnosis not present

## 2019-01-27 DIAGNOSIS — R2689 Other abnormalities of gait and mobility: Secondary | ICD-10-CM | POA: Diagnosis not present

## 2019-01-27 DIAGNOSIS — M6281 Muscle weakness (generalized): Secondary | ICD-10-CM | POA: Diagnosis not present

## 2019-01-27 DIAGNOSIS — Z471 Aftercare following joint replacement surgery: Secondary | ICD-10-CM | POA: Diagnosis not present

## 2019-01-27 DIAGNOSIS — Z96641 Presence of right artificial hip joint: Secondary | ICD-10-CM | POA: Diagnosis not present

## 2019-01-30 DIAGNOSIS — Z471 Aftercare following joint replacement surgery: Secondary | ICD-10-CM | POA: Diagnosis not present

## 2019-01-30 DIAGNOSIS — Z96641 Presence of right artificial hip joint: Secondary | ICD-10-CM | POA: Diagnosis not present

## 2019-01-30 DIAGNOSIS — R2689 Other abnormalities of gait and mobility: Secondary | ICD-10-CM | POA: Diagnosis not present

## 2019-01-30 DIAGNOSIS — M6281 Muscle weakness (generalized): Secondary | ICD-10-CM | POA: Diagnosis not present

## 2019-02-02 DIAGNOSIS — R2689 Other abnormalities of gait and mobility: Secondary | ICD-10-CM | POA: Diagnosis not present

## 2019-02-02 DIAGNOSIS — Z471 Aftercare following joint replacement surgery: Secondary | ICD-10-CM | POA: Diagnosis not present

## 2019-02-02 DIAGNOSIS — Z96641 Presence of right artificial hip joint: Secondary | ICD-10-CM | POA: Diagnosis not present

## 2019-02-02 DIAGNOSIS — M6281 Muscle weakness (generalized): Secondary | ICD-10-CM | POA: Diagnosis not present

## 2019-02-07 DIAGNOSIS — R2689 Other abnormalities of gait and mobility: Secondary | ICD-10-CM | POA: Diagnosis not present

## 2019-02-07 DIAGNOSIS — Z471 Aftercare following joint replacement surgery: Secondary | ICD-10-CM | POA: Diagnosis not present

## 2019-02-07 DIAGNOSIS — Z96641 Presence of right artificial hip joint: Secondary | ICD-10-CM | POA: Diagnosis not present

## 2019-02-07 DIAGNOSIS — M6281 Muscle weakness (generalized): Secondary | ICD-10-CM | POA: Diagnosis not present

## 2019-02-09 DIAGNOSIS — Z471 Aftercare following joint replacement surgery: Secondary | ICD-10-CM | POA: Diagnosis not present

## 2019-02-09 DIAGNOSIS — Z96641 Presence of right artificial hip joint: Secondary | ICD-10-CM | POA: Diagnosis not present

## 2019-02-10 DIAGNOSIS — M6281 Muscle weakness (generalized): Secondary | ICD-10-CM | POA: Diagnosis not present

## 2019-02-10 DIAGNOSIS — R2689 Other abnormalities of gait and mobility: Secondary | ICD-10-CM | POA: Diagnosis not present

## 2019-02-10 DIAGNOSIS — Z471 Aftercare following joint replacement surgery: Secondary | ICD-10-CM | POA: Diagnosis not present

## 2019-02-10 DIAGNOSIS — Z96641 Presence of right artificial hip joint: Secondary | ICD-10-CM | POA: Diagnosis not present

## 2019-02-14 DIAGNOSIS — M6281 Muscle weakness (generalized): Secondary | ICD-10-CM | POA: Diagnosis not present

## 2019-02-14 DIAGNOSIS — Z96641 Presence of right artificial hip joint: Secondary | ICD-10-CM | POA: Diagnosis not present

## 2019-02-14 DIAGNOSIS — Z471 Aftercare following joint replacement surgery: Secondary | ICD-10-CM | POA: Diagnosis not present

## 2019-02-14 DIAGNOSIS — R2689 Other abnormalities of gait and mobility: Secondary | ICD-10-CM | POA: Diagnosis not present

## 2019-02-16 DIAGNOSIS — R2689 Other abnormalities of gait and mobility: Secondary | ICD-10-CM | POA: Diagnosis not present

## 2019-02-16 DIAGNOSIS — Z96641 Presence of right artificial hip joint: Secondary | ICD-10-CM | POA: Diagnosis not present

## 2019-02-16 DIAGNOSIS — M6281 Muscle weakness (generalized): Secondary | ICD-10-CM | POA: Diagnosis not present

## 2019-02-16 DIAGNOSIS — Z471 Aftercare following joint replacement surgery: Secondary | ICD-10-CM | POA: Diagnosis not present

## 2019-03-13 DIAGNOSIS — Z23 Encounter for immunization: Secondary | ICD-10-CM | POA: Diagnosis not present

## 2019-04-24 ENCOUNTER — Other Ambulatory Visit: Payer: Self-pay | Admitting: Family Medicine

## 2019-04-24 DIAGNOSIS — Z1231 Encounter for screening mammogram for malignant neoplasm of breast: Secondary | ICD-10-CM

## 2019-06-01 ENCOUNTER — Ambulatory Visit: Payer: Medicare Other | Attending: Internal Medicine

## 2019-06-01 ENCOUNTER — Other Ambulatory Visit: Payer: Self-pay

## 2019-06-01 DIAGNOSIS — Z23 Encounter for immunization: Secondary | ICD-10-CM | POA: Insufficient documentation

## 2019-06-01 NOTE — Progress Notes (Signed)
   Covid-19 Vaccination Clinic  Name:  NYOTA BRUMMELL    MRN: [id number] DOB: [birth year]  06/01/2019  Ms. Sticker was observed post Covid-19 immunization for 15 minutes without incidence. She was provided with Vaccine Information Sheet and instruction to access the V-Safe system.   Ms. Essien was instructed to call 911 with any severe reactions post vaccine: Marland Kitchen Difficulty breathing  . Swelling of your face and throat  . A fast heartbeat  . A bad rash all over your body  . Dizziness and weakness    Immunizations Administered    Name Date Dose VIS Date Route   Pfizer COVID-19 Vaccine 06/01/2019  5:40 PM 0.3 mL 04/21/2019 Intramuscular   Manufacturer: [place]   Lot: [id number]   [place]: [id number]

## 2019-06-09 DIAGNOSIS — E785 Hyperlipidemia, unspecified: Secondary | ICD-10-CM | POA: Diagnosis not present

## 2019-06-09 DIAGNOSIS — I10 Essential (primary) hypertension: Secondary | ICD-10-CM | POA: Diagnosis not present

## 2019-06-12 ENCOUNTER — Other Ambulatory Visit: Payer: Self-pay

## 2019-06-12 ENCOUNTER — Ambulatory Visit
Admission: RE | Admit: 2019-06-12 | Discharge: 2019-06-12 | Disposition: A | Discharge status: Home / Self Care | Payer: Medicare Other | Source: Ambulatory Visit | Attending: Family Medicine | Admitting: Family Medicine

## 2019-06-12 DIAGNOSIS — Z1231 Encounter for screening mammogram for malignant neoplasm of breast: Secondary | ICD-10-CM | POA: Diagnosis not present

## 2019-06-22 ENCOUNTER — Ambulatory Visit: Payer: Medicare Other | Attending: Internal Medicine

## 2019-06-22 DIAGNOSIS — Z23 Encounter for immunization: Secondary | ICD-10-CM

## 2019-06-22 NOTE — Progress Notes (Signed)
   Covid-19 Vaccination Clinic  Name:  Christina Zhang    MRN: [id number] DOB: 1946-02-22  06/22/2019  [name] was observed post Covid-19 immunization for 15 minutes without incidence. She was provided with Vaccine Information Sheet and instruction to access the V-Safe system.   [name] was instructed to call 911 with any severe reactions post vaccine: Marland Kitchen Difficulty breathing  . Swelling of your face and throat  . A fast heartbeat  . A bad rash all over your body  . Dizziness and weakness    Immunizations Administered    Name Date Dose VIS Date Route   Pfizer COVID-19 Vaccine 06/22/2019 11:15 AM 0.3 mL 04/21/2019 Intramuscular   Manufacturer: [employer], [employer]   Lot: [id number]   [place]: [id number]

## 2019-07-07 DIAGNOSIS — I10 Essential (primary) hypertension: Secondary | ICD-10-CM | POA: Diagnosis not present

## 2019-07-07 DIAGNOSIS — E7849 Other hyperlipidemia: Secondary | ICD-10-CM | POA: Diagnosis not present

## 2019-07-17 DIAGNOSIS — F331 Major depressive disorder, recurrent, moderate: Secondary | ICD-10-CM | POA: Diagnosis not present

## 2019-07-17 DIAGNOSIS — R5382 Chronic fatigue, unspecified: Secondary | ICD-10-CM | POA: Diagnosis not present

## 2019-07-17 DIAGNOSIS — E785 Hyperlipidemia, unspecified: Secondary | ICD-10-CM | POA: Diagnosis not present

## 2019-07-17 DIAGNOSIS — I10 Essential (primary) hypertension: Secondary | ICD-10-CM | POA: Diagnosis not present

## 2019-07-17 DIAGNOSIS — D649 Anemia, unspecified: Secondary | ICD-10-CM | POA: Diagnosis not present

## 2019-07-20 DIAGNOSIS — E6609 Other obesity due to excess calories: Secondary | ICD-10-CM | POA: Diagnosis not present

## 2019-07-20 DIAGNOSIS — E785 Hyperlipidemia, unspecified: Secondary | ICD-10-CM | POA: Diagnosis not present

## 2019-07-20 DIAGNOSIS — J301 Allergic rhinitis due to pollen: Secondary | ICD-10-CM | POA: Diagnosis not present

## 2019-07-20 DIAGNOSIS — F331 Major depressive disorder, recurrent, moderate: Secondary | ICD-10-CM | POA: Diagnosis not present

## 2019-12-07 IMAGING — DX DG PORTABLE PELVIS
1 series · 1 of 1 positions shown · non-contrast
Comparison: None.

CLINICAL DATA: Left hip replacement

EXAM:
PORTABLE PELVIS 1-2 VIEWS

[pelvis ap]
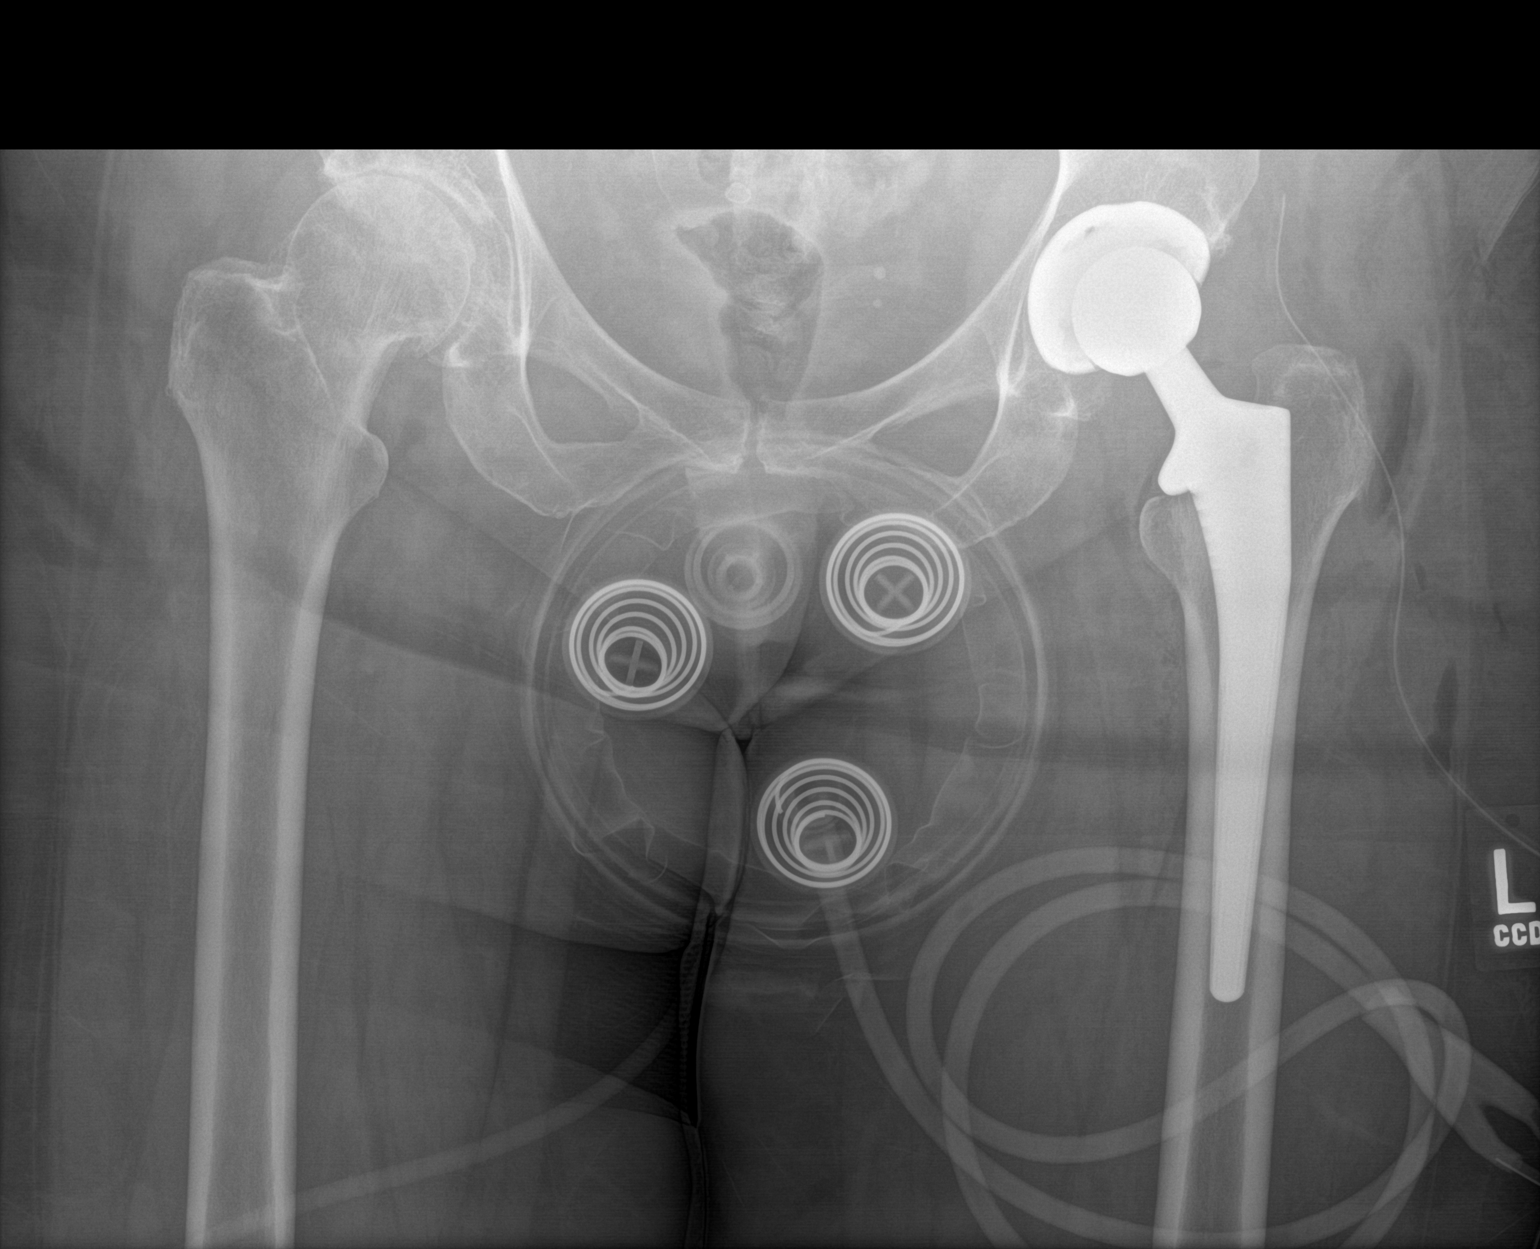

[1 of 1 positions shown; findings below may reference images not displayed]

FINDINGS: Interval left total hip arthroplasty without failure or
complication. No acute fracture or dislocation. Postsurgical changes
in the surrounding soft tissues with a surgical drain in place.

Severe superior right hip joint space narrowing with marginal
osteophytes, subchondral sclerosis and subchondral cystic changes
consistent with advanced osteoarthritis.
IMPRESSION: 1. Interval left total hip arthroplasty without hardware failure or
complication.
2. Severe osteoarthritis of the right hip.

## 2019-12-08 DIAGNOSIS — I10 Essential (primary) hypertension: Secondary | ICD-10-CM | POA: Diagnosis not present

## 2019-12-08 DIAGNOSIS — E7849 Other hyperlipidemia: Secondary | ICD-10-CM | POA: Diagnosis not present

## 2019-12-08 DIAGNOSIS — F331 Major depressive disorder, recurrent, moderate: Secondary | ICD-10-CM | POA: Diagnosis not present

## 2020-01-09 DIAGNOSIS — F331 Major depressive disorder, recurrent, moderate: Secondary | ICD-10-CM | POA: Diagnosis not present

## 2020-01-09 DIAGNOSIS — E7849 Other hyperlipidemia: Secondary | ICD-10-CM | POA: Diagnosis not present

## 2020-01-09 DIAGNOSIS — I10 Essential (primary) hypertension: Secondary | ICD-10-CM | POA: Diagnosis not present

## 2020-01-16 DIAGNOSIS — Z9189 Other specified personal risk factors, not elsewhere classified: Secondary | ICD-10-CM | POA: Diagnosis not present

## 2020-01-16 DIAGNOSIS — I10 Essential (primary) hypertension: Secondary | ICD-10-CM | POA: Diagnosis not present

## 2020-01-16 DIAGNOSIS — E785 Hyperlipidemia, unspecified: Secondary | ICD-10-CM | POA: Diagnosis not present

## 2020-01-18 DIAGNOSIS — Z1389 Encounter for screening for other disorder: Secondary | ICD-10-CM | POA: Diagnosis not present

## 2020-01-18 DIAGNOSIS — J301 Allergic rhinitis due to pollen: Secondary | ICD-10-CM | POA: Diagnosis not present

## 2020-01-18 DIAGNOSIS — Z1331 Encounter for screening for depression: Secondary | ICD-10-CM | POA: Diagnosis not present

## 2020-01-18 DIAGNOSIS — E785 Hyperlipidemia, unspecified: Secondary | ICD-10-CM | POA: Diagnosis not present

## 2020-01-18 DIAGNOSIS — F331 Major depressive disorder, recurrent, moderate: Secondary | ICD-10-CM | POA: Diagnosis not present

## 2020-02-12 DIAGNOSIS — Z23 Encounter for immunization: Secondary | ICD-10-CM | POA: Diagnosis not present

## 2020-02-29 DIAGNOSIS — Z23 Encounter for immunization: Secondary | ICD-10-CM | POA: Diagnosis not present

## 2020-04-09 DIAGNOSIS — I10 Essential (primary) hypertension: Secondary | ICD-10-CM | POA: Diagnosis not present

## 2020-04-09 DIAGNOSIS — F331 Major depressive disorder, recurrent, moderate: Secondary | ICD-10-CM | POA: Diagnosis not present

## 2020-04-09 DIAGNOSIS — E7849 Other hyperlipidemia: Secondary | ICD-10-CM | POA: Diagnosis not present

## 2020-04-18 DIAGNOSIS — Z20828 Contact with and (suspected) exposure to other viral communicable diseases: Secondary | ICD-10-CM | POA: Diagnosis not present

## 2020-05-29 ENCOUNTER — Other Ambulatory Visit: Payer: Self-pay | Admitting: Family Medicine

## 2020-05-29 DIAGNOSIS — Z1231 Encounter for screening mammogram for malignant neoplasm of breast: Secondary | ICD-10-CM

## 2020-07-10 ENCOUNTER — Ambulatory Visit: Payer: Medicare Other

## 2020-07-15 DIAGNOSIS — Z1329 Encounter for screening for other suspected endocrine disorder: Secondary | ICD-10-CM | POA: Diagnosis not present

## 2020-07-15 DIAGNOSIS — I10 Essential (primary) hypertension: Secondary | ICD-10-CM | POA: Diagnosis not present

## 2020-07-15 DIAGNOSIS — E785 Hyperlipidemia, unspecified: Secondary | ICD-10-CM | POA: Diagnosis not present

## 2020-07-19 DIAGNOSIS — E6609 Other obesity due to excess calories: Secondary | ICD-10-CM | POA: Diagnosis not present

## 2020-07-19 DIAGNOSIS — J301 Allergic rhinitis due to pollen: Secondary | ICD-10-CM | POA: Diagnosis not present

## 2020-07-19 DIAGNOSIS — E785 Hyperlipidemia, unspecified: Secondary | ICD-10-CM | POA: Diagnosis not present

## 2020-07-19 DIAGNOSIS — F331 Major depressive disorder, recurrent, moderate: Secondary | ICD-10-CM | POA: Diagnosis not present

## 2020-07-19 DIAGNOSIS — Z0001 Encounter for general adult medical examination with abnormal findings: Secondary | ICD-10-CM | POA: Diagnosis not present

## 2020-08-28 ENCOUNTER — Ambulatory Visit
Admission: RE | Admit: 2020-08-28 | Discharge: 2020-08-28 | Disposition: A | Discharge status: Home / Self Care | Payer: Medicare Other | Source: Ambulatory Visit | Attending: Family Medicine | Admitting: Family Medicine

## 2020-08-28 ENCOUNTER — Other Ambulatory Visit: Payer: Self-pay

## 2020-08-28 DIAGNOSIS — Z1231 Encounter for screening mammogram for malignant neoplasm of breast: Secondary | ICD-10-CM | POA: Diagnosis not present

## 2020-11-13 DIAGNOSIS — Z20822 Contact with and (suspected) exposure to covid-19: Secondary | ICD-10-CM | POA: Diagnosis not present

## 2021-01-17 DIAGNOSIS — E785 Hyperlipidemia, unspecified: Secondary | ICD-10-CM | POA: Diagnosis not present

## 2021-01-17 DIAGNOSIS — I10 Essential (primary) hypertension: Secondary | ICD-10-CM | POA: Diagnosis not present

## 2021-01-23 DIAGNOSIS — K2100 Gastro-esophageal reflux disease with esophagitis, without bleeding: Secondary | ICD-10-CM | POA: Diagnosis not present

## 2021-01-23 DIAGNOSIS — F331 Major depressive disorder, recurrent, moderate: Secondary | ICD-10-CM | POA: Diagnosis not present

## 2021-01-23 DIAGNOSIS — E6609 Other obesity due to excess calories: Secondary | ICD-10-CM | POA: Diagnosis not present

## 2021-01-23 DIAGNOSIS — J301 Allergic rhinitis due to pollen: Secondary | ICD-10-CM | POA: Diagnosis not present

## 2021-01-23 DIAGNOSIS — E785 Hyperlipidemia, unspecified: Secondary | ICD-10-CM | POA: Diagnosis not present

## 2021-01-23 DIAGNOSIS — Z23 Encounter for immunization: Secondary | ICD-10-CM | POA: Diagnosis not present

## 2021-01-30 DIAGNOSIS — Z96641 Presence of right artificial hip joint: Secondary | ICD-10-CM | POA: Diagnosis not present

## 2021-01-30 DIAGNOSIS — M25551 Pain in right hip: Secondary | ICD-10-CM | POA: Diagnosis not present

## 2021-01-31 DIAGNOSIS — Z96641 Presence of right artificial hip joint: Secondary | ICD-10-CM | POA: Diagnosis not present

## 2021-01-31 DIAGNOSIS — M25551 Pain in right hip: Secondary | ICD-10-CM | POA: Diagnosis not present

## 2021-02-03 DIAGNOSIS — Z96641 Presence of right artificial hip joint: Secondary | ICD-10-CM | POA: Diagnosis not present

## 2021-02-03 DIAGNOSIS — M25551 Pain in right hip: Secondary | ICD-10-CM | POA: Diagnosis not present

## 2021-02-04 DIAGNOSIS — M25551 Pain in right hip: Secondary | ICD-10-CM | POA: Diagnosis not present

## 2021-02-04 DIAGNOSIS — Z96641 Presence of right artificial hip joint: Secondary | ICD-10-CM | POA: Diagnosis not present

## 2021-02-06 DIAGNOSIS — M25551 Pain in right hip: Secondary | ICD-10-CM | POA: Diagnosis not present

## 2021-02-06 DIAGNOSIS — Z96641 Presence of right artificial hip joint: Secondary | ICD-10-CM | POA: Diagnosis not present

## 2021-02-10 DIAGNOSIS — Z96641 Presence of right artificial hip joint: Secondary | ICD-10-CM | POA: Diagnosis not present

## 2021-02-10 DIAGNOSIS — M25551 Pain in right hip: Secondary | ICD-10-CM | POA: Diagnosis not present

## 2021-02-11 DIAGNOSIS — Z96641 Presence of right artificial hip joint: Secondary | ICD-10-CM | POA: Diagnosis not present

## 2021-02-11 DIAGNOSIS — M25551 Pain in right hip: Secondary | ICD-10-CM | POA: Diagnosis not present

## 2021-02-13 DIAGNOSIS — Z96641 Presence of right artificial hip joint: Secondary | ICD-10-CM | POA: Diagnosis not present

## 2021-02-13 DIAGNOSIS — M25551 Pain in right hip: Secondary | ICD-10-CM | POA: Diagnosis not present

## 2021-02-18 DIAGNOSIS — Z96641 Presence of right artificial hip joint: Secondary | ICD-10-CM | POA: Diagnosis not present

## 2021-02-18 DIAGNOSIS — M25551 Pain in right hip: Secondary | ICD-10-CM | POA: Diagnosis not present

## 2021-02-20 DIAGNOSIS — M25551 Pain in right hip: Secondary | ICD-10-CM | POA: Diagnosis not present

## 2021-02-20 DIAGNOSIS — Z96641 Presence of right artificial hip joint: Secondary | ICD-10-CM | POA: Diagnosis not present

## 2021-02-21 DIAGNOSIS — Z96641 Presence of right artificial hip joint: Secondary | ICD-10-CM | POA: Diagnosis not present

## 2021-02-21 DIAGNOSIS — M25551 Pain in right hip: Secondary | ICD-10-CM | POA: Diagnosis not present

## 2021-02-24 DIAGNOSIS — Z96641 Presence of right artificial hip joint: Secondary | ICD-10-CM | POA: Diagnosis not present

## 2021-02-24 DIAGNOSIS — M25551 Pain in right hip: Secondary | ICD-10-CM | POA: Diagnosis not present

## 2021-02-27 DIAGNOSIS — M25551 Pain in right hip: Secondary | ICD-10-CM | POA: Diagnosis not present

## 2021-02-27 DIAGNOSIS — Z96641 Presence of right artificial hip joint: Secondary | ICD-10-CM | POA: Diagnosis not present

## 2021-02-28 DIAGNOSIS — Z96641 Presence of right artificial hip joint: Secondary | ICD-10-CM | POA: Diagnosis not present

## 2021-02-28 DIAGNOSIS — M25551 Pain in right hip: Secondary | ICD-10-CM | POA: Diagnosis not present

## 2021-03-02 IMAGING — CT CT OF THE RIGHT HIP WITHOUT CONTRAST
2 of 4 series · 16 of 46 positions shown, 19 images · non-contrast
Comparison: Pelvic x-rays from same day and September 19, 2018.

CLINICAL DATA: Felt a pop in the right hip while walking. Recent
right hip replacement 5 days ago.

EXAM:
CT OF THE RIGHT HIP WITHOUT CONTRAST
TECHNIQUE: Multidetector CT imaging of the right hip was performed according to
the standard protocol. Multiplanar CT image reconstructions were
also generated.

[Series 4: axial st · axial · 0.42mm/px · z∈[+961,+1153]mm · 13 of 143 slices shown, 16 images]
[im 10/143  soft-tissue]
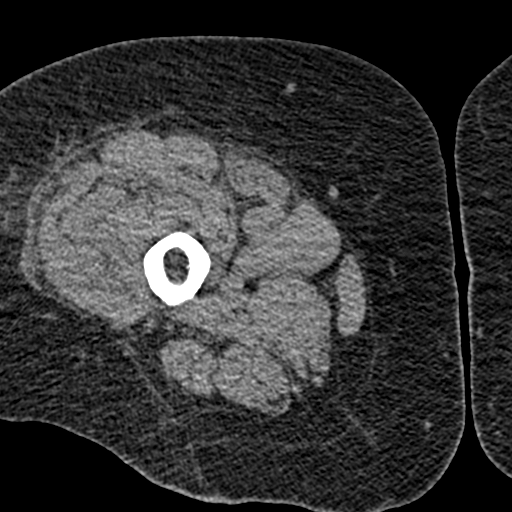
[im 10/143  bone]
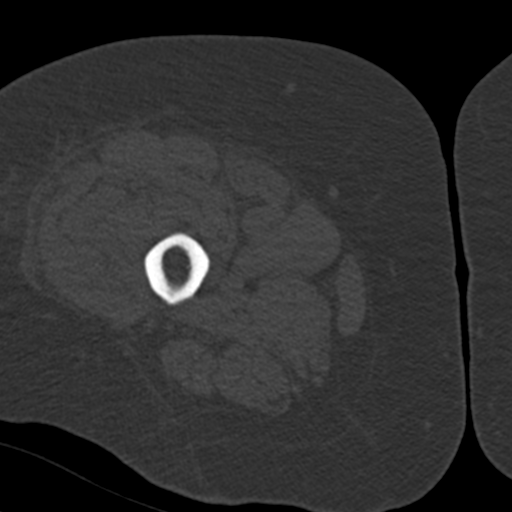
[im 23/143  soft-tissue]
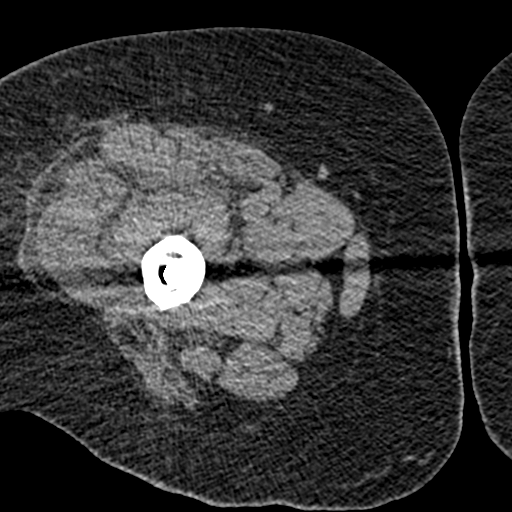
[im 37/143  soft-tissue]
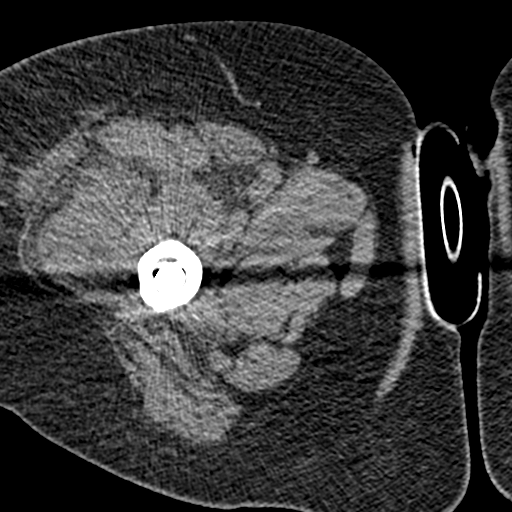
[im 51/143  soft-tissue]
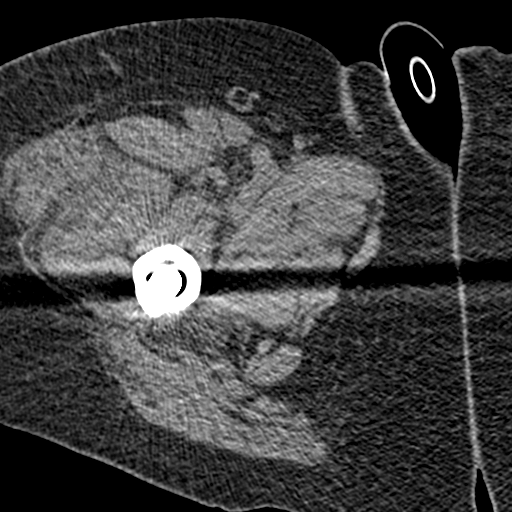
[im 65/143  soft-tissue]
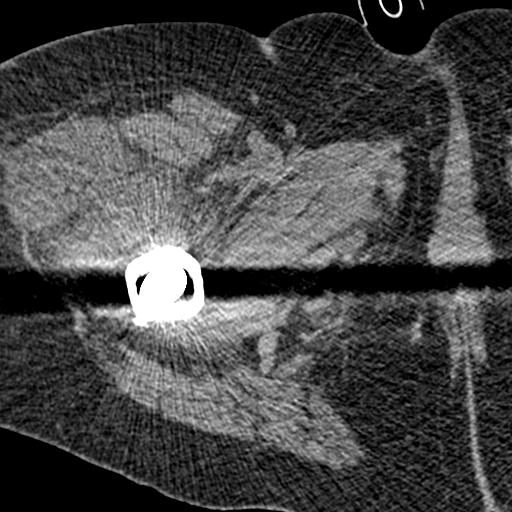
[im 78/143  soft-tissue]
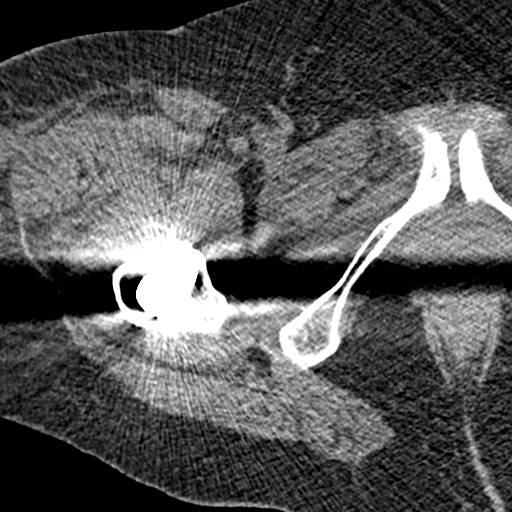
[im 92/143  soft-tissue]
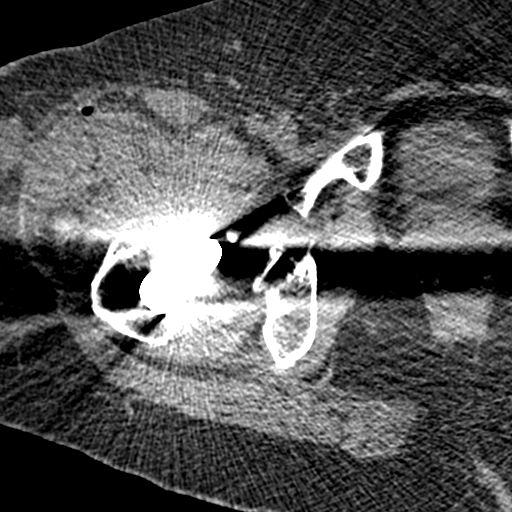
[im 106/143  soft-tissue]
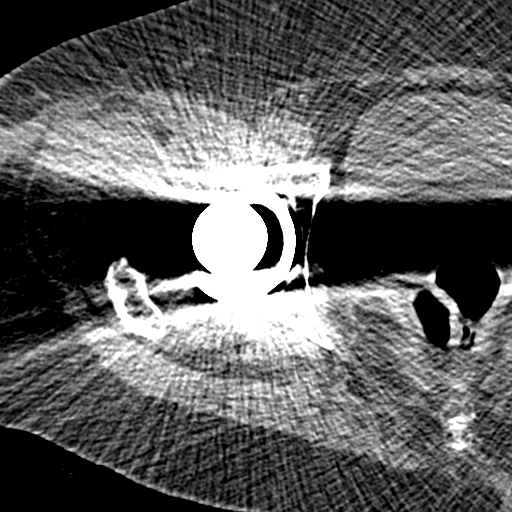
[im 120/143  soft-tissue]
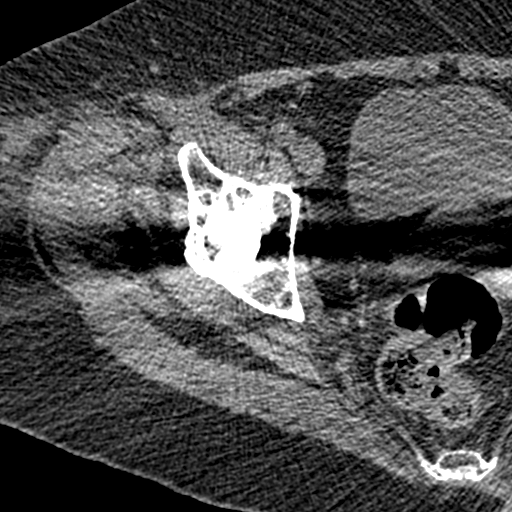
[im 120/143  bone]
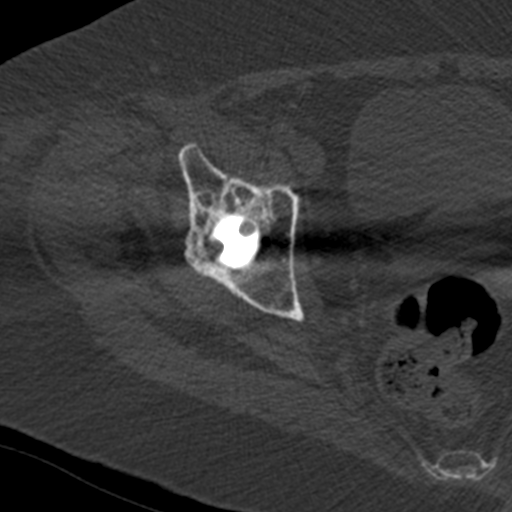
[im 124/143  lung]
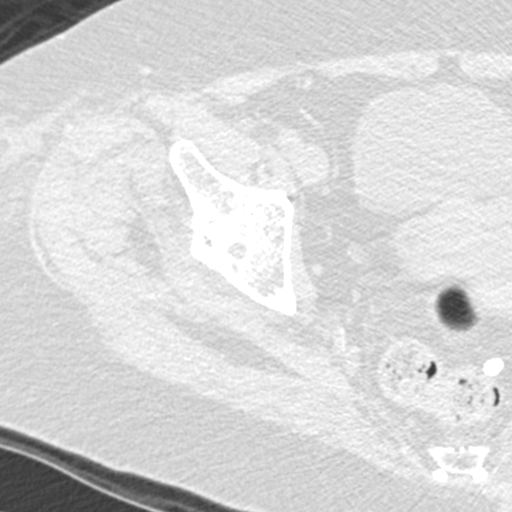
[im 129/143  lung]
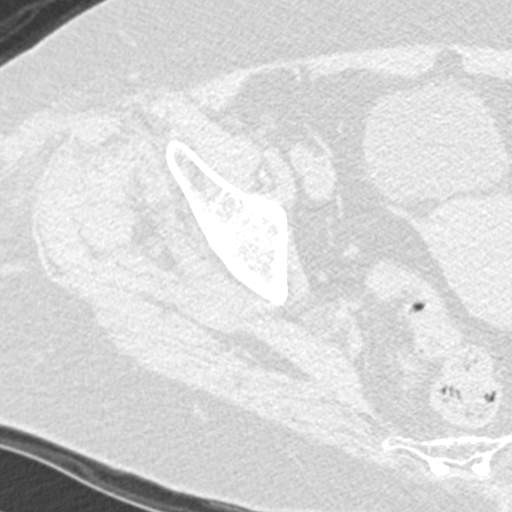
[im 133/143  soft-tissue]
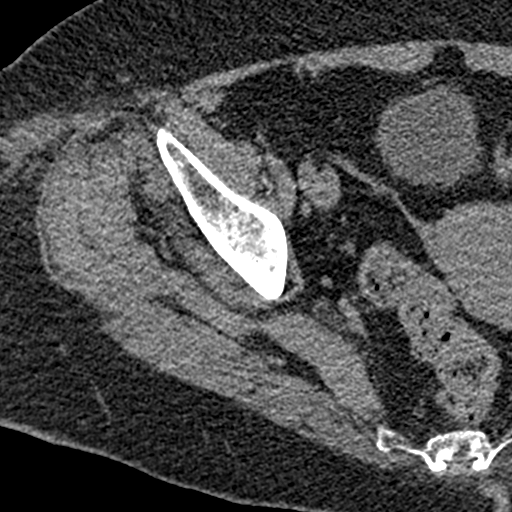
[im 133/143  lung]
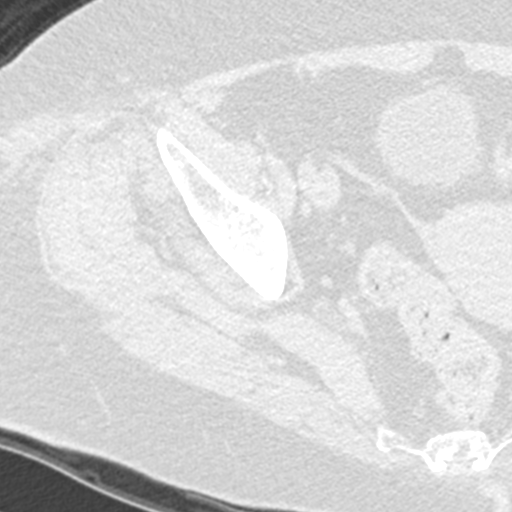
[im 138/143  lung]
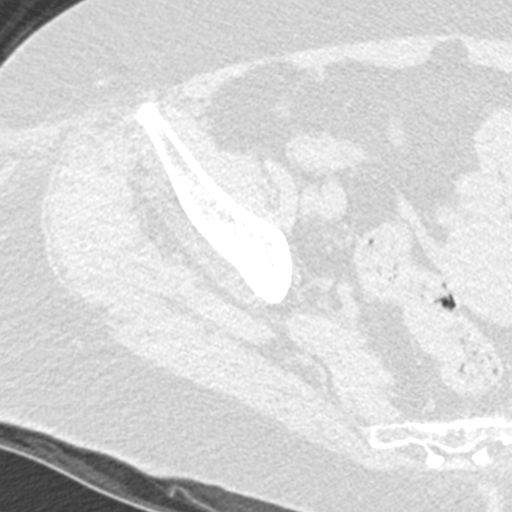

[Series 9: coronal st · coronal · 0.44mm/px · 3 of 151 slices shown]
[im 51/151  soft-tissue]
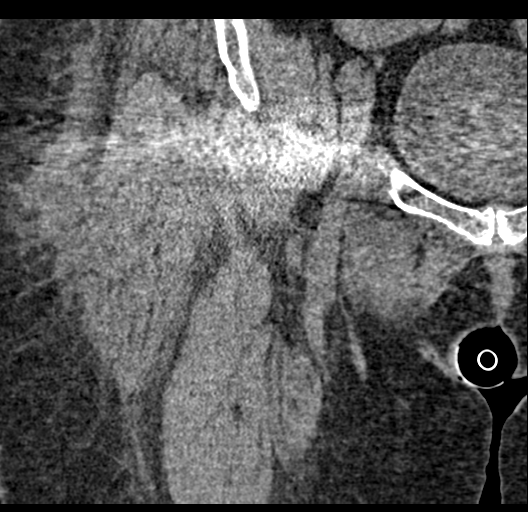
[im 67/151  soft-tissue]
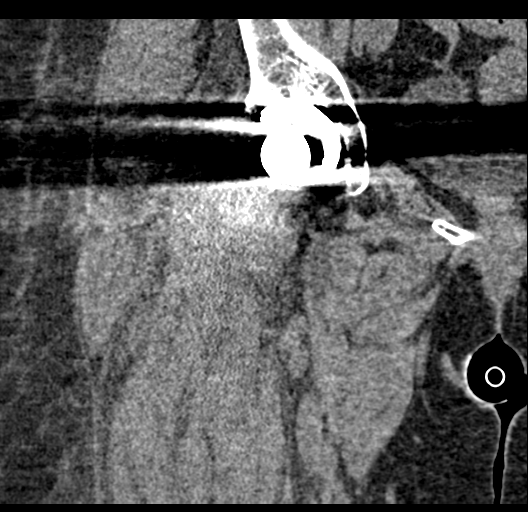
[im 84/151  soft-tissue]
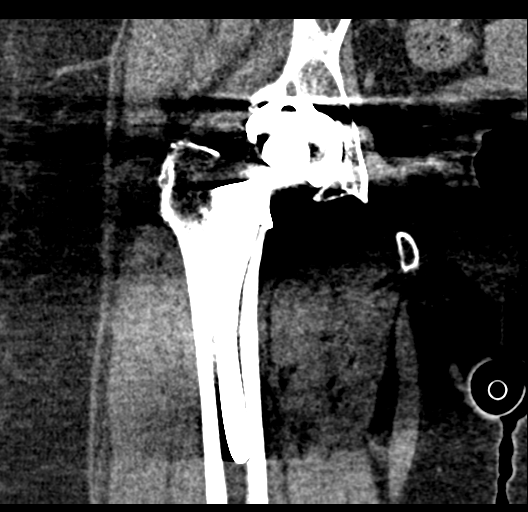

[16 of 46 positions shown; findings below may reference images not displayed]

FINDINGS: Bones/Joint/Cartilage

Prior right total hip arthroplasty. There is an acute nondisplaced
periprosthetic fracture of the greater trochanter. No dislocation.
No large joint effusion. Chronic subchondral cystic change in the
acetabulum.

Ligaments

Suboptimally assessed by CT.

Muscles and Tendons

Grossly intact.

Soft tissues

Small amount of soft tissue edema with a few foci of subcutaneous
emphysema in the anterior right hip, likely postsurgical. No fluid
collection or soft tissue mass.
IMPRESSION: 1. Prior right total hip arthroplasty with acute nondisplaced
periprosthetic fracture of the greater trochanter.

## 2021-03-04 DIAGNOSIS — M25551 Pain in right hip: Secondary | ICD-10-CM | POA: Diagnosis not present

## 2021-03-04 DIAGNOSIS — Z96641 Presence of right artificial hip joint: Secondary | ICD-10-CM | POA: Diagnosis not present

## 2021-03-06 DIAGNOSIS — Z96641 Presence of right artificial hip joint: Secondary | ICD-10-CM | POA: Diagnosis not present

## 2021-03-06 DIAGNOSIS — M25551 Pain in right hip: Secondary | ICD-10-CM | POA: Diagnosis not present

## 2021-03-11 DIAGNOSIS — M25551 Pain in right hip: Secondary | ICD-10-CM | POA: Diagnosis not present

## 2021-03-11 DIAGNOSIS — Z96641 Presence of right artificial hip joint: Secondary | ICD-10-CM | POA: Diagnosis not present

## 2021-03-13 DIAGNOSIS — Z96641 Presence of right artificial hip joint: Secondary | ICD-10-CM | POA: Diagnosis not present

## 2021-03-13 DIAGNOSIS — M25551 Pain in right hip: Secondary | ICD-10-CM | POA: Diagnosis not present

## 2021-03-18 DIAGNOSIS — M25551 Pain in right hip: Secondary | ICD-10-CM | POA: Diagnosis not present

## 2021-03-18 DIAGNOSIS — Z96641 Presence of right artificial hip joint: Secondary | ICD-10-CM | POA: Diagnosis not present

## 2021-03-20 DIAGNOSIS — M25551 Pain in right hip: Secondary | ICD-10-CM | POA: Diagnosis not present

## 2021-03-20 DIAGNOSIS — Z96641 Presence of right artificial hip joint: Secondary | ICD-10-CM | POA: Diagnosis not present

## 2021-03-27 DIAGNOSIS — Z20828 Contact with and (suspected) exposure to other viral communicable diseases: Secondary | ICD-10-CM | POA: Diagnosis not present

## 2021-04-18 DIAGNOSIS — H40033 Anatomical narrow angle, bilateral: Secondary | ICD-10-CM | POA: Diagnosis not present

## 2021-04-18 DIAGNOSIS — H04123 Dry eye syndrome of bilateral lacrimal glands: Secondary | ICD-10-CM | POA: Diagnosis not present

## 2021-07-07 DIAGNOSIS — Z20822 Contact with and (suspected) exposure to covid-19: Secondary | ICD-10-CM | POA: Diagnosis not present

## 2021-07-14 DIAGNOSIS — R5382 Chronic fatigue, unspecified: Secondary | ICD-10-CM | POA: Diagnosis not present

## 2021-07-14 DIAGNOSIS — Z1329 Encounter for screening for other suspected endocrine disorder: Secondary | ICD-10-CM | POA: Diagnosis not present

## 2021-07-14 DIAGNOSIS — I10 Essential (primary) hypertension: Secondary | ICD-10-CM | POA: Diagnosis not present

## 2021-07-14 DIAGNOSIS — K2100 Gastro-esophageal reflux disease with esophagitis, without bleeding: Secondary | ICD-10-CM | POA: Diagnosis not present

## 2021-07-18 ENCOUNTER — Other Ambulatory Visit: Payer: Self-pay | Admitting: Family Medicine

## 2021-07-18 DIAGNOSIS — Z1231 Encounter for screening mammogram for malignant neoplasm of breast: Secondary | ICD-10-CM

## 2021-07-22 DIAGNOSIS — Z20822 Contact with and (suspected) exposure to covid-19: Secondary | ICD-10-CM | POA: Diagnosis not present

## 2021-07-24 DIAGNOSIS — E6609 Other obesity due to excess calories: Secondary | ICD-10-CM | POA: Diagnosis not present

## 2021-07-24 DIAGNOSIS — J301 Allergic rhinitis due to pollen: Secondary | ICD-10-CM | POA: Diagnosis not present

## 2021-07-24 DIAGNOSIS — Z1389 Encounter for screening for other disorder: Secondary | ICD-10-CM | POA: Diagnosis not present

## 2021-07-24 DIAGNOSIS — F331 Major depressive disorder, recurrent, moderate: Secondary | ICD-10-CM | POA: Diagnosis not present

## 2021-07-24 DIAGNOSIS — Z1331 Encounter for screening for depression: Secondary | ICD-10-CM | POA: Diagnosis not present

## 2021-07-24 DIAGNOSIS — E785 Hyperlipidemia, unspecified: Secondary | ICD-10-CM | POA: Diagnosis not present

## 2021-07-24 DIAGNOSIS — K2100 Gastro-esophageal reflux disease with esophagitis, without bleeding: Secondary | ICD-10-CM | POA: Diagnosis not present

## 2021-07-31 DIAGNOSIS — R509 Fever, unspecified: Secondary | ICD-10-CM | POA: Diagnosis not present

## 2021-07-31 DIAGNOSIS — Z20828 Contact with and (suspected) exposure to other viral communicable diseases: Secondary | ICD-10-CM | POA: Diagnosis not present

## 2021-07-31 DIAGNOSIS — J029 Acute pharyngitis, unspecified: Secondary | ICD-10-CM | POA: Diagnosis not present

## 2021-08-12 DIAGNOSIS — Z20822 Contact with and (suspected) exposure to covid-19: Secondary | ICD-10-CM | POA: Diagnosis not present

## 2021-08-15 DIAGNOSIS — Z20822 Contact with and (suspected) exposure to covid-19: Secondary | ICD-10-CM | POA: Diagnosis not present

## 2021-08-25 DIAGNOSIS — Z20822 Contact with and (suspected) exposure to covid-19: Secondary | ICD-10-CM | POA: Diagnosis not present

## 2021-08-29 ENCOUNTER — Ambulatory Visit
Admission: RE | Admit: 2021-08-29 | Discharge: 2021-08-29 | Disposition: A | Discharge status: Home / Self Care | Payer: Medicare Other | Source: Ambulatory Visit | Attending: Family Medicine | Admitting: Family Medicine

## 2021-08-29 DIAGNOSIS — Z1231 Encounter for screening mammogram for malignant neoplasm of breast: Secondary | ICD-10-CM

## 2021-09-15 DIAGNOSIS — Z20822 Contact with and (suspected) exposure to covid-19: Secondary | ICD-10-CM | POA: Diagnosis not present

## 2022-01-16 DIAGNOSIS — I10 Essential (primary) hypertension: Secondary | ICD-10-CM | POA: Diagnosis not present

## 2022-01-16 DIAGNOSIS — K2100 Gastro-esophageal reflux disease with esophagitis, without bleeding: Secondary | ICD-10-CM | POA: Diagnosis not present

## 2022-01-16 DIAGNOSIS — E785 Hyperlipidemia, unspecified: Secondary | ICD-10-CM | POA: Diagnosis not present

## 2022-01-16 DIAGNOSIS — E039 Hypothyroidism, unspecified: Secondary | ICD-10-CM | POA: Diagnosis not present

## 2022-01-20 DIAGNOSIS — Z23 Encounter for immunization: Secondary | ICD-10-CM | POA: Diagnosis not present

## 2022-01-20 DIAGNOSIS — Z0000 Encounter for general adult medical examination without abnormal findings: Secondary | ICD-10-CM | POA: Diagnosis not present

## 2022-01-20 DIAGNOSIS — F331 Major depressive disorder, recurrent, moderate: Secondary | ICD-10-CM | POA: Diagnosis not present

## 2022-01-20 DIAGNOSIS — E6609 Other obesity due to excess calories: Secondary | ICD-10-CM | POA: Diagnosis not present

## 2022-01-20 DIAGNOSIS — J301 Allergic rhinitis due to pollen: Secondary | ICD-10-CM | POA: Diagnosis not present

## 2022-01-20 DIAGNOSIS — E785 Hyperlipidemia, unspecified: Secondary | ICD-10-CM | POA: Diagnosis not present

## 2022-01-20 DIAGNOSIS — K2100 Gastro-esophageal reflux disease with esophagitis, without bleeding: Secondary | ICD-10-CM | POA: Diagnosis not present

## 2022-04-08 DIAGNOSIS — J329 Chronic sinusitis, unspecified: Secondary | ICD-10-CM | POA: Diagnosis not present

## 2022-04-08 DIAGNOSIS — J029 Acute pharyngitis, unspecified: Secondary | ICD-10-CM | POA: Diagnosis not present

## 2022-04-08 DIAGNOSIS — R030 Elevated blood-pressure reading, without diagnosis of hypertension: Secondary | ICD-10-CM | POA: Diagnosis not present

## 2022-04-08 DIAGNOSIS — R059 Cough, unspecified: Secondary | ICD-10-CM | POA: Diagnosis not present

## 2022-04-08 DIAGNOSIS — Z20828 Contact with and (suspected) exposure to other viral communicable diseases: Secondary | ICD-10-CM | POA: Diagnosis not present

## 2022-06-30 DIAGNOSIS — R300 Dysuria: Secondary | ICD-10-CM | POA: Diagnosis not present

## 2022-07-14 DIAGNOSIS — R7303 Prediabetes: Secondary | ICD-10-CM | POA: Diagnosis not present

## 2022-07-14 DIAGNOSIS — D529 Folate deficiency anemia, unspecified: Secondary | ICD-10-CM | POA: Diagnosis not present

## 2022-07-14 DIAGNOSIS — E785 Hyperlipidemia, unspecified: Secondary | ICD-10-CM | POA: Diagnosis not present

## 2022-07-14 DIAGNOSIS — E039 Hypothyroidism, unspecified: Secondary | ICD-10-CM | POA: Diagnosis not present

## 2022-07-14 DIAGNOSIS — I10 Essential (primary) hypertension: Secondary | ICD-10-CM | POA: Diagnosis not present

## 2022-07-21 DIAGNOSIS — F331 Major depressive disorder, recurrent, moderate: Secondary | ICD-10-CM | POA: Diagnosis not present

## 2022-07-21 DIAGNOSIS — R030 Elevated blood-pressure reading, without diagnosis of hypertension: Secondary | ICD-10-CM | POA: Diagnosis not present

## 2022-07-21 DIAGNOSIS — E6609 Other obesity due to excess calories: Secondary | ICD-10-CM | POA: Diagnosis not present

## 2022-07-21 DIAGNOSIS — E785 Hyperlipidemia, unspecified: Secondary | ICD-10-CM | POA: Diagnosis not present

## 2022-07-21 DIAGNOSIS — J301 Allergic rhinitis due to pollen: Secondary | ICD-10-CM | POA: Diagnosis not present

## 2022-07-21 DIAGNOSIS — K2100 Gastro-esophageal reflux disease with esophagitis, without bleeding: Secondary | ICD-10-CM | POA: Diagnosis not present

## 2022-07-24 ENCOUNTER — Other Ambulatory Visit: Payer: Self-pay | Admitting: Family Medicine

## 2022-07-24 DIAGNOSIS — Z1231 Encounter for screening mammogram for malignant neoplasm of breast: Secondary | ICD-10-CM

## 2022-09-09 ENCOUNTER — Ambulatory Visit
Admission: RE | Admit: 2022-09-09 | Discharge: 2022-09-09 | Disposition: A | Discharge status: Home / Self Care | Payer: Medicare Other | Source: Ambulatory Visit | Attending: Family Medicine | Admitting: Family Medicine

## 2022-09-09 DIAGNOSIS — Z1231 Encounter for screening mammogram for malignant neoplasm of breast: Secondary | ICD-10-CM | POA: Diagnosis not present

## 2023-01-25 DIAGNOSIS — Z0001 Encounter for general adult medical examination with abnormal findings: Secondary | ICD-10-CM | POA: Diagnosis not present

## 2023-01-25 DIAGNOSIS — E785 Hyperlipidemia, unspecified: Secondary | ICD-10-CM | POA: Diagnosis not present

## 2023-01-25 DIAGNOSIS — E559 Vitamin D deficiency, unspecified: Secondary | ICD-10-CM | POA: Diagnosis not present

## 2023-01-25 DIAGNOSIS — E039 Hypothyroidism, unspecified: Secondary | ICD-10-CM | POA: Diagnosis not present

## 2023-01-25 DIAGNOSIS — Z1329 Encounter for screening for other suspected endocrine disorder: Secondary | ICD-10-CM | POA: Diagnosis not present

## 2023-01-28 DIAGNOSIS — Z0001 Encounter for general adult medical examination with abnormal findings: Secondary | ICD-10-CM | POA: Diagnosis not present

## 2023-01-28 DIAGNOSIS — Z23 Encounter for immunization: Secondary | ICD-10-CM | POA: Diagnosis not present

## 2023-01-28 DIAGNOSIS — J301 Allergic rhinitis due to pollen: Secondary | ICD-10-CM | POA: Diagnosis not present

## 2023-01-28 DIAGNOSIS — E6609 Other obesity due to excess calories: Secondary | ICD-10-CM | POA: Diagnosis not present

## 2023-01-28 DIAGNOSIS — F331 Major depressive disorder, recurrent, moderate: Secondary | ICD-10-CM | POA: Diagnosis not present

## 2023-01-28 DIAGNOSIS — E785 Hyperlipidemia, unspecified: Secondary | ICD-10-CM | POA: Diagnosis not present

## 2023-01-28 DIAGNOSIS — R030 Elevated blood-pressure reading, without diagnosis of hypertension: Secondary | ICD-10-CM | POA: Diagnosis not present

## 2023-01-28 DIAGNOSIS — K2100 Gastro-esophageal reflux disease with esophagitis, without bleeding: Secondary | ICD-10-CM | POA: Diagnosis not present

## 2023-03-26 DIAGNOSIS — D485 Neoplasm of uncertain behavior of skin: Secondary | ICD-10-CM | POA: Diagnosis not present

## 2023-07-19 DIAGNOSIS — J0100 Acute maxillary sinusitis, unspecified: Secondary | ICD-10-CM | POA: Diagnosis not present

## 2023-07-21 DIAGNOSIS — E785 Hyperlipidemia, unspecified: Secondary | ICD-10-CM | POA: Diagnosis not present

## 2023-07-21 DIAGNOSIS — K2100 Gastro-esophageal reflux disease with esophagitis, without bleeding: Secondary | ICD-10-CM | POA: Diagnosis not present

## 2023-07-21 DIAGNOSIS — R5382 Chronic fatigue, unspecified: Secondary | ICD-10-CM | POA: Diagnosis not present

## 2023-07-21 DIAGNOSIS — I10 Essential (primary) hypertension: Secondary | ICD-10-CM | POA: Diagnosis not present

## 2023-07-27 DIAGNOSIS — F331 Major depressive disorder, recurrent, moderate: Secondary | ICD-10-CM | POA: Diagnosis not present

## 2023-07-27 DIAGNOSIS — E785 Hyperlipidemia, unspecified: Secondary | ICD-10-CM | POA: Diagnosis not present

## 2023-07-27 DIAGNOSIS — I10 Essential (primary) hypertension: Secondary | ICD-10-CM | POA: Diagnosis not present

## 2023-07-27 DIAGNOSIS — K2100 Gastro-esophageal reflux disease with esophagitis, without bleeding: Secondary | ICD-10-CM | POA: Diagnosis not present

## 2023-08-16 DIAGNOSIS — J301 Allergic rhinitis due to pollen: Secondary | ICD-10-CM | POA: Diagnosis not present

## 2024-01-19 DIAGNOSIS — I10 Essential (primary) hypertension: Secondary | ICD-10-CM | POA: Diagnosis not present

## 2024-01-19 DIAGNOSIS — E785 Hyperlipidemia, unspecified: Secondary | ICD-10-CM | POA: Diagnosis not present

## 2024-01-19 DIAGNOSIS — Z1329 Encounter for screening for other suspected endocrine disorder: Secondary | ICD-10-CM | POA: Diagnosis not present

## 2024-01-19 DIAGNOSIS — D529 Folate deficiency anemia, unspecified: Secondary | ICD-10-CM | POA: Diagnosis not present

## 2024-01-19 DIAGNOSIS — D649 Anemia, unspecified: Secondary | ICD-10-CM | POA: Diagnosis not present

## 2024-01-19 DIAGNOSIS — R5382 Chronic fatigue, unspecified: Secondary | ICD-10-CM | POA: Diagnosis not present

## 2024-01-19 DIAGNOSIS — Z131 Encounter for screening for diabetes mellitus: Secondary | ICD-10-CM | POA: Diagnosis not present

## 2024-01-26 DIAGNOSIS — Z6833 Body mass index (BMI) 33.0-33.9, adult: Secondary | ICD-10-CM | POA: Diagnosis not present

## 2024-01-26 DIAGNOSIS — Z23 Encounter for immunization: Secondary | ICD-10-CM | POA: Diagnosis not present

## 2024-01-26 DIAGNOSIS — Z1331 Encounter for screening for depression: Secondary | ICD-10-CM | POA: Diagnosis not present

## 2024-01-26 DIAGNOSIS — F331 Major depressive disorder, recurrent, moderate: Secondary | ICD-10-CM | POA: Diagnosis not present

## 2024-01-26 DIAGNOSIS — J301 Allergic rhinitis due to pollen: Secondary | ICD-10-CM | POA: Diagnosis not present

## 2024-01-26 DIAGNOSIS — Z0001 Encounter for general adult medical examination with abnormal findings: Secondary | ICD-10-CM | POA: Diagnosis not present

## 2024-01-26 DIAGNOSIS — K2100 Gastro-esophageal reflux disease with esophagitis, without bleeding: Secondary | ICD-10-CM | POA: Diagnosis not present

## 2024-01-26 DIAGNOSIS — Z1389 Encounter for screening for other disorder: Secondary | ICD-10-CM | POA: Diagnosis not present

## 2024-01-26 DIAGNOSIS — I10 Essential (primary) hypertension: Secondary | ICD-10-CM | POA: Diagnosis not present

## 2024-05-23 ENCOUNTER — Other Ambulatory Visit (HOSPITAL_BASED_OUTPATIENT_CLINIC_OR_DEPARTMENT_OTHER): Payer: Self-pay

## 2024-05-23 MED ORDER — HYDROCHLOROTHIAZIDE 25 MG PO TABS
12.5000 mg | ORAL_TABLET | Freq: Every day | ORAL | 1 refills | Status: AC
  Filled 2024-05-23: qty 45, 90d supply, fill #0

## 2024-05-23 MED ORDER — SERTRALINE HCL 100 MG PO TABS
200.0000 mg | ORAL_TABLET | Freq: Every day | ORAL | 1 refills | Status: AC
  Filled 2024-05-23: qty 180, 90d supply, fill #0

## 2024-05-23 MED ORDER — ROSUVASTATIN CALCIUM 5 MG PO TABS
5.0000 mg | ORAL_TABLET | Freq: Every evening | ORAL | 1 refills | Status: AC
  Filled 2024-05-23: qty 90, 90d supply, fill #0

## 2024-06-02 ENCOUNTER — Other Ambulatory Visit (HOSPITAL_BASED_OUTPATIENT_CLINIC_OR_DEPARTMENT_OTHER): Payer: Self-pay

## 2024-06-04 ENCOUNTER — Other Ambulatory Visit (HOSPITAL_BASED_OUTPATIENT_CLINIC_OR_DEPARTMENT_OTHER): Payer: Self-pay

## 2024-06-05 ENCOUNTER — Other Ambulatory Visit (HOSPITAL_BASED_OUTPATIENT_CLINIC_OR_DEPARTMENT_OTHER): Payer: Self-pay

## 2024-06-05 MED ORDER — PANTOPRAZOLE SODIUM 40 MG PO TBEC
40.0000 mg | DELAYED_RELEASE_TABLET | Freq: Every day | ORAL | 1 refills | Status: AC
  Filled 2024-06-05: qty 90, 90d supply, fill #0

## 2024-06-06 ENCOUNTER — Other Ambulatory Visit (HOSPITAL_BASED_OUTPATIENT_CLINIC_OR_DEPARTMENT_OTHER): Payer: Self-pay

## 2024-06-07 ENCOUNTER — Ambulatory Visit

## 2024-06-13 ENCOUNTER — Encounter: Payer: Self-pay | Admitting: Physical Therapy

## 2024-06-13 ENCOUNTER — Ambulatory Visit: Admitting: Physical Therapy

## 2024-06-13 DIAGNOSIS — R2689 Other abnormalities of gait and mobility: Secondary | ICD-10-CM

## 2024-06-13 DIAGNOSIS — M6281 Muscle weakness (generalized): Secondary | ICD-10-CM

## 2024-06-15 ENCOUNTER — Ambulatory Visit: Admitting: Physical Therapy

## 2024-06-15 ENCOUNTER — Encounter: Payer: Self-pay | Admitting: Physical Therapy

## 2024-06-15 DIAGNOSIS — R2689 Other abnormalities of gait and mobility: Secondary | ICD-10-CM

## 2024-06-15 DIAGNOSIS — M6281 Muscle weakness (generalized): Secondary | ICD-10-CM

## 2024-06-15 NOTE — Therapy (Signed)
 " OUTPATIENT PHYSICAL THERAPY TREATMENT   Patient Name: Christina Zhang MRN: [id number] DOB:08-01-1945, 79 y.o., female Today's Date: 06/15/2024  END OF SESSION:  PT End of Session - 06/15/24 1613     Visit Number 2    Number of Visits 15    Date for Recertification  09/05/24    Progress Note Due on Visit 10    PT Start Time 1600    PT Stop Time 1640    PT Time Calculation (min) 40 min    Activity Tolerance Patient tolerated treatment well    Behavior During Therapy WFL for tasks assessed/performed          Past Medical History:  Diagnosis Date   Depression    Diverticulosis    Hypertension    OA (osteoarthritis)    left hip   PONV (postoperative nausea and vomiting)    after ortoscopic knee surgery yrs ago   Wears glasses    Past Surgical History:  Procedure Laterality Date   CARPAL TUNNEL RELEASE Right early 2000s   DILATION AND CURETTAGE OF UTERUS  1977   KNEE ARTHROSCOPY Right 2007 approx.   TOTAL HIP ARTHROPLASTY Left 06/30/2017   Procedure: TOTAL LEFT HIP ARTHROPLASTY ANTERIOR APPROACH;  Surgeon: Melodi Lerner, MD;  Location: [facility];  Service: Orthopedics;  Laterality: Left;   TOTAL HIP ARTHROPLASTY Right 09/19/2018   Procedure: TOTAL HIP ARTHROPLASTY ANTERIOR APPROACH;  Surgeon: Melodi Lerner, MD;  Location: [facility];  Service: Orthopedics;  Laterality: Right;   TOTAL KNEE ARTHROPLASTY  12/09/2011   Procedure: TOTAL KNEE ARTHROPLASTY;  Surgeon: Lerner LULLA Melodi, MD;  Location: [facility];  Service: Orthopedics;  Laterality: Left;   TOTAL KNEE ARTHROPLASTY Right 07/22/2015   Procedure: RIGHT TOTAL KNEE ARTHROPLASTY;  Surgeon: Lerner Melodi, MD;  Location: [facility];  Service: Orthopedics;  Laterality: Right;   TUBAL LIGATION Bilateral 1977   Patient Active Problem List   Diagnosis Date Noted   Vitamin B12 deficiency 09/26/2018   Depression 09/25/2018   Anemia 09/25/2018   Periprosthetic fracture around internal prosthetic right hip joint (HCC)    Hip fracture requiring  operative repair, right, closed, with delayed healing, subsequent encounter 09/24/2018   Essential hypertension 09/24/2018   OA (osteoarthritis) of hip 06/30/2017   Postop Hypokalemia 12/11/2011   OA (osteoarthritis) of knee 12/09/2011    PCP: Toribio Jerel KANDICE, MD   REFERRING PROVIDER: Toribio Jerel KANDICE, MD   REFERRING DIAG:  Diagnosis  R29.6 (ICD-10-CM) - Repeated falls    Rationale for Evaluation and Treatment:  Rehabiliation  THERAPY DIAG:  Other abnormalities of gait and mobility  Muscle weakness (generalized)  ONSET DATE: Chronic condition since joint replacements 2013   SUBJECTIVE:  SUBJECTIVE STATEMENT: She denies pain today but feels weak.  Per Eval: She relays had both hips replaced and both knees replaced and she relays her right hip broke about 2 years ago and she was laid up for 3 weeks at this time. She feels she has become inactive and her balance is bad. She denies falls but has had many close calls where she almost fell. She does not use assistive device.   NEXT MD VISIT: September 2026  PERTINENT HISTORY:  See above PMH, bilat TKA's and THA's.   PAIN:  NPRS scale: denies today upon arrival Pain location:all over Pain description: intermittent Aggravating factors: activity Relieving factors: rest   PRECAUTIONS: ,  None  RED FLAGS: None   WEIGHT BEARING RESTRICTIONS:  No  FALLS:  Has patient fallen in last 6 months? No falls, but close calls  PLOF:  Independent with basic ADLs and Needs assistance with homemaking  PATIENT GOALS:  Walk the track, improve balance, navigate stairs better  OBJECTIVE:  Note: Objective measures were completed at Evaluation unless otherwise noted.  DIAGNOSTIC FINDINGS:  Nothing recent  PATIENT SURVEYS:  Patient-Specific  Activity Scoring Scheme  0 represents unable to perform. 10 represents able to perform at prior level. 0 1 2 3 4 5 6 7 8 9  10 (Date and Score)   Activity Eval     1. One flight of stairs 4    2. Walking 1/4 mile 5    3.     4.    5.    Score 4.5    Total score = sum of the activity scores/number of activities Minimum detectable change (90%CI) for average score = 2 points Minimum detectable change (90%CI) for single activity score = 3 points     EDEMA:  No  GAIT: Assistive device utilized: None Level of assistance: Complete Independence Comments: gait unsteadiness with turning, decreased hip/knee flexion on R LE with foot/hip in more External rotation, increased lateral hip sway/trendelenburg bilat    LOWER EXTREMITY MMT:    MMT in sitting Right eval Left eval  Hip flexion 4 4-  Hip extension 4 4  Hip abduction 4 4-  Hip adduction 5 5  Hip internal rotation    Hip external rotation    Knee flexion 4+ 4+  Knee extension 4 4-  Ankle dorsiflexion 4 4  Ankle plantarflexion 4 4  Ankle inversion    Ankle eversion     (Blank rows = not tested)  UPPER EXTREMITY TEST: Grossly 4+/5 MMT    FUNCTIONAL TESTS:  06/13/24 Timed up and go (TUG): 15.13 seconds without UE support                                                                                                                                 TREATMENT DATE:  06/15/24 Nu step L4 X 8 min UE/LE Standing hip flexion stretch at wall 15 sec X 3 UBE 5  min total, 2.5 min push, 2.5 min pull Standing mod tandem stance weight shifting A-P, working on heel-toe rocking Tandem balance 30 sec X 2 bilat Standing hip extensions X 10 reps bilat alternating Standing hip abd X 10 reps bilat alternating Supine bent knee fall outs X 15 Supine hip adduction ball squeeze 5 sec X 10 Supine bridges X 10 Supine marches X 10 bilat  PATIENT EDUCATION: Education details: HEP, PT plan of care, selfcare (educated on  importance of exercises, remaining consistent with them, walking program, not to push into pain with exercises or walking Person educated: Patient Education method: Explanation, Demonstration, Verbal cues, and Handouts Education comprehension: verbalized understanding, further education recommended   HOME EXERCISE PROGRAM: Access Code: [email or web address] URL: [email or web address] Date: 06/13/2024 Prepared by: Redell Moose  Exercises - standing hip flexor stretch with arms up, using wall  - 2 x daily - 6 x weekly - 1 sets - 3 reps - 15 sec hold - Staggered Stance Forward Backward Weight Shift with Unilateral Counter Support  - 2 x daily - 6 x weekly - 1 sets - 15 reps - Standing Tandem Balance with Counter Support  - 2 x daily - 6 x weekly - 1 sets - 3 reps - 30 sec hold - Standing Hip Abduction with Resistance at Ankles and Counter Support  - 2 x daily - 6 x weekly - 2 sets - 10 reps - Standing Hip Extension with Resistance at Ankles and Counter Support  - 2 x daily - 6 x weekly - 2 sets - 10 reps  ASSESSMENT:  CLINICAL IMPRESSION: She had difficulty with band exercises so we will remove the resistance for now until she gains more strength. Other than that tolerated session well.    Per eval Patient referred to PT for balance training, gait and deconditioning. Patient will benefit from skilled PT to improve overall function and to address impairments and limitations listed below.  OBJECTIVE IMPAIRMENTS: decreased activity tolerance for ADL's, difficulty walking, decreased balance, decreased endurance, decreased mobility, decreased ROM, decreased strength, impaired flexibility, impaired UE/LE use, and pain.  ACTIVITY LIMITATIONS: bending, liftting, walking, standing, cleaning, community activity, reaching, carry,  PERSONAL FACTORS: see above PMH  also affecting patient's functional outcome.  REHAB POTENTIAL: Good  CLINICAL DECISION MAKING: Evolving/moderate  complexity  EVALUATION COMPLEXITY: Moderate    GOALS: Short term PT Goals Target date: 07/11/2024   Pt will be I and compliant with HEP. Baseline:  Goal status: New Pt will improve BERG balance test to >42 points Baseline: Goal status: New  Long term PT goals Target date:09/05/2024   Pt will improve BERG balance test to >46 points to show reduced risk of falling Baseline: Goal status: New Pt will improve bilat leg strength to at least 4+/5 MMT in sitting to improve functional strength Baseline: Goal status: New Pt will improve Patient specific functional scale (PSFS) to at least 7/10 to show improved function level Baseline: Goal status: New Pt will reduce pain to overall less than 3/10 with usual activity and walking  Baseline: Goal status: New Pt will improve TUG time to less than 13 seconds to show improved gait speed and balance.  Baseline: Goal status: New  PLAN: PT FREQUENCY: 1-3 times per week   PT DURATION: 6-12 weeks  PLANNED INTERVENTIONS  [id number]-Therapeutic exercises, 97530- Therapeutic activity, [id number]- Neuromuscular re-education, 97535- Self Care, [id number]- Manual therapy, and Patient/Family education  PLAN FOR NEXT SESSION:  nu step, focus on balance and hip strength, gait and endurance Will  have cataract surgery on feb 16th and march 2nd so would need to miss PT that week and follow precautions   Redell JONELLE Moose, PT,DPT 06/15/2024, 5:02 PM   "

## 2024-06-20 ENCOUNTER — Ambulatory Visit: Admitting: Physical Therapy

## 2024-06-22 ENCOUNTER — Ambulatory Visit: Admitting: Physical Therapy

## 2024-07-03 ENCOUNTER — Ambulatory Visit: Admitting: Physical Therapy

## 2024-07-05 ENCOUNTER — Ambulatory Visit: Admitting: Physical Therapy

## 2024-07-10 ENCOUNTER — Ambulatory Visit: Admitting: Physical Therapy

## 2024-07-12 ENCOUNTER — Ambulatory Visit: Admitting: Physical Therapy

## 2024-07-24 ENCOUNTER — Ambulatory Visit: Admitting: Physical Therapy

## 2024-07-26 ENCOUNTER — Ambulatory Visit: Admitting: Physical Therapy

## 2024-07-31 ENCOUNTER — Ambulatory Visit: Admitting: Physical Therapy

## 2024-08-02 ENCOUNTER — Ambulatory Visit: Admitting: Physical Therapy

## 2024-08-07 ENCOUNTER — Ambulatory Visit: Admitting: Physical Therapy

## 2024-08-09 ENCOUNTER — Ambulatory Visit: Admitting: Physical Therapy
